# Patient Record
Sex: Female | Born: 1950
Health system: Southern US, Community
[De-identification: ages and names within clinical notes are randomized; demographics above are authoritative.]

## PROBLEM LIST (undated history)

## (undated) DIAGNOSIS — Z9889 Other specified postprocedural states: Secondary | ICD-10-CM

## (undated) DIAGNOSIS — IMO0002 Reserved for concepts with insufficient information to code with codable children: Secondary | ICD-10-CM

## (undated) DIAGNOSIS — K219 Gastro-esophageal reflux disease without esophagitis: Secondary | ICD-10-CM

## (undated) DIAGNOSIS — I10 Essential (primary) hypertension: Secondary | ICD-10-CM

## (undated) DIAGNOSIS — R112 Nausea with vomiting, unspecified: Secondary | ICD-10-CM

## (undated) DIAGNOSIS — D329 Benign neoplasm of meninges, unspecified: Secondary | ICD-10-CM

## (undated) DIAGNOSIS — E119 Type 2 diabetes mellitus without complications: Secondary | ICD-10-CM

## (undated) DIAGNOSIS — F32A Depression, unspecified: Secondary | ICD-10-CM

## (undated) DIAGNOSIS — M797 Fibromyalgia: Secondary | ICD-10-CM

## (undated) DIAGNOSIS — K529 Noninfective gastroenteritis and colitis, unspecified: Secondary | ICD-10-CM

## (undated) HISTORY — PX: APPENDECTOMY: SHX54

## (undated) HISTORY — DX: Gastro-esophageal reflux disease without esophagitis: K21.9

## (undated) HISTORY — PX: TONSILLECTOMY: SUR1361

## (undated) HISTORY — DX: Noninfective gastroenteritis and colitis, unspecified: K52.9

## (undated) HISTORY — PX: OTHER SURGICAL HISTORY: SHX169

## (undated) HISTORY — PX: CHOLECYSTECTOMY: SHX55

## (undated) HISTORY — DX: Reserved for concepts with insufficient information to code with codable children: IMO0002

## (undated) HISTORY — PX: BREAST SURGERY: SHX581

## (undated) HISTORY — PX: ABDOMINAL HYSTERECTOMY: SHX81

## (undated) HISTORY — PX: EYE SURGERY: SHX253

---

## 1997-04-07 HISTORY — PX: OTHER SURGICAL HISTORY: SHX169

## 2000-10-28 ENCOUNTER — Encounter: Payer: Self-pay | Admitting: Family Medicine

## 2000-10-28 ENCOUNTER — Ambulatory Visit (HOSPITAL_COMMUNITY): Admission: RE | Admit: 2000-10-28 | Discharge: 2000-10-28 | Payer: Self-pay | Admitting: Family Medicine

## 2000-12-08 ENCOUNTER — Ambulatory Visit (HOSPITAL_COMMUNITY): Admission: RE | Admit: 2000-12-08 | Discharge: 2000-12-08 | Payer: Self-pay | Admitting: Neurosurgery

## 2000-12-08 ENCOUNTER — Encounter: Payer: Self-pay | Admitting: Neurosurgery

## 2001-01-07 ENCOUNTER — Encounter: Payer: Self-pay | Admitting: Family Medicine

## 2001-01-07 ENCOUNTER — Ambulatory Visit (HOSPITAL_COMMUNITY): Admission: RE | Admit: 2001-01-07 | Discharge: 2001-01-07 | Payer: Self-pay | Admitting: Family Medicine

## 2001-02-01 ENCOUNTER — Encounter (HOSPITAL_COMMUNITY): Admission: RE | Admit: 2001-02-01 | Discharge: 2001-03-03 | Payer: Self-pay | Admitting: Orthopaedic Surgery

## 2001-11-04 ENCOUNTER — Ambulatory Visit (HOSPITAL_COMMUNITY): Admission: RE | Admit: 2001-11-04 | Discharge: 2001-11-04 | Payer: Self-pay | Admitting: Family Medicine

## 2001-11-04 ENCOUNTER — Encounter: Payer: Self-pay | Admitting: Family Medicine

## 2002-11-09 ENCOUNTER — Encounter: Payer: Self-pay | Admitting: General Surgery

## 2002-11-09 ENCOUNTER — Ambulatory Visit (HOSPITAL_COMMUNITY): Admission: RE | Admit: 2002-11-09 | Discharge: 2002-11-09 | Payer: Self-pay | Admitting: General Surgery

## 2002-11-14 ENCOUNTER — Ambulatory Visit (HOSPITAL_COMMUNITY): Admission: RE | Admit: 2002-11-14 | Discharge: 2002-11-14 | Payer: Self-pay | Admitting: Family Medicine

## 2002-11-14 ENCOUNTER — Encounter: Payer: Self-pay | Admitting: Family Medicine

## 2003-05-12 ENCOUNTER — Ambulatory Visit (HOSPITAL_COMMUNITY): Admission: RE | Admit: 2003-05-12 | Discharge: 2003-05-12 | Payer: Self-pay | Admitting: Family Medicine

## 2004-01-10 ENCOUNTER — Ambulatory Visit (HOSPITAL_COMMUNITY): Admission: RE | Admit: 2004-01-10 | Discharge: 2004-01-10 | Payer: Self-pay | Admitting: Family Medicine

## 2004-03-28 ENCOUNTER — Ambulatory Visit (HOSPITAL_COMMUNITY): Admission: RE | Admit: 2004-03-28 | Discharge: 2004-03-28 | Payer: Self-pay | Admitting: Otolaryngology

## 2004-04-07 HISTORY — PX: UPPER GASTROINTESTINAL ENDOSCOPY: SHX188

## 2004-08-27 ENCOUNTER — Ambulatory Visit: Payer: Self-pay | Admitting: Internal Medicine

## 2004-09-06 ENCOUNTER — Ambulatory Visit (HOSPITAL_COMMUNITY): Admission: RE | Admit: 2004-09-06 | Discharge: 2004-09-06 | Payer: Self-pay | Admitting: Internal Medicine

## 2004-09-06 ENCOUNTER — Ambulatory Visit: Payer: Self-pay | Admitting: Internal Medicine

## 2005-01-14 ENCOUNTER — Ambulatory Visit (HOSPITAL_COMMUNITY): Admission: RE | Admit: 2005-01-14 | Discharge: 2005-01-14 | Payer: Self-pay | Admitting: Family Medicine

## 2006-02-23 ENCOUNTER — Ambulatory Visit (HOSPITAL_COMMUNITY): Admission: RE | Admit: 2006-02-23 | Discharge: 2006-02-23 | Payer: Self-pay | Admitting: Family Medicine

## 2007-02-25 ENCOUNTER — Ambulatory Visit (HOSPITAL_COMMUNITY): Admission: RE | Admit: 2007-02-25 | Discharge: 2007-02-25 | Payer: Self-pay | Admitting: Family Medicine

## 2007-03-01 ENCOUNTER — Ambulatory Visit (HOSPITAL_COMMUNITY): Admission: RE | Admit: 2007-03-01 | Discharge: 2007-03-01 | Payer: Self-pay | Admitting: Family Medicine

## 2007-05-05 ENCOUNTER — Ambulatory Visit (HOSPITAL_COMMUNITY): Admission: RE | Admit: 2007-05-05 | Discharge: 2007-05-05 | Payer: Self-pay | Admitting: Family Medicine

## 2007-08-13 ENCOUNTER — Ambulatory Visit (HOSPITAL_COMMUNITY): Admission: RE | Admit: 2007-08-13 | Discharge: 2007-08-13 | Payer: Self-pay | Admitting: Family Medicine

## 2007-11-11 HISTORY — PX: COLONOSCOPY: SHX174

## 2008-03-22 ENCOUNTER — Ambulatory Visit (HOSPITAL_COMMUNITY): Admission: RE | Admit: 2008-03-22 | Discharge: 2008-03-22 | Payer: Self-pay | Admitting: Family Medicine

## 2008-05-24 ENCOUNTER — Encounter: Admission: RE | Admit: 2008-05-24 | Discharge: 2008-05-24 | Payer: Self-pay | Admitting: Neurosurgery

## 2009-05-03 ENCOUNTER — Ambulatory Visit (HOSPITAL_COMMUNITY): Admission: RE | Admit: 2009-05-03 | Discharge: 2009-05-03 | Payer: Self-pay | Admitting: Otolaryngology

## 2009-05-07 ENCOUNTER — Ambulatory Visit (HOSPITAL_COMMUNITY): Admission: RE | Admit: 2009-05-07 | Discharge: 2009-05-07 | Payer: Self-pay | Admitting: Family Medicine

## 2009-05-22 ENCOUNTER — Encounter: Admission: RE | Admit: 2009-05-22 | Discharge: 2009-05-22 | Payer: Self-pay | Admitting: Neurosurgery

## 2010-04-28 ENCOUNTER — Encounter: Payer: Self-pay | Admitting: Family Medicine

## 2010-05-06 ENCOUNTER — Other Ambulatory Visit: Payer: Self-pay | Admitting: Neurosurgery

## 2010-05-06 DIAGNOSIS — D329 Benign neoplasm of meninges, unspecified: Secondary | ICD-10-CM

## 2010-05-10 ENCOUNTER — Other Ambulatory Visit: Payer: Self-pay

## 2010-05-15 ENCOUNTER — Ambulatory Visit
Admission: RE | Admit: 2010-05-15 | Discharge: 2010-05-15 | Disposition: A | Discharge status: Home / Self Care | Payer: BC Managed Care – PPO | Source: Ambulatory Visit | Attending: Neurosurgery | Admitting: Neurosurgery

## 2010-05-15 ENCOUNTER — Other Ambulatory Visit: Payer: Self-pay | Admitting: Neurosurgery

## 2010-05-15 DIAGNOSIS — D329 Benign neoplasm of meninges, unspecified: Secondary | ICD-10-CM

## 2010-05-15 MED ORDER — GADOBENATE DIMEGLUMINE 529 MG/ML IV SOLN
17.0000 mL | Freq: Once | INTRAVENOUS | Status: AC | PRN
  Administered 2010-05-15: 17 mL via INTRAVENOUS

## 2010-05-30 ENCOUNTER — Other Ambulatory Visit (HOSPITAL_COMMUNITY): Payer: Self-pay | Admitting: Family Medicine

## 2010-05-30 DIAGNOSIS — Z139 Encounter for screening, unspecified: Secondary | ICD-10-CM

## 2010-06-03 ENCOUNTER — Ambulatory Visit (HOSPITAL_COMMUNITY): Payer: BC Managed Care – PPO

## 2010-06-17 ENCOUNTER — Other Ambulatory Visit (HOSPITAL_COMMUNITY): Payer: Self-pay | Admitting: Family Medicine

## 2010-06-17 ENCOUNTER — Ambulatory Visit (HOSPITAL_COMMUNITY)
Admission: RE | Admit: 2010-06-17 | Discharge: 2010-06-17 | Disposition: A | Discharge status: Home / Self Care | Payer: BC Managed Care – PPO | Source: Ambulatory Visit | Attending: Family Medicine | Admitting: Family Medicine

## 2010-06-17 ENCOUNTER — Encounter (HOSPITAL_COMMUNITY): Payer: Self-pay

## 2010-06-17 DIAGNOSIS — R0789 Other chest pain: Secondary | ICD-10-CM | POA: Insufficient documentation

## 2010-06-19 ENCOUNTER — Other Ambulatory Visit (HOSPITAL_COMMUNITY): Payer: Self-pay | Admitting: Family Medicine

## 2010-06-19 DIAGNOSIS — R110 Nausea: Secondary | ICD-10-CM

## 2010-06-19 DIAGNOSIS — R197 Diarrhea, unspecified: Secondary | ICD-10-CM

## 2010-06-20 ENCOUNTER — Other Ambulatory Visit (HOSPITAL_COMMUNITY): Payer: Self-pay | Admitting: Family Medicine

## 2010-06-20 DIAGNOSIS — Z139 Encounter for screening, unspecified: Secondary | ICD-10-CM

## 2010-06-20 LAB — CBC AND DIFFERENTIAL
HCT: 40 % (ref 36–46)
Hemoglobin: 13 g/dL (ref 12.0–16.0)
Platelets: 324 10*3/uL (ref 150–399)
WBC: 6 10^3/mL

## 2010-06-20 LAB — BASIC METABOLIC PANEL
Glucose: 116 mg/dL
Potassium: 4.6 mmol/L (ref 3.4–5.3)
Sodium: 139 mmol/L (ref 137–147)

## 2010-06-20 LAB — HEPATIC FUNCTION PANEL
ALT: 10 U/L (ref 7–35)
AST: 11 U/L — AB (ref 13–35)

## 2010-06-21 ENCOUNTER — Ambulatory Visit (HOSPITAL_COMMUNITY)
Admission: RE | Admit: 2010-06-21 | Discharge: 2010-06-21 | Disposition: A | Discharge status: Home / Self Care | Payer: BC Managed Care – PPO | Source: Ambulatory Visit | Attending: Family Medicine | Admitting: Family Medicine

## 2010-06-21 DIAGNOSIS — K5730 Diverticulosis of large intestine without perforation or abscess without bleeding: Secondary | ICD-10-CM | POA: Insufficient documentation

## 2010-06-21 DIAGNOSIS — R110 Nausea: Secondary | ICD-10-CM

## 2010-06-21 DIAGNOSIS — R109 Unspecified abdominal pain: Secondary | ICD-10-CM | POA: Insufficient documentation

## 2010-06-21 DIAGNOSIS — R197 Diarrhea, unspecified: Secondary | ICD-10-CM

## 2010-06-21 MED ORDER — IOHEXOL 300 MG/ML  SOLN
100.0000 mL | Freq: Once | INTRAMUSCULAR | Status: AC | PRN
  Administered 2010-06-21: 100 mL via INTRAVENOUS

## 2010-06-24 ENCOUNTER — Ambulatory Visit (HOSPITAL_COMMUNITY)
Admission: RE | Admit: 2010-06-24 | Discharge: 2010-06-24 | Disposition: A | Discharge status: Home / Self Care | Payer: BC Managed Care – PPO | Source: Ambulatory Visit | Attending: Family Medicine | Admitting: Family Medicine

## 2010-06-24 DIAGNOSIS — Z139 Encounter for screening, unspecified: Secondary | ICD-10-CM

## 2010-06-24 DIAGNOSIS — Z1231 Encounter for screening mammogram for malignant neoplasm of breast: Secondary | ICD-10-CM | POA: Insufficient documentation

## 2010-07-25 ENCOUNTER — Ambulatory Visit (INDEPENDENT_AMBULATORY_CARE_PROVIDER_SITE_OTHER): Payer: BC Managed Care – PPO | Admitting: Internal Medicine

## 2010-07-25 DIAGNOSIS — K219 Gastro-esophageal reflux disease without esophagitis: Secondary | ICD-10-CM

## 2010-08-23 NOTE — Op Note (Signed)
NAME:  Mindy Ellis, Mindy Ellis               ACCOUNT NO.:  [id number]   MEDICAL RECORD NO.:  [id number]          PATIENT TYPE:  AMB   LOCATION:  DAY                           FACILITY:  [facility]   PHYSICIAN:  Lionel December, M.D.    DATE OF BIRTH:  Jan 11, 1951   DATE OF PROCEDURE:  09/06/2004  DATE OF DISCHARGE:                                 OPERATIVE REPORT   PROCEDURE:  Esophagogastroduodenoscopy with esophageal dilation followed by  colonoscopy.   INDICATION:  Carlisia is a 60 year old Caucasian female with a history of GERD  who had laparoscopic Nissen in September 1997 who presents with recurrent  burning in her chest as well as dysphagia. She has had dysphagia off and on  for a year. She also complains of intermittent rectal bleeding. Her last  colonoscopy was in February 2002.   She is undergoing diagnostic evaluation. Procedure risks were reviewed with  the patient, and informed consent was obtained.   PREMEDICATION:  Cetacaine spray for pharyngeal topical anesthesia, Demerol  50 mg IV, Versed 10 mg IV.   FINDINGS:  Procedure performed in endoscopy suite. The patient's vital signs  and O2 saturation were monitored during the procedure and remained stable.   PROCEDURE:  #1.  Esophagogastroduodenoscopy. The patient was placed in left  lateral position and Olympus videoscope was passed via oropharynx without  any difficulty into esophagus.   Esophagus. Mucosa of the esophagus normal. No ring or stricture or erosions  were noted.   Stomach. It was empty and distended very well insufflated. Folds of proximal  stomach were normal. Examination of mucosa revealed multiple  prepyloric/antral erosions with erythematous mucosa surrounding these.  Pyloric channel was patent. Angularis, fundus and cardia examined by  retroflexing the scope and were normal. Fundal wrap was rather short.   Duodenum. Bulbar mucosa was normal. Scope was passed to the second part of  the duodenum where mucosa and  folds were normal. Endoscope was withdrawn.   Esophagus was dilated by passing 56-French Maloney dilator to full  insertion. As the dilator was withdrawn, endoscope was passed again, and  there was no mucosal injury to esophagus. Endoscope was withdrawn and the  patient prepared for procedure #2.   #2.  Colonoscopy. Rectal examination performed. No abnormality noted on  external or digital exam. Olympus videoscope was placed in rectum and  advanced under vision into sigmoid colon beyond. Preparation was excellent.  Scope was passed to the cecum which was identified by appendiceal orifice  and ileocecal valve. Pictures taken for the record. As the scope was  withdrawn, colonic mucosa was once again carefully examined and was normal  throughout. Rectal mucosa was similarly normal. Scope was retroflexed to  examine anorectal junction. Hemorrhoids were noted below the dentate line.  Pictures taken for the record, and endoscope was withdrawn. The patient  tolerated the procedure well.   FINAL DIAGNOSIS:  1.  No endoscopic evidence of reflux, esophagitis, ring, or stricture      formation. Esophagus dilated by passing 56-French Maloney dilator given      history of dysphagia.  2.  Short fundal  wrap.  3.  Erosive antral gastritis which was new since previous exam of August      1997.  4.  Normal colonoscopy except external hemorrhoids.   RECOMMENDATIONS:  1.  Antireflux measures.  2.  Trial with omeprazole 20 mg p.o. q.a.m.  3.  H pylori serology will be checked.       NR/MEDQ  D:  09/06/2004  T:  09/07/2004  Job:  [id number]   cc:   Kirk Ruths, M.D.  [address]  [place]  [place] [address]  Fax: [phone]

## 2010-08-23 NOTE — H&P (Signed)
NAME:  TWYLA, DAIS               ACCOUNT NO.:  [id number]   MEDICAL RECORD NO.:  [id number]          PATIENT TYPE:  AMB   LOCATION:  DAY                           FACILITY:  [facility]   PHYSICIAN:  Lionel December, M.D.    DATE OF BIRTH:  March 18, 1951   DATE OF ADMISSION:  DATE OF DISCHARGE:  LH                                HISTORY & PHYSICAL   PRIMARY CARE PHYSICIAN:  Dr. Regino Schultze.   REASON FOR CONSULTATION:  Rectal bleeding and anorexia, early satiety.   HISTORY OF PRESENT ILLNESS:  Mrs. Schmale is a 60 year old Caucasian female,  a patient of Dr. Edison Simon, who presents today with multiple GI concerns.  First, she describes intermittent rectal bleeding or what she believes to be  intermittent rectal bleeding.  She notes she saw red blood streaks in her  commode on two separate occasions.  She did not notice this after having a  bowel movement, but when she returned to her house later that day she saw  blood in the toilet.  She did not notice any blood on the toilet paper or in  her stools.  She describes what she saw as bright red.  She denies any low  abdominal pain.  She also has a history of chronic GERD.  She reportedly has  had several episodes of dysphagia and choking, especially on solid foods  including spicy foods and peanuts.  She denies any problems with liquids.  She has been having solid food dysphagia for about a year now.  She also  reports early satiety for the last 9 months.  She denies any constipation or  diarrhea.  She has had significant water brash, although she is status post  Nissen fundoplication.  She denies any odynophagia.  She denies any nausea  or vomiting.  She was noted to be anemic.  She does take an occasional Aleve  and has had to take Zantac on an as-needed basis.   PAST MEDICAL HISTORY:  Rosacea, chronic GERD, status post Nissen  fundoplication in September of 1997.  She had a hysterectomy at age 60  followed by a bilateral oophorectomy 7 years  later.  She had a laparoscopic  cholecystectomy in March of 1994.  She has right-sided hearing loss and  mitral valve prolapse.  Total colonoscopy by Dr. Karilyn Cota on May 11, 2000.  She has prominent anal papilla.  Otherwise normal exam.  Also she had an EGD  in August of 1997 which showed mild changes of reflux esophagitis without  evidence of gastritis or peptic ulcer disease.  There was no evidence of  Barrett's.   CURRENT MEDICATIONS:  1.  Ranitidine 75 mg p.r.n.  2.  Multivitamin daily.  3.  Vivelle patch.  4.  Amitriptyline 25 mg daily.  5.  Aleve p.r.n.   ALLERGIES:  SULFA, PENICILLIN, VIOXX, ERYTHROMYCIN, EPIDURAL, TORECAN.   FAMILY HISTORY:  No known family history of colorectal carcinoma, liver or  chronic GI problems.  Mother with a history of diabetes mellitus at age 30.  She has a sister who is relatively healthy.   SOCIAL HISTORY:  Mrs. Dorman has been married for 37 years.  She has two  healthy sons.  She is employed full time at [employer].  She denies any tobacco,  alcohol or drug use.   REVIEW OF SYSTEMS:  CONSTITUTIONAL:  She has changed to a more sedentary  job.  Therefore she reports about a 10-pound weight gain in the last 6  months, although she complains of anorexia.  She denies any fever or chills.  ENDOCRINE:  She notes hair loss and increasing abdominal girth.  She states  her extremities are always cold.  GYN:  She is status post hysterectomy.  PSYCHOSOCIAL:  She denies any problems with depression currently, although  she is somewhat stressed with her husband with PTSD.  She denies any  suicidal or homicidal ideation.  GI:  See HPI.   PHYSICAL EXAMINATION:  VITAL SIGNS:  Weight 118.5 pounds.  Height 64 inches.  Temperature 98.6, blood pressure 114/76, pulse 68.  GENERAL:  Mrs. Prim is a 60 year old Caucasian female who is alert,  oriented, pleasant and cooperative, in no acute distress.  HEENT:  Sclerae are clear and nonicteric.  Conjunctivae pink.   Oropharynx is  pink and moist without any lesions.  NECK:  Supple without any masses or thyromegaly.  CHEST:  Heart has regular rate and rhythm with normal S1, S2.  She did have  a 1 out of 6 murmur noted.  LUNGS:  Clear to auscultation bilaterally.  ABDOMEN:  Flat with positive bowel sounds times four.  No bruit auscultated.  Soft, nontender, nondistended without palpable masses or hepatosplenomegaly.  No rebound tenderness or guarding.  RECTAL:  She does have a friable anal canal with a few external hemorrhoids.  No active bleeding.  Internal exam reveals good sphincter tone.  No internal  mass is palpated.  A small amount of formed stool was obtained from the  vault which is light brown and hemoccult negative.  EXTREMITIES:  Without edema bilaterally.  SKIN:  Pink, warm and dry without any rash or jaundice.   LABORATORY STUDIES:  Vitamin B12 473, folate 11.6, iron 90, TIBC 463, TIBC  percent sat 19, ferritin 14, WBC 7.3, hemoglobin 12.2, hematocrit 41.4,  platelets 461.  Prior CBC from May 21, 2004 revealed a hemoglobin of  11.5 with an MCV of 76.5.  CMP was normal.   ASSESSMENT:  Mrs. Flaum is a 60 year old Caucasian female with multiple GI  complaints today.  1.  Noticing bright red streaks in the toilet which she felt were blood but      without seeing any outright hematochezia.  She does have external      hemorrhoids on exam and could be having hemorrhoidal bleeding.      Colonoscopy was reassuring from 2002.  We will treat her external      hemorrhoids.  However, if she continues to have bleeding, she may need      further evaluation.  2.  As far as her early satiety and intermittent solid food dysphagia is      concerned, she should have an evaluation of her upper GI tract with EGD.      She is status post Nissen fundoplication.  Therefore we should verify      this is intact, given her exacerbation of symptoms as well as to rule     out any complications including  stricture.  As far as her weight gain is      concerned, she would like [place] to check her  TSH today to be sure she does      not have hypothyroidism, and I feel that is appropriate.  3.  She was found to have anemia without evidence of iron deficiency.  This      has resolved as of last CBC from July 24, 2004.  Her hemoglobin was      12.2 with a hematocrit of 41.1 and MCV of 78.7.  She does, however, have      thrombocytosis, which will be rechecked by Dr. Edison Simon office within      the next week or so.   RECOMMENDATIONS:  1.  Prescription was given for Anusol HC cream. She is to apply it b.i.d. to      her rectum for 10 days with no refills.  2.  No further NSAIDs.  3.  TSH will be obtained.  4.  We will schedule an EGD with Dr. Karilyn Cota in the near future.  She will be      given SBE prophylaxis.  If she      continues to have rectal bleeding she is going to need further      evaluation as well.  EGD has been discussed with Mrs. Buster including      risks and benefits to include but not limited to bleeding, infection,      perforation, drug reaction.  She agrees with the plan.  Consent will be      obtained.      KC/MEDQ  D:  08/27/2004  T:  08/27/2004  Job:  [id number]   cc:   Kirk Ruths, M.D.  [address]  [place]  [place] [address]  Fax: [phone]

## 2010-12-18 ENCOUNTER — Encounter (INDEPENDENT_AMBULATORY_CARE_PROVIDER_SITE_OTHER): Payer: Self-pay

## 2011-05-19 ENCOUNTER — Other Ambulatory Visit: Payer: Self-pay | Admitting: Neurosurgery

## 2011-05-19 DIAGNOSIS — R51 Headache: Secondary | ICD-10-CM

## 2011-05-23 ENCOUNTER — Ambulatory Visit
Admission: RE | Admit: 2011-05-23 | Discharge: 2011-05-23 | Disposition: A | Discharge status: Home / Self Care | Payer: BC Managed Care – PPO | Source: Ambulatory Visit | Attending: Neurosurgery | Admitting: Neurosurgery

## 2011-05-23 DIAGNOSIS — R51 Headache: Secondary | ICD-10-CM

## 2011-05-23 MED ORDER — GADOBENATE DIMEGLUMINE 529 MG/ML IV SOLN
17.0000 mL | Freq: Once | INTRAVENOUS | Status: AC | PRN
  Administered 2011-05-23: 17 mL via INTRAVENOUS

## 2011-06-05 ENCOUNTER — Other Ambulatory Visit (HOSPITAL_COMMUNITY): Payer: Self-pay | Admitting: Family Medicine

## 2011-06-05 DIAGNOSIS — Z139 Encounter for screening, unspecified: Secondary | ICD-10-CM

## 2011-06-22 ENCOUNTER — Emergency Department (HOSPITAL_COMMUNITY)
Admission: EM | Admit: 2011-06-22 | Discharge: 2011-06-22 | Disposition: A | Discharge status: Home / Self Care | Payer: BC Managed Care – PPO | Attending: Emergency Medicine | Admitting: Emergency Medicine

## 2011-06-22 ENCOUNTER — Encounter (HOSPITAL_COMMUNITY): Payer: Self-pay | Admitting: *Deleted

## 2011-06-22 DIAGNOSIS — IMO0002 Reserved for concepts with insufficient information to code with codable children: Secondary | ICD-10-CM | POA: Insufficient documentation

## 2011-06-22 DIAGNOSIS — R197 Diarrhea, unspecified: Secondary | ICD-10-CM | POA: Insufficient documentation

## 2011-06-22 DIAGNOSIS — L299 Pruritus, unspecified: Secondary | ICD-10-CM | POA: Insufficient documentation

## 2011-06-22 DIAGNOSIS — Z79899 Other long term (current) drug therapy: Secondary | ICD-10-CM | POA: Insufficient documentation

## 2011-06-22 DIAGNOSIS — K219 Gastro-esophageal reflux disease without esophagitis: Secondary | ICD-10-CM | POA: Insufficient documentation

## 2011-06-22 DIAGNOSIS — L509 Urticaria, unspecified: Secondary | ICD-10-CM | POA: Insufficient documentation

## 2011-06-22 MED ORDER — EPINEPHRINE 0.3 MG/0.3ML IJ DEVI
0.3000 mg | Freq: Once | INTRAMUSCULAR | Status: AC
  Administered 2011-06-22: 0.3 mg via SUBCUTANEOUS
  Filled 2011-06-22: qty 0.3

## 2011-06-22 MED ORDER — PREDNISONE 10 MG PO TABS: 20.0000 mg | ORAL_TABLET | Freq: Two times a day (BID) | ORAL | Status: DC

## 2011-06-22 MED ORDER — HYDROXYZINE HCL 25 MG PO TABS
25.0000 mg | ORAL_TABLET | Freq: Once | ORAL | Status: AC
Start: 2011-06-22 — End: 2011-06-22
  Administered 2011-06-22: 25 mg via ORAL
  Filled 2011-06-22: qty 1

## 2011-06-22 MED ORDER — PREDNISONE 20 MG PO TABS
20.0000 mg | ORAL_TABLET | Freq: Once | ORAL | Status: AC
Start: 2011-06-22 — End: 2011-06-22
  Administered 2011-06-22: 20 mg via ORAL
  Filled 2011-06-22: qty 1

## 2011-06-22 MED ORDER — HYDROXYZINE HCL 25 MG PO TABS: 25.0000 mg | ORAL_TABLET | Freq: Four times a day (QID) | ORAL | Status: AC | PRN

## 2011-06-22 NOTE — ED Notes (Signed)
Reports having rash for several days, is all over body, red, mild itching and pin prick type pain with the rash. Has been to pcp for it on Friday, no relief with benadryl.

## 2011-06-22 NOTE — ED Provider Notes (Signed)
History     CSN: [id number]  Arrival date & time 06/22/11  1101   First MD Initiated Contact with Patient 06/22/11 1149      Chief Complaint  Patient presents with  . Rash    (Consider location/radiation/quality/duration/timing/severity/associated sxs/prior treatment) HPI Comments: Was started on macrobid recently, but this started before the antibiotic was prescribed.  Patient is a 61 y.o. female presenting with rash. The history is provided by the patient.  Rash  This is a new problem. Episode onset: two days ago. The problem has been gradually worsening. The problem is associated with nothing. There has been no fever. Affected Location: all over. The pain has been constant since onset. Associated symptoms include itching. Pertinent negatives include no blisters and no pain. She has tried antihistamines for the symptoms. The treatment provided no relief.    Past Medical History  Diagnosis Date  . GERD (gastroesophageal reflux disease)   . Acid reflux   . Dysphagia   . Chronic diarrhea   . Abdominal pain   . DDD (degenerative disc disease)   . Sleep apnea     Past Surgical History  Procedure Date  . Colonoscopy 11/11/07    NUR  . Upper gastrointestinal endoscopy 2006    History reviewed. No pertinent family history.  History  Substance Use Topics  . Smoking status: Never Smoker   . Smokeless tobacco: Not on file  . Alcohol Use: Yes     occ wine    OB History    Grav Para Term Preterm Abortions TAB SAB Ect Mult Living                  Review of Systems  Skin: Positive for itching and rash.  All other systems reviewed and are negative.    Allergies  Aleve; Ambien; Erythromycin; Mirtazapine; Penicillins; Sulfur; and Tetracyclines & related  Home Medications   Current Outpatient Rx  Name Route Sig Dispense Refill  . ALPRAZOLAM 0.25 MG PO TABS Oral Take 0.25 mg by mouth at bedtime as needed. For sleep    . OMEGA-3 FATTY ACIDS 1000 MG PO CAPS Oral Take 3  g by mouth daily.    Marland Kitchen LORATADINE 10 MG PO TABS Oral Take 10 mg by mouth daily.    . TRAZODONE HCL 100 MG PO TABS Oral Take 100 mg by mouth at bedtime.        BP 131/85  Pulse 95  Temp(Src) 98.9 F (37.2 C) (Oral)  Resp 20  SpO2 96%  Physical Exam  Nursing note and vitals reviewed. Constitutional: She is oriented to person, place, and time. She appears well-developed and well-nourished. No distress.  HENT:  Head: Normocephalic and atraumatic.  Neck: Normal range of motion. Neck supple.  Cardiovascular: Normal rate and regular rhythm.  Exam reveals no gallop and no friction rub.   No murmur heard. Pulmonary/Chest: Effort normal and breath sounds normal. No respiratory distress. She has no wheezes.  Abdominal: Soft. Bowel sounds are normal. She exhibits no distension. There is no tenderness.  Musculoskeletal: Normal range of motion.  Neurological: She is alert and oriented to person, place, and time.  Skin: Skin is warm and dry. She is not diaphoretic.       There is a generalized macular rash present.  It is blanching, raised, pruritic.    ED Course  Procedures (including critical care time)  Labs Reviewed - No data to display No results found.   No diagnosis found.  MDM  Some relief with meds here.  Will prescribe atarax, prednisone, give time.  To return prn.        Geoffery Lyons, MD 06/22/11 1309

## 2011-06-22 NOTE — Discharge Instructions (Signed)

## 2011-06-30 ENCOUNTER — Ambulatory Visit (HOSPITAL_COMMUNITY)
Admission: RE | Admit: 2011-06-30 | Discharge: 2011-06-30 | Disposition: A | Discharge status: Home / Self Care | Payer: BC Managed Care – PPO | Source: Ambulatory Visit | Attending: Family Medicine | Admitting: Family Medicine

## 2011-06-30 DIAGNOSIS — Z1231 Encounter for screening mammogram for malignant neoplasm of breast: Secondary | ICD-10-CM | POA: Insufficient documentation

## 2011-06-30 DIAGNOSIS — Z139 Encounter for screening, unspecified: Secondary | ICD-10-CM

## 2011-12-19 ENCOUNTER — Emergency Department (HOSPITAL_COMMUNITY)
Admission: EM | Admit: 2011-12-19 | Discharge: 2011-12-19 | Disposition: A | Discharge status: Home / Self Care | Payer: Worker's Compensation | Attending: Emergency Medicine | Admitting: Emergency Medicine

## 2011-12-19 ENCOUNTER — Encounter (HOSPITAL_COMMUNITY): Payer: Self-pay | Admitting: *Deleted

## 2011-12-19 ENCOUNTER — Emergency Department (HOSPITAL_COMMUNITY): Payer: Worker's Compensation

## 2011-12-19 DIAGNOSIS — S52121A Displaced fracture of head of right radius, initial encounter for closed fracture: Secondary | ICD-10-CM

## 2011-12-19 DIAGNOSIS — W010XXA Fall on same level from slipping, tripping and stumbling without subsequent striking against object, initial encounter: Secondary | ICD-10-CM | POA: Insufficient documentation

## 2011-12-19 DIAGNOSIS — S52123A Displaced fracture of head of unspecified radius, initial encounter for closed fracture: Secondary | ICD-10-CM | POA: Insufficient documentation

## 2011-12-19 DIAGNOSIS — IMO0002 Reserved for concepts with insufficient information to code with codable children: Secondary | ICD-10-CM | POA: Insufficient documentation

## 2011-12-19 DIAGNOSIS — S0083XA Contusion of other part of head, initial encounter: Secondary | ICD-10-CM

## 2011-12-19 DIAGNOSIS — Y998 Other external cause status: Secondary | ICD-10-CM | POA: Insufficient documentation

## 2011-12-19 DIAGNOSIS — G4730 Sleep apnea, unspecified: Secondary | ICD-10-CM | POA: Insufficient documentation

## 2011-12-19 DIAGNOSIS — S0003XA Contusion of scalp, initial encounter: Secondary | ICD-10-CM | POA: Insufficient documentation

## 2011-12-19 DIAGNOSIS — S0990XA Unspecified injury of head, initial encounter: Secondary | ICD-10-CM | POA: Insufficient documentation

## 2011-12-19 DIAGNOSIS — K219 Gastro-esophageal reflux disease without esophagitis: Secondary | ICD-10-CM | POA: Insufficient documentation

## 2011-12-19 DIAGNOSIS — Y9301 Activity, walking, marching and hiking: Secondary | ICD-10-CM | POA: Insufficient documentation

## 2011-12-19 MED ORDER — HYDROCODONE-ACETAMINOPHEN 5-325 MG PO TABS
1.0000 | ORAL_TABLET | Freq: Once | ORAL | Status: AC
  Administered 2011-12-19: 1 via ORAL

## 2011-12-19 MED ORDER — HYDROCODONE-ACETAMINOPHEN 5-325 MG PO TABS
ORAL_TABLET | ORAL | Status: AC
  Filled 2011-12-19: qty 1

## 2011-12-19 MED ORDER — HYDROCODONE-ACETAMINOPHEN 5-325 MG PO TABS: ORAL_TABLET | ORAL | Status: DC

## 2011-12-19 NOTE — ED Provider Notes (Signed)
History     CSN: [id number]  Arrival date & time 12/19/11  1430   First MD Initiated Contact with Patient 12/19/11 1609      Chief Complaint  Patient presents with  . Fall    (Consider location/radiation/quality/duration/timing/severity/associated sxs/prior treatment) HPI Comments: Slipped on wet pine needles and fell injuring R elbow and striking head on asphalt.  "don't know if i was knocked out.  i don't think so"  Fall unwitnessed.  No neck pain.  No anticoagulant use.  No other injuries or complaints.  Patient is a 61 y.o. female presenting with fall. The history is provided by the patient. No language interpreter was used.  Fall The accident occurred 1 to 2 hours ago. The fall occurred while walking. She landed on concrete. The pain is present in the head and right elbow. The pain is at a severity of 7/10. She was ambulatory at the scene. There was no entrapment after the fall. There was no drug use involved in the accident. There was no alcohol use involved in the accident. Pertinent negatives include no nausea, no vomiting and no headaches. She has tried nothing for the symptoms.    Past Medical History  Diagnosis Date  . GERD (gastroesophageal reflux disease)   . Acid reflux   . Dysphagia   . Chronic diarrhea   . Abdominal pain   . DDD (degenerative disc disease)   . Sleep apnea     Past Surgical History  Procedure Date  . Colonoscopy 11/11/07    NUR  . Upper gastrointestinal endoscopy 2006  . Abdominal hysterectomy   . Appendectomy   . Breast surgery   . Surgery  for acid reflux   . Eye surgery   . Tonsillectomy   . Cholecystectomy     History reviewed. No pertinent family history.  History  Substance Use Topics  . Smoking status: Never Smoker   . Smokeless tobacco: Not on file  . Alcohol Use: Yes     occ wine    OB History    Grav Para Term Preterm Abortions TAB SAB Ect Mult Living                  Review of Systems  HENT: Negative for neck  pain, tinnitus and ear discharge.   Gastrointestinal: Negative for nausea and vomiting.  Musculoskeletal:       Elbow injury   Neurological: Negative for headaches.  Hematological: Does not bruise/bleed easily.  All other systems reviewed and are negative.    Allergies  Erythromycin; Mirtazapine; Naproxen sodium; Penicillins; Sulfur; Tetracyclines & related; and Zolpidem tartrate  Home Medications   Current Outpatient Rx  Name Route Sig Dispense Refill  . ALPRAZOLAM 0.25 MG PO TABS Oral Take 0.25 mg by mouth at bedtime as needed. For sleep    . OMEGA-3 FATTY ACIDS 1000 MG PO CAPS Oral Take 3 g by mouth daily.    Marland Kitchen HYDROCODONE-ACETAMINOPHEN 5-325 MG PO TABS  One tab po q 4-6 hrs prn pain 20 tablet 0  . LORATADINE 10 MG PO TABS Oral Take 10 mg by mouth daily.    Marland Kitchen PREDNISONE 10 MG PO TABS Oral Take 2 tablets (20 mg total) by mouth 2 (two) times daily. 12 tablet 0  . TRAZODONE HCL 100 MG PO TABS Oral Take 100 mg by mouth at bedtime.        BP 124/77  Pulse 92  Temp 98.4 F (36.9 C) (Oral)  Ht 5\' 4"  (1.626  m)  Wt 168 lb (76.204 kg)  BMI 28.84 kg/m2  SpO2 100%  Physical Exam  Nursing note and vitals reviewed. Constitutional: She is oriented to person, place, and time. Vital signs are normal. She appears well-developed and well-nourished. She is cooperative.  Non-toxic appearance. She does not have a sickly appearance. She does not appear ill. No distress.  HENT:  Head: Normocephalic and atraumatic.    Right Ear: Hearing, tympanic membrane, external ear and ear canal normal. No hemotympanum.  Left Ear: Hearing, tympanic membrane, external ear and ear canal normal. No hemotympanum.  Eyes: Conjunctivae normal, EOM and lids are normal. Pupils are equal, round, and reactive to light. Right eye exhibits normal extraocular motion and no nystagmus. Left eye exhibits normal extraocular motion and no nystagmus.  Neck: Normal range of motion. No spinous process tenderness and no muscular  tenderness present. Normal range of motion present.  Cardiovascular: Normal rate, regular rhythm and normal heart sounds.   Pulmonary/Chest: Effort normal and breath sounds normal.  Abdominal: Soft. She exhibits no distension. There is no tenderness.  Musculoskeletal:       Right elbow: She exhibits decreased range of motion. She exhibits no swelling, no effusion and no deformity. tenderness found. Radial head tenderness noted.  Neurological: She is alert and oriented to person, place, and time. No cranial nerve deficit. Coordination normal.  Skin: Skin is warm and dry.  Psychiatric: She has a normal mood and affect. Judgment normal.    ED Course  Procedures (including critical care time)  Labs Reviewed - No data to display Dg Elbow Complete Right  12/19/2011  *RADIOLOGY REPORT*  Clinical Data: Fall, pain  RIGHT ELBOW - COMPLETE 3+ VIEW  Comparison: None.  Findings: Nondisplaced radial head fracture is present. A joint effusion is noted.  Distal humerus, proximal ulna are intact.  IMPRESSION: Nondisplaced radial head fracture.   Original Report Authenticated By: Elsie Stain, M.D.    Dg Wrist Complete Right  12/19/2011  *RADIOLOGY REPORT*  Clinical Data: Larey Seat, pain  RIGHT WRIST - COMPLETE 3+ VIEW  Comparison: None.  Findings: There is no evidence of fracture or dislocation.  There is no evidence of arthropathy or other focal bony abnormality. Soft tissues are unremarkable.  IMPRESSION: No acute findings.   Original Report Authenticated By: Elsie Stain, M.D.    Ct Head Wo Contrast  12/19/2011  *RADIOLOGY REPORT*  Clinical Data: Fall and hit forehead.  CT HEAD WITHOUT CONTRAST  Technique:  Contiguous axial images were obtained from the base of the skull through the vertex without contrast.  Comparison: Brain MR 05/23/2011  Findings: There is a partially calcified dural based lesion in the left frontal parasagittal region.  Based on the previous MRI, this is consistent with a meningioma.   Again seen are small areas of fat along the falx.  There is no evidence for acute hemorrhage, midline shift, hydrocephalus or large infarct.  The paranasal sinuses and visualized mastoid air cells are clear.  No evidence for acute fracture.  IMPRESSION: No acute intracranial abnormality.  Left parasagittal meningioma.   Original Report Authenticated By: Richarda Overlie, M.D.      1. Forehead contusion   2. Head trauma   3. Right radial head fracture       MDM  Sugar tong splint Ice, sling rx-hydrocodone, 20 F/u with dr. Stana Bunting, PA 12/19/11 1715

## 2011-12-19 NOTE — ED Notes (Signed)
Pt stated she slipped and  Larey Seat today while walking at work. Has abrasions to bil arms, bil knees and forehead.   Has limited movement to right elbow, swelling noted. Unsure of loc, stated that "its all a blurr", denies any neck, or back pain. Alert and oriented, able to answer all ?'s.

## 2011-12-19 NOTE — ED Notes (Signed)
Slipped on sidewalk, No LOC, contusion to forehead, abrasions to both arms and rt knee.  Pain rt forearm.No neck pain.  Good rt radial pulse.

## 2011-12-19 NOTE — ED Notes (Signed)
Pt given graham crackers and diet coke, also paper scrub top to wear home. Awaiting ride to home.

## 2011-12-20 NOTE — ED Provider Notes (Signed)
Medical screening examination/treatment/procedure(s) were performed by non-physician practitioner and as supervising physician I was immediately available for consultation/collaboration.  Tobin Chad, MD 12/20/11 [phone]

## 2012-02-25 ENCOUNTER — Ambulatory Visit (HOSPITAL_COMMUNITY): Payer: Self-pay | Admitting: Specialist

## 2012-03-03 ENCOUNTER — Other Ambulatory Visit (HOSPITAL_COMMUNITY): Payer: Self-pay | Admitting: Family Medicine

## 2012-03-03 ENCOUNTER — Ambulatory Visit (HOSPITAL_COMMUNITY)
Admission: RE | Admit: 2012-03-03 | Discharge: 2012-03-03 | Disposition: A | Discharge status: Home / Self Care | Payer: BC Managed Care – PPO | Source: Ambulatory Visit | Attending: Family Medicine | Admitting: Family Medicine

## 2012-03-03 DIAGNOSIS — X58XXXA Exposure to other specified factors, initial encounter: Secondary | ICD-10-CM | POA: Insufficient documentation

## 2012-03-03 DIAGNOSIS — S7000XA Contusion of unspecified hip, initial encounter: Secondary | ICD-10-CM

## 2012-04-20 ENCOUNTER — Other Ambulatory Visit: Payer: Self-pay | Admitting: Orthopedic Surgery

## 2012-04-20 DIAGNOSIS — M542 Cervicalgia: Secondary | ICD-10-CM

## 2012-04-26 ENCOUNTER — Other Ambulatory Visit: Payer: BC Managed Care – PPO

## 2012-07-08 ENCOUNTER — Other Ambulatory Visit: Payer: Self-pay | Admitting: Neurosurgery

## 2012-07-08 DIAGNOSIS — D329 Benign neoplasm of meninges, unspecified: Secondary | ICD-10-CM

## 2012-07-16 ENCOUNTER — Ambulatory Visit
Admission: RE | Admit: 2012-07-16 | Discharge: 2012-07-16 | Disposition: A | Discharge status: Home / Self Care | Payer: BC Managed Care – PPO | Source: Ambulatory Visit | Attending: Neurosurgery | Admitting: Neurosurgery

## 2012-07-16 DIAGNOSIS — D329 Benign neoplasm of meninges, unspecified: Secondary | ICD-10-CM

## 2012-07-16 MED ORDER — GADOBENATE DIMEGLUMINE 529 MG/ML IV SOLN
15.0000 mL | Freq: Once | INTRAVENOUS | Status: AC | PRN
  Administered 2012-07-16: 15 mL via INTRAVENOUS

## 2012-08-05 ENCOUNTER — Other Ambulatory Visit (HOSPITAL_COMMUNITY): Payer: Self-pay | Admitting: Family Medicine

## 2012-08-05 DIAGNOSIS — Z139 Encounter for screening, unspecified: Secondary | ICD-10-CM

## 2012-08-16 ENCOUNTER — Ambulatory Visit (HOSPITAL_COMMUNITY)
Admission: RE | Admit: 2012-08-16 | Discharge: 2012-08-16 | Disposition: A | Discharge status: Home / Self Care | Payer: BC Managed Care – PPO | Source: Ambulatory Visit | Attending: Family Medicine | Admitting: Family Medicine

## 2012-08-16 DIAGNOSIS — Z139 Encounter for screening, unspecified: Secondary | ICD-10-CM

## 2012-08-16 DIAGNOSIS — Z1231 Encounter for screening mammogram for malignant neoplasm of breast: Secondary | ICD-10-CM | POA: Insufficient documentation

## 2012-11-29 ENCOUNTER — Other Ambulatory Visit: Payer: Self-pay | Admitting: Urology

## 2012-12-31 ENCOUNTER — Encounter (HOSPITAL_COMMUNITY): Payer: Self-pay | Admitting: Pharmacy Technician

## 2013-01-10 ENCOUNTER — Encounter (HOSPITAL_COMMUNITY)
Admission: RE | Admit: 2013-01-10 | Discharge: 2013-01-10 | Disposition: A | Discharge status: Home / Self Care | Payer: BC Managed Care – PPO | Source: Ambulatory Visit | Attending: Urology | Admitting: Urology

## 2013-01-10 ENCOUNTER — Encounter (HOSPITAL_COMMUNITY): Payer: Self-pay

## 2013-01-10 DIAGNOSIS — Z01812 Encounter for preprocedural laboratory examination: Secondary | ICD-10-CM | POA: Insufficient documentation

## 2013-01-10 HISTORY — DX: Essential (primary) hypertension: I10

## 2013-01-10 HISTORY — DX: Type 2 diabetes mellitus without complications: E11.9

## 2013-01-10 HISTORY — DX: Nausea with vomiting, unspecified: R11.2

## 2013-01-10 HISTORY — DX: Fibromyalgia: M79.7

## 2013-01-10 HISTORY — DX: Benign neoplasm of meninges, unspecified: D32.9

## 2013-01-10 HISTORY — DX: Other specified postprocedural states: Z98.890

## 2013-01-10 LAB — PROTIME-INR
INR: 0.96 (ref 0.00–1.49)
Prothrombin Time: 12.6 seconds (ref 11.6–15.2)

## 2013-01-10 LAB — CBC
HCT: 41.2 % (ref 36.0–46.0)
Hemoglobin: 13.5 g/dL (ref 12.0–15.0)
MCH: 28.3 pg (ref 26.0–34.0)
MCHC: 32.8 g/dL (ref 30.0–36.0)
MCV: 86.4 fL (ref 78.0–100.0)
Platelets: 375 10*3/uL (ref 150–400)
RBC: 4.77 MIL/uL (ref 3.87–5.11)
RDW: 13.1 % (ref 11.5–15.5)
WBC: 6.7 10*3/uL (ref 4.0–10.5)

## 2013-01-10 LAB — BASIC METABOLIC PANEL
BUN: 17 mg/dL (ref 6–23)
CO2: 31 mEq/L (ref 19–32)
Calcium: 9.8 mg/dL (ref 8.4–10.5)
Chloride: 98 mEq/L (ref 96–112)
Creatinine, Ser: 0.91 mg/dL (ref 0.50–1.10)
GFR calc Af Amer: 76 mL/min — ABNORMAL LOW (ref >90)
GFR calc non Af Amer: 65 mL/min — ABNORMAL LOW (ref >90)
Glucose, Bld: 127 mg/dL — ABNORMAL HIGH (ref 70–99)
Potassium: 3.5 mEq/L (ref 3.5–5.1)
Sodium: 136 mEq/L (ref 135–145)

## 2013-01-10 LAB — TYPE AND SCREEN
ABO/RH(D): A POS
Antibody Screen: NEGATIVE

## 2013-01-10 LAB — ABO/RH: ABO/RH(D): A POS

## 2013-01-10 LAB — APTT: aPTT: 25 seconds (ref 24–37)

## 2013-01-10 NOTE — Patient Instructions (Addendum)
20 Mindy Ellis  01/10/2013   Your procedure is scheduled on: 01-18-2013  Report to [facility] at  515 AM.  Call this number if you have problems the morning of surgery [phone]   Remember:fleets enema night before surgery.   Do not eat food or drink liquids :After Midnight.     Take these medicines the morning of surgery with A SIP OF WATER: cymbalta, claritin                                [facility] PREPARING FOR SURGERY SHEET             You may not have any metal on your body including hair pins and piercings  Do not wear jewelry, make-up.  Do not wear lotions, powders, or perfumes. You may wear deodorant.   Men may shave face and neck.  Do not bring valuables to the hospital. [facility] IS NOT RESPONSIBLE FOR VALUEABLES.  Contacts, dentures or bridgework may not be worn into surgery.  Leave suitcase in the car. After surgery it may be brought to your room.  For patients admitted to the hospital, checkout time is 11:00 AM the day of discharge.   Patients discharged the day of surgery will not be allowed to drive home.  Name and phone number of your driver:  Special Instructions: N/A   Please read over the following fact sheets that you were given: blood fact  sheet  Call [name] RN pre op nurse if needed [phone]    FAILURE TO FOLLOW THESE INSTRUCTIONS MAY RESULT IN THE CANCELLATION OF YOUR SURGERY.  PATIENT SIGNATURE___________________________________________  NURSE SIGNATURE_____________________________________________

## 2013-01-10 NOTE — Progress Notes (Addendum)
LOV NOTE DR Venetia Maxon 08-20-12 ON CHART SPOKE WITH [facility] NO EKG INTERPRETATION OR CHEST XRAY, WILL DO EKG DAY OF SURGERY AND CHEST XRAY

## 2013-01-10 NOTE — Progress Notes (Signed)
Brain mri 07-17-12 epic

## 2013-01-17 MED ORDER — GENTAMICIN SULFATE 40 MG/ML IJ SOLN
320.0000 mg | INTRAMUSCULAR | Status: AC
  Administered 2013-01-18: 320 mg via INTRAVENOUS
  Filled 2013-01-17 (×2): qty 8

## 2013-01-17 NOTE — H&P (Signed)
History of Present Illness   Mindy Ellis has mild stress incontinence with exercise and rarely leaks with coughing. She has foot-on-the-floor syndrome in the morning but not otherwise during the day. She has mild frequency. She has pelvic organ prolapse. On pelvic examination she had grade 2 cystocele with central defect and her vaginal cuff descended 8 to 5 or 6 cm. She had a grade 2 rectocele. I was not convinced she had an enterocele. She had a little bit of atrophy. She had a negative cough test. She has been cleared with cystoscopy and upper tract x-ray for his chronic cystitis by Dr. Margarita Grizzle. She has recurrent urinary tract infections with 5 or 6 per year. I thought if she ever had surgery, she likely benefit from a transvaginal vault suspension, cystocele repair and graft, and probable rectocele repair. I thought she had a little bit of vaginal shortening now and I want to recommend pretreatment Estrace. She did have some deficiency of her posterior fourchette.   On urodynamics, she did not void and was catheterized for 75 mL. Her bladder capacity was 800 mL. Bladder was unstable reaching a pressure of 5 cmH2O but she did not leak. She did not leak with a Valsalva pressure of 173 cmH2O. During voluntary voiding, she voided 800 mL with a maximum flow of 23 mL/sec. Maximum voiding pressure is 22 cmH2O. She emptied efficiently. EMG activity was normal. Bladder neck descended 2-3 cm with a modest cystocele fluoroscopically with mild trabeculation. Her bladder was hyposensitive. The details of the urodynamics are signed and dictated on the urodynamic sheet.   Review of Systems: No change in bowel or neurologic systems.    Past Medical History Problems  1. History of  Depression 311 2. History of  Diabetes Mellitus 250.00 3. History of  Esophageal Reflux 530.81 4. History of  Hypercholesterolemia 272.0 5. History of  Hypertension 401.9 6. History of  Murmurs 785.2  Surgical History Problems   1. History of  Appendectomy 2. History of  Breast Surgery Lumpectomy 3. History of  Corneal LASIK Bilateral Bilateral 4. History of  Esophagogastric Fundoplasty Nissen Fundoplication 5. History of  Gallbladder Surgery 6. History of  Hysterectomy V45.77 7. History of  Oophorectomy - Bilateral (Removal Of Both Ovaries) 8. History of  Tonsillectomy  Current Meds 1. Align CAPS; Therapy: (Recorded:27May2014) to 2. ALPRAZolam 0.5 MG Oral Tablet; Therapy: 27Mar2014 to 3. Azithromycin 250 MG Oral Tablet; Therapy: 30Jun2014 to 4. Cymbalta 60 MG Oral Capsule Delayed Release Particles; Therapy: 29May2013 to 5. Fluticasone Propionate 50 MCG/ACT Nasal Suspension; Therapy: 01Jul2014 to 6. Hydrochlorothiazide 25 MG Oral Tablet; Therapy: (Recorded:27May2014) to 7. TraZODone HCl 100 MG Oral Tablet; Therapy: 20Mar2014 to 8. Vitamin D TABS; Therapy: (Recorded:19Dec2008) to  Allergies Medication  1. Cipro TABS 2. Erythromycin Base POWD 3. Penicillins 4. Sulfa Drugs  Family History Problems  1. Maternal history of  Diabetes Mellitus V18.0 2. Family history of  Family Health Status Number Of Children 2 sons  Social History Problems  1. Alcohol Use occ glass of wine 2. Caffeine Use 2 a day 3. Marital History - Currently Married 4. 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 Tobacco Use  Results/Data     Assessment Assessed  1. Chronic Cystitis 595.2 2. Urge And Stress Incontinence 788.33  Plan   Discussion/Summary   I drew Ms. Markson a picture. I went over watchful waiting versus prolapse surgery versus a pessary.   I drew her a picture and we talked about prolapse surgery in detail. Pros, cons,  general surgical and anesthetic risks, and other options including behavioral therapy, pessaries, and watchful waiting were discussed. She understands that prolapse repairs are successful in 80-85% of cases for prolapse symptoms and can recur anteriorly, posteriorly, and/or apically. She  understands that in most cases I use a graft and general risks were discussed. Surgical risks were described but not limited to the discussion of injury to neighboring structures including the bowel (with possible life-threatening sepsis and colostomy), bladder, urethra, vagina (all resulting in further surgery), and ureter (resulting in re-implantation). We talked about injury to nerves/soft tissue leading to debilitating and intractable pelvic, abdominal, and lower extremity pain syndromes and neuropathies. The risks of buttock pain, intractable dyspareunia, and vaginal narrowing and shortening with sequelae were discussed. Bleeding risks, transfusion rates, and infection were discussed. The risk of persistent, de novo, or worsening bladder and/or bowel incontinence/dysfunction was discussed. The need for CIC was described as well the usual post-operative course. The patient understands that she might not reach her treatment goal and that she might be worse following surgery.  Mesh issues were discussed.  Foot-on-the-floor syndrome and mild stress incontinence were discussed. I did not recommend a sling. She understands the risk of worsening incontinence is 5% or greater.   She does have vaginal dryness. The pathophysiology of chronic cystitis discussed. She gets vaginal atrophy and we talked about healthy tissues preoperatively.   We are going to do surgery in approximately 2 months. She will have Estrace cream with my usual protocol and it can be stopped postoperatively. I would like her to stay on Macrodantin for at least a year. She has allergies. We will proceed accordingly.   To clarify, she is going to get a vault prolapse repair and cystocele repair and graft, and rectocele repair.  After a thorough review of the management options for the patient's condition the patient  elected to proceed with surgical therapy as noted above. We have discussed the potential benefits and risks of the  procedure, side effects of the proposed treatment, the likelihood of the patient achieving the goals of the procedure, and any potential problems that might occur during the procedure or recuperation. Informed consent has been obtained.

## 2013-01-18 ENCOUNTER — Encounter (HOSPITAL_COMMUNITY)
Admission: RE | Disposition: A | Discharge status: Home / Self Care | Payer: Self-pay | Source: Ambulatory Visit | Attending: Urology

## 2013-01-18 ENCOUNTER — Encounter (HOSPITAL_COMMUNITY): Payer: Self-pay | Admitting: *Deleted

## 2013-01-18 ENCOUNTER — Encounter (HOSPITAL_COMMUNITY): Payer: BC Managed Care – PPO | Admitting: Anesthesiology

## 2013-01-18 ENCOUNTER — Ambulatory Visit (HOSPITAL_COMMUNITY): Payer: BC Managed Care – PPO

## 2013-01-18 ENCOUNTER — Observation Stay (HOSPITAL_COMMUNITY)
Admission: RE | Admit: 2013-01-18 | Discharge: 2013-01-19 | Disposition: A | Discharge status: Home / Self Care | Payer: BC Managed Care – PPO | Source: Ambulatory Visit | Attending: Urology | Admitting: Urology

## 2013-01-18 ENCOUNTER — Ambulatory Visit (HOSPITAL_COMMUNITY): Payer: BC Managed Care – PPO | Admitting: Anesthesiology

## 2013-01-18 DIAGNOSIS — E7800 Pure hypercholesterolemia, unspecified: Secondary | ICD-10-CM | POA: Insufficient documentation

## 2013-01-18 DIAGNOSIS — N816 Rectocele: Secondary | ICD-10-CM

## 2013-01-18 DIAGNOSIS — N993 Prolapse of vaginal vault after hysterectomy: Principal | ICD-10-CM | POA: Insufficient documentation

## 2013-01-18 DIAGNOSIS — K219 Gastro-esophageal reflux disease without esophagitis: Secondary | ICD-10-CM | POA: Insufficient documentation

## 2013-01-18 DIAGNOSIS — R159 Full incontinence of feces: Secondary | ICD-10-CM | POA: Insufficient documentation

## 2013-01-18 DIAGNOSIS — I10 Essential (primary) hypertension: Secondary | ICD-10-CM | POA: Insufficient documentation

## 2013-01-18 DIAGNOSIS — N3020 Other chronic cystitis without hematuria: Secondary | ICD-10-CM | POA: Insufficient documentation

## 2013-01-18 DIAGNOSIS — N3946 Mixed incontinence: Secondary | ICD-10-CM | POA: Insufficient documentation

## 2013-01-18 DIAGNOSIS — Z79899 Other long term (current) drug therapy: Secondary | ICD-10-CM | POA: Insufficient documentation

## 2013-01-18 DIAGNOSIS — N8111 Cystocele, midline: Secondary | ICD-10-CM

## 2013-01-18 DIAGNOSIS — E119 Type 2 diabetes mellitus without complications: Secondary | ICD-10-CM | POA: Insufficient documentation

## 2013-01-18 HISTORY — PX: ANTERIOR AND POSTERIOR REPAIR: SHX5121

## 2013-01-18 HISTORY — PX: PROCTOSCOPY: SHX2266

## 2013-01-18 HISTORY — PX: CYSTOSCOPY: SHX5120

## 2013-01-18 LAB — TYPE AND SCREEN
ABO/RH(D): A POS
Antibody Screen: NEGATIVE

## 2013-01-18 LAB — GLUCOSE, CAPILLARY
Glucose-Capillary: 127 mg/dL — ABNORMAL HIGH (ref 70–99)
Glucose-Capillary: 102 mg/dL — ABNORMAL HIGH (ref 70–99)

## 2013-01-18 LAB — HEMOGLOBIN AND HEMATOCRIT, BLOOD
HCT: 36.6 % (ref 36.0–46.0)
Hemoglobin: 12.1 g/dL (ref 12.0–15.0)

## 2013-01-18 SURGERY — CYSTOSCOPY
Anesthesia: General | Wound class: Clean Contaminated

## 2013-01-18 MED ORDER — CIPROFLOXACIN HCL 250 MG PO TABS: 250.0000 mg | ORAL_TABLET | Freq: Two times a day (BID) | ORAL | Status: DC

## 2013-01-18 MED ORDER — HYDROMORPHONE HCL PF 1 MG/ML IJ SOLN
0.2500 mg | INTRAMUSCULAR | Status: DC | PRN
  Administered 2013-01-18 (×2): 0.5 mg via INTRAVENOUS

## 2013-01-18 MED ORDER — LACTATED RINGERS IV SOLN
INTRAVENOUS | Status: DC | PRN
  Administered 2013-01-18 (×2): via INTRAVENOUS

## 2013-01-18 MED ORDER — MIDAZOLAM HCL 5 MG/5ML IJ SOLN
INTRAMUSCULAR | Status: DC | PRN
  Administered 2013-01-18: 2 mg via INTRAVENOUS

## 2013-01-18 MED ORDER — DEXAMETHASONE SODIUM PHOSPHATE 4 MG/ML IJ SOLN
INTRAMUSCULAR | Status: DC | PRN
  Administered 2013-01-18: 10 mg via INTRAVENOUS

## 2013-01-18 MED ORDER — METRONIDAZOLE 500 MG PO TABS: 500.0000 mg | ORAL_TABLET | Freq: Three times a day (TID) | ORAL | Status: DC

## 2013-01-18 MED ORDER — HYDROCODONE-ACETAMINOPHEN 5-325 MG PO TABS: 1.0000 | ORAL_TABLET | Freq: Four times a day (QID) | ORAL | Status: DC | PRN

## 2013-01-18 MED ORDER — FENTANYL CITRATE 0.05 MG/ML IJ SOLN
INTRAMUSCULAR | Status: DC | PRN
  Administered 2013-01-18 (×3): 50 ug via INTRAVENOUS

## 2013-01-18 MED ORDER — STERILE WATER FOR IRRIGATION IR SOLN
Status: DC | PRN
  Administered 2013-01-18: 3000 mL

## 2013-01-18 MED ORDER — LIDOCAINE HCL (CARDIAC) 20 MG/ML IV SOLN
INTRAVENOUS | Status: DC | PRN
  Administered 2013-01-18: 100 mg via INTRAVENOUS

## 2013-01-18 MED ORDER — DULOXETINE HCL 60 MG PO CPEP
60.0000 mg | ORAL_CAPSULE | Freq: Every morning | ORAL | Status: DC
  Administered 2013-01-19: 60 mg via ORAL
  Filled 2013-01-18: qty 1

## 2013-01-18 MED ORDER — ACETAMINOPHEN 325 MG PO TABS: 650.0000 mg | ORAL_TABLET | ORAL | Status: DC | PRN

## 2013-01-18 MED ORDER — LIDOCAINE-EPINEPHRINE (PF) 1 %-1:200000 IJ SOLN
INTRAMUSCULAR | Status: DC | PRN
  Administered 2013-01-18: 24 mL

## 2013-01-18 MED ORDER — METHYLENE BLUE 1 % INJ SOLN
INTRAMUSCULAR | Status: AC
  Filled 2013-01-18: qty 10

## 2013-01-18 MED ORDER — GLYCOPYRROLATE 0.2 MG/ML IJ SOLN
INTRAMUSCULAR | Status: DC | PRN
  Administered 2013-01-18: 0.4 mg via INTRAVENOUS

## 2013-01-18 MED ORDER — TRAZODONE HCL 100 MG PO TABS
100.0000 mg | ORAL_TABLET | Freq: Every day | ORAL | Status: DC
  Administered 2013-01-18: 100 mg via ORAL
  Filled 2013-01-18 (×2): qty 1

## 2013-01-18 MED ORDER — METRONIDAZOLE IN NACL 5-0.79 MG/ML-% IV SOLN: 500.0000 mg | Freq: Three times a day (TID) | INTRAVENOUS | Status: DC

## 2013-01-18 MED ORDER — FLEET ENEMA 7-19 GM/118ML RE ENEM: 1.0000 | ENEMA | Freq: Once | RECTAL | Status: DC

## 2013-01-18 MED ORDER — SODIUM CHLORIDE 0.9 % IR SOLN
Status: DC | PRN
  Administered 2013-01-18: 08:00:00

## 2013-01-18 MED ORDER — HYDROCODONE-ACETAMINOPHEN 5-325 MG PO TABS
1.0000 | ORAL_TABLET | ORAL | Status: DC | PRN
  Administered 2013-01-18 (×3): 1 via ORAL
  Administered 2013-01-19 (×2): 2 via ORAL
  Filled 2013-01-18: qty 1
  Filled 2013-01-18: qty 2
  Filled 2013-01-18 (×4): qty 1

## 2013-01-18 MED ORDER — MEPERIDINE HCL 50 MG/ML IJ SOLN: 6.2500 mg | INTRAMUSCULAR | Status: DC | PRN

## 2013-01-18 MED ORDER — SUCCINYLCHOLINE CHLORIDE 20 MG/ML IJ SOLN
INTRAMUSCULAR | Status: DC | PRN
  Administered 2013-01-18: 100 mg via INTRAVENOUS

## 2013-01-18 MED ORDER — NEOSTIGMINE METHYLSULFATE 1 MG/ML IJ SOLN
INTRAMUSCULAR | Status: DC | PRN
  Administered 2013-01-18: 4 mg via INTRAVENOUS

## 2013-01-18 MED ORDER — ONDANSETRON HCL 4 MG/2ML IJ SOLN
INTRAMUSCULAR | Status: DC | PRN
  Administered 2013-01-18: 4 mg via INTRAMUSCULAR

## 2013-01-18 MED ORDER — VITAMINS A & D EX OINT
TOPICAL_OINTMENT | CUTANEOUS | Status: AC
  Administered 2013-01-18: 12:00:00
  Filled 2013-01-18: qty 5

## 2013-01-18 MED ORDER — METRONIDAZOLE IN NACL 5-0.79 MG/ML-% IV SOLN
500.0000 mg | Freq: Once | INTRAVENOUS | Status: AC
  Administered 2013-01-18: 500 mg via INTRAVENOUS
  Filled 2013-01-18: qty 100

## 2013-01-18 MED ORDER — PROMETHAZINE HCL 25 MG/ML IJ SOLN: 6.2500 mg | INTRAMUSCULAR | Status: DC | PRN

## 2013-01-18 MED ORDER — METRONIDAZOLE IN NACL 5-0.79 MG/ML-% IV SOLN
INTRAVENOUS | Status: AC
  Filled 2013-01-18: qty 100

## 2013-01-18 MED ORDER — ESTRADIOL 0.1 MG/GM VA CREA
TOPICAL_CREAM | VAGINAL | Status: AC
  Filled 2013-01-18: qty 85

## 2013-01-18 MED ORDER — OXYCODONE HCL 5 MG PO TABS: 5.0000 mg | ORAL_TABLET | Freq: Once | ORAL | Status: DC | PRN

## 2013-01-18 MED ORDER — ALPRAZOLAM 0.25 MG PO TABS
0.2500 mg | ORAL_TABLET | Freq: Every evening | ORAL | Status: DC | PRN
  Administered 2013-01-18: 0.25 mg via ORAL
  Filled 2013-01-18: qty 1

## 2013-01-18 MED ORDER — HYDROCHLOROTHIAZIDE 25 MG PO TABS
25.0000 mg | ORAL_TABLET | Freq: Every morning | ORAL | Status: DC
  Administered 2013-01-18 – 2013-01-19 (×2): 25 mg via ORAL
  Filled 2013-01-18 (×2): qty 1

## 2013-01-18 MED ORDER — OXYCODONE HCL 5 MG/5ML PO SOLN
5.0000 mg | Freq: Once | ORAL | Status: DC | PRN
  Filled 2013-01-18: qty 5

## 2013-01-18 MED ORDER — PROPOFOL 10 MG/ML IV BOLUS
INTRAVENOUS | Status: DC | PRN
  Administered 2013-01-18: 120 mg via INTRAVENOUS

## 2013-01-18 MED ORDER — DEXTROSE-NACL 5-0.45 % IV SOLN
INTRAVENOUS | Status: DC
  Administered 2013-01-18 – 2013-01-19 (×3): via INTRAVENOUS

## 2013-01-18 MED ORDER — METRONIDAZOLE IN NACL 5-0.79 MG/ML-% IV SOLN
500.0000 mg | Freq: Three times a day (TID) | INTRAVENOUS | Status: DC
  Administered 2013-01-18 – 2013-01-19 (×4): 500 mg via INTRAVENOUS
  Filled 2013-01-18 (×5): qty 100

## 2013-01-18 MED ORDER — LIDOCAINE-EPINEPHRINE (PF) 1 %-1:200000 IJ SOLN
INTRAMUSCULAR | Status: AC
  Filled 2013-01-18: qty 20

## 2013-01-18 MED ORDER — ESTRADIOL 0.1 MG/GM VA CREA
TOPICAL_CREAM | VAGINAL | Status: DC | PRN
  Administered 2013-01-18: 1 via VAGINAL

## 2013-01-18 MED ORDER — INDIGOTINDISULFONATE SODIUM 8 MG/ML IJ SOLN
INTRAMUSCULAR | Status: DC | PRN
  Administered 2013-01-18: 5 mL via INTRAVENOUS

## 2013-01-18 MED ORDER — HYDROMORPHONE HCL PF 1 MG/ML IJ SOLN
INTRAMUSCULAR | Status: AC
  Filled 2013-01-18: qty 1

## 2013-01-18 MED ORDER — ROCURONIUM BROMIDE 100 MG/10ML IV SOLN
INTRAVENOUS | Status: DC | PRN
  Administered 2013-01-18: 10 mg via INTRAVENOUS
  Administered 2013-01-18: 40 mg via INTRAVENOUS

## 2013-01-18 MED ORDER — MORPHINE SULFATE 2 MG/ML IJ SOLN: 2.0000 mg | INTRAMUSCULAR | Status: DC | PRN

## 2013-01-18 SURGICAL SUPPLY — 66 items
ADH SKN CLS APL DERMABOND .7 (GAUZE/BANDAGES/DRESSINGS)
BAG URINE DRAINAGE (UROLOGICAL SUPPLIES) ×3 IMPLANT
BLADE HEX COATED 2.75 (ELECTRODE) ×3 IMPLANT
BLADE SURG 15 STRL LF DISP TIS (BLADE) ×4 IMPLANT
BLADE SURG 15 STRL SS (BLADE) ×6
CATH FOLEY 2WAY SLVR  5CC 14FR (CATHETERS) ×1
CATH FOLEY 2WAY SLVR  5CC 16FR (CATHETERS)
CATH FOLEY 2WAY SLVR 5CC 14FR (CATHETERS) ×2 IMPLANT
CATH FOLEY 2WAY SLVR 5CC 16FR (CATHETERS) ×2 IMPLANT
CLOTH BEACON ORANGE TIMEOUT ST (SAFETY) ×3 IMPLANT
COVER MAYO STAND STRL (DRAPES) IMPLANT
COVER SURGICAL LIGHT HANDLE (MISCELLANEOUS) ×3 IMPLANT
DECANTER SPIKE VIAL GLASS SM (MISCELLANEOUS) ×3 IMPLANT
DERMABOND ADVANCED (GAUZE/BANDAGES/DRESSINGS)
DERMABOND ADVANCED .7 DNX12 (GAUZE/BANDAGES/DRESSINGS) IMPLANT
DEVICE CAPIO SLIM SINGLE (INSTRUMENTS) IMPLANT
DEVICE CAPIO SUTURING (INSTRUMENTS)
DEVICE CAPIO SUTURING OPC (INSTRUMENTS) ×2 IMPLANT
DRAIN PENROSE 18X1/4 LTX STRL (WOUND CARE) ×3 IMPLANT
DRAPE LG THREE QUARTER DISP (DRAPES) ×3 IMPLANT
DRAPE UTILITY XL STRL (DRAPES) ×1 IMPLANT
ELECT REM PT RETURN 9FT ADLT (ELECTROSURGICAL) ×3
ELECTRODE REM PT RTRN 9FT ADLT (ELECTROSURGICAL) ×2 IMPLANT
GAUZE PACKING 2X5 YD STERILE (GAUZE/BANDAGES/DRESSINGS) ×2 IMPLANT
GAUZE SPONGE 4X4 16PLY XRAY LF (GAUZE/BANDAGES/DRESSINGS) ×7 IMPLANT
GLOVE BIO SURGEON STRL SZ 6.5 (GLOVE) ×3 IMPLANT
GLOVE BIO SURGEON STRL SZ7.5 (GLOVE) ×3 IMPLANT
GLOVE BIOGEL M STRL SZ7.5 (GLOVE) ×3 IMPLANT
GLOVE ECLIPSE 8.5 STRL (GLOVE) ×10 IMPLANT
GOWN STRL REIN XL XLG (GOWN DISPOSABLE) ×6 IMPLANT
HOLDER FOLEY CATH W/STRAP (MISCELLANEOUS) ×3 IMPLANT
IV NS 1000ML (IV SOLUTION)
IV NS 1000ML BAXH (IV SOLUTION) ×2 IMPLANT
KIT BASIN OR (CUSTOM PROCEDURE TRAY) ×3 IMPLANT
NDL MAYO 6 CRC TAPER PT (NEEDLE) ×2 IMPLANT
NEEDLE HYPO 22GX1.5 SAFETY (NEEDLE) ×1 IMPLANT
NEEDLE MAYO .5 CIRCLE (NEEDLE) IMPLANT
NEEDLE MAYO 6 CRC TAPER PT (NEEDLE) IMPLANT
NS IRRIG 1000ML POUR BTL (IV SOLUTION) ×2 IMPLANT
PACK CYSTO (CUSTOM PROCEDURE TRAY) ×3 IMPLANT
PENCIL BUTTON HOLSTER BLD 10FT (ELECTRODE) ×3 IMPLANT
PLUG CATH AND CAP STER (CATHETERS) ×3 IMPLANT
POSITIONER SURGICAL ARM (MISCELLANEOUS) ×3 IMPLANT
RETRACTOR STAY HOOK 5MM (MISCELLANEOUS) ×3 IMPLANT
SHEET LAVH (DRAPES) ×3 IMPLANT
SUT CAPIO ETHIBPND (SUTURE) ×4 IMPLANT
SUT SILK 2 0 SH (SUTURE) ×2 IMPLANT
SUT VIC AB 0 CT1 27 (SUTURE)
SUT VIC AB 0 CT1 27XBRD ANTBC (SUTURE) ×4 IMPLANT
SUT VIC AB 2-0 CT1 27 (SUTURE) ×3
SUT VIC AB 2-0 CT1 27XBRD (SUTURE) ×4 IMPLANT
SUT VIC AB 2-0 SH 27 (SUTURE) ×18
SUT VIC AB 2-0 SH 27X BRD (SUTURE) ×12 IMPLANT
SUT VIC AB 3-0 PS2 18 (SUTURE)
SUT VIC AB 3-0 PS2 18XBRD (SUTURE) IMPLANT
SUT VIC AB 3-0 SH 27 (SUTURE) ×15
SUT VIC AB 3-0 SH 27XBRD (SUTURE) ×12 IMPLANT
SUT VIC AB 4-0 PS2 27 (SUTURE) ×2 IMPLANT
SUT VICRYL 0 UR6 27IN ABS (SUTURE) ×16 IMPLANT
SYRINGE 10CC LL (SYRINGE) ×3 IMPLANT
TOWEL OR 17X26 10 PK STRL BLUE (TOWEL DISPOSABLE) ×3 IMPLANT
TOWEL OR NON WOVEN STRL DISP B (DISPOSABLE) ×3 IMPLANT
TUBING CONNECTING 10 (TUBING) ×6 IMPLANT
WATER STERILE IRR 1500ML POUR (IV SOLUTION) ×2 IMPLANT
WATER STERILE IRR 500ML POUR (IV SOLUTION) ×2 IMPLANT
YANKAUER SUCT BULB TIP 10FT TU (MISCELLANEOUS) ×3 IMPLANT

## 2013-01-18 NOTE — Anesthesia Postprocedure Evaluation (Signed)
Anesthesia Post Note  Patient: Mindy Ellis  Procedure(s) Performed: Procedure(s) (LRB): CYSTOSCOPY (N/A) ANTERIOR (CYSTOCELE)  (N/A) PROCTOSCOPY  Anesthesia type: General  Patient location: PACU  Post pain: Pain level controlled  Post assessment: Post-op Vital signs reviewed  Last Vitals: BP 112/57  Pulse 76  Temp(Src) 36.9 C (Oral)  Resp 14  Ht 5\' 4"  (1.626 m)  Wt 171 lb (77.565 kg)  BMI 29.34 kg/m2  SpO2 100%  Post vital signs: Reviewed  Level of consciousness: sedated  Complications: No apparent anesthesia complications

## 2013-01-18 NOTE — Anesthesia Preprocedure Evaluation (Addendum)
Anesthesia Evaluation  Patient identified by MRN, date of birth, ID band Patient awake    Reviewed: Allergy & Precautions, H&P , NPO status , Patient's Chart, lab work & pertinent test results  History of Anesthesia Complications (+) PONV and history of anesthetic complications  Airway       Dental  (+) Dental Advisory Given   Pulmonary neg pulmonary ROS,          Cardiovascular hypertension, Pt. on medications     Neuro/Psych  Neuromuscular disease negative psych ROS   GI/Hepatic Neg liver ROS, GERD-  Medicated,  Endo/Other  diabetes, Type 2  Renal/GU negative Renal ROS     Musculoskeletal  (+) Fibromyalgia -  Abdominal   Peds  Hematology negative hematology ROS (+)   Anesthesia Other Findings   Reproductive/Obstetrics                           Anesthesia Physical Anesthesia Plan  ASA: II  Anesthesia Plan: General   Post-op Pain Management:    Induction: Intravenous  Airway Management Planned: LMA  Additional Equipment:   Intra-op Plan:   Post-operative Plan: Extubation in OR  Informed Consent: I have reviewed the patients History and Physical, chart, labs and discussed the procedure including the risks, benefits and alternatives for the proposed anesthesia with the patient or authorized representative who has indicated his/her understanding and acceptance.   Dental advisory given  Plan Discussed with: CRNA  Anesthesia Plan Comments:         Anesthesia Quick Evaluation

## 2013-01-18 NOTE — Op Note (Signed)
Preoperative diagnosis: Cystocele and rectocele and mild vault prolapse Postoperative diagnosis: Cystocele and rectocele and mild vault prolapse Surgery: Cystocele repair and cystoscopy and examination under anesthesia by general surgery Surgeon: Dr. Lorin Picket Rhone Ozaki Assistant: Pecola Leisure  The patient has the above diagnoses and consented to the above procedure. Preoperative laboratory tests were normal. Preoperative antibiotics were given. Leg position was excellent to minimize the risk of compartment syndrome and neuropathy and deep vein thrombosis. Time out was performed  Under anesthesia her vaginal cuff dissended 3-4 cm and she had a grade 2 cystocele and high small grade 2 rectocele. She had shortening of the anterior vaginal wall and her bladder neck was very well supported almost hiding the cystocele near the urethra. I easily identified the vaginal cuff and dimples and placed a 3-0 Vicryl suture.  It was also noted that she had a narrow pubic arch and there was not a lot of distance between the midline of her cystocele and the lateral walls.  I instilled 25 cc of a lidocaine epinephrine mixture to develop my dissection plane  Between 3 Allis clamps I made a T-shaped anterior vaginal wall incision and carefully sharply dissected mobilizing the overlying vaginal wall mucosa from the underlying pubocervical fascia. The anterior vaginal wall was short and the cystocele was almost like a small size ball like a protruding thumb. I took a lot of time to sharply and bluntly dissect to the white line bilaterally. She still had a very short area to imbricate.  On further inspection she did have another vaginal wall dimple approximately 1.5 cm cephalad to my T-shaped incision.  Utilizing Allis clamps I sharply dissected the bladder from the apical anterior vaginal wall mucosa mobilizing well at the apex. This was done sharply and carefully and I was very pleased with the dissection. I mobilized  back to my second marking suture and did not see any enterocele or peritoneum or bowel.  I did an anterior repair of running 2-0 Vicryl not imbricating the bladder neck. Because of the nature of her anatomy it reduced the cystocele very well. I was careful to get as much anterior length as I could.  I cystoscoped the patient. Cystoscopically the anterior repair looked very good and cephalad enough. There was no distortion of the ureteral orifices. There was excellent blue jets bilaterally. There is no bladder injury.  Based on her anatomy and the findings I did not feel that I could safely do a sacrospinous vault suspension and her anterior vaginal cuff was actually well supported when I released the traction.  Earlier in the case she had fecal incontinence from a loose watery bowel movement. Extra care was taken to change equipment and to cover the area with a new drape. Extra care was taken with gloves and all instruments.  What I was almost finished and about to close the anterior vaginal wall she had fecal incontinence again coming around both sides of the retractor anteriorly and into the vagina.  The vagina was copiously irrigated with double antibiotic. I then used approximately 2-1/2 L of sterile water that was used for cystoscopy to irrigate the vagina. All equipment and drapes and gloves as above were once again changed.  The decision not to do a sacrospinous fixation was based upon the above findings but then supported by the fact I did not want to use a graft or open new dissection planes with the fecal incontinence.  Although the 3 of [place] in the room felt that the stool  came from below I wanted carefully to inspect the vaginal cuff to make certain that there was no rectal injury. This would be very odd based upon the dissection that I just performed staying very close  to the anterior vaginal wall and apical mucosa and sharply dissecting 1-2 mm at a time.  She did have a little bit of  bulging of tissue posterior apically that may represent mild thinning of the rectal wall. It also may have represented further cystocele.  It was not easy to get good exposure in spite of using multiple instruments and techniques to do a simultaneous rectal examination and expose the area in question but I was able to do so. I felt that there was no bowel injury.  To make certain I consulted general surgery and Dr. Darylene Price did an examination under anesthesia. He did not feel visually and palpably with a finger in the rectum that there was any bowel injury and we both felt there was plenty of tissue between his finger and the vagina.  He decided to perform a sigmoidoscopy while I was retracting in the vagina and once again I could palpate the sigmoidoscope and there was no obvious bowel injury. There was no rectal blood but the exam was somewhat limited by the amount of loose stool.  He insufflated air into the rectum and with water in the vagina there was no bubbles. There was never any stool noted at the apex. Multiple examinations of the area in question was over a time frame of at least 45 minutes. She continued to have rectal incontinence of loose stool but none at the apex in the vagina.  I was very comfortable that there was not a rectal injury. The area in question was oversewn with one 2-0 Vicryl suture not placing tissue on any tension.  More vaginal irrigation was performed. I trimmed a minimal amount of anterior vaginal wall mucosa and closed the anterior vaginal wall with running 2-0 Vicryl on a CT1 needle. Because of her anatomy there was actually excellent reduction of the cystocele with the anterior repair alone.   Posteriorly she had a high grade 2 and more so a mild posterior apical defect. Based on her anatomy I did not feel that she needed a posterior repair. The decision was also supported by the ongoing issues with fecal incontinence and we had to drape and change equipment 3 times. I  thought it was in the patient's best interest at this point not to continue and perform the rectocele repair.  She had very good vaginal length at the end of the case. There was very good support anteriorly and minimal defect posteriorly. One vaginal pack with Estrace cream was applied  Hopefully the surgery will meet the patient's treatment goals.   Flagyl was also given intraoperatively because of the fecal incontinence.

## 2013-01-18 NOTE — Interval H&P Note (Signed)
History and Physical Interval Note:  01/18/2013 7:06 AM  Mindy Ellis  has presented today for surgery, with the diagnosis of Cystocele,rectocele, vault prolapse  The various methods of treatment have been discussed with the patient and family. After consideration of risks, benefits and other options for treatment, the patient has consented to  Procedure(s): CYSTOSCOPY (N/A) ANTERIOR (CYSTOCELE) AND POSTERIOR REPAIR (RECTOCELE) (N/A) VAGINAL VAULT SUSPENSION WITH GRAFT (N/A) as a surgical intervention .  The patient's history has been reviewed, patient examined, no change in status, stable for surgery.  I have reviewed the patient's chart and labs.  Questions were answered to the patient's satisfaction.     Georgean Spainhower A

## 2013-01-18 NOTE — Transfer of Care (Signed)
Immediate Anesthesia Transfer of Care Note  Patient: Mindy Ellis  Procedure(s) Performed: Procedure(s): CYSTOSCOPY (N/A) ANTERIOR (CYSTOCELE)  (N/A) PROCTOSCOPY  Patient Location: PACU  Anesthesia Type:General  Level of Consciousness: awake, alert  and oriented  Airway & Oxygen Therapy: Patient Spontanous Breathing and Patient connected to face mask oxygen  Post-op Assessment: Report given to PACU RN and Post -op Vital signs reviewed and stable  Post vital signs: Reviewed and stable  Complications: No apparent anesthesia complications

## 2013-01-18 NOTE — Progress Notes (Signed)
Looks great; no pain; good output; vitals normal Described findings and OR Pt aware that I do no believe she has a rectal injury Pt aware that reasons why i did not do the rectocele repair: mild defect/fecal soilage could contaminate another wound; amount of soilage was continuous and her lower bowel was not empty of scope

## 2013-01-19 ENCOUNTER — Encounter (HOSPITAL_COMMUNITY): Payer: Self-pay | Admitting: Urology

## 2013-01-19 LAB — BASIC METABOLIC PANEL
BUN: 9 mg/dL (ref 6–23)
CO2: 29 mEq/L (ref 19–32)
Calcium: 8.8 mg/dL (ref 8.4–10.5)
Chloride: 99 mEq/L (ref 96–112)
Creatinine, Ser: 0.87 mg/dL (ref 0.50–1.10)
GFR calc Af Amer: 80 mL/min — ABNORMAL LOW (ref >90)
GFR calc non Af Amer: 69 mL/min — ABNORMAL LOW (ref >90)
Glucose, Bld: 155 mg/dL — ABNORMAL HIGH (ref 70–99)
Potassium: 3.1 mEq/L — ABNORMAL LOW (ref 3.5–5.1)
Sodium: 135 mEq/L (ref 135–145)

## 2013-01-19 LAB — HEMOGLOBIN AND HEMATOCRIT, BLOOD
HCT: 32.3 % — ABNORMAL LOW (ref 36.0–46.0)
Hemoglobin: 10.5 g/dL — ABNORMAL LOW (ref 12.0–15.0)

## 2013-01-19 MED ORDER — METRONIDAZOLE 500 MG PO TABS: 500.0000 mg | ORAL_TABLET | Freq: Three times a day (TID) | ORAL | Status: DC

## 2013-01-19 MED ORDER — HYDROCODONE-ACETAMINOPHEN 5-325 MG PO TABS: 1.0000 | ORAL_TABLET | Freq: Four times a day (QID) | ORAL | Status: DC | PRN

## 2013-01-19 MED ORDER — CIPROFLOXACIN HCL 250 MG PO TABS: 250.0000 mg | ORAL_TABLET | Freq: Two times a day (BID) | ORAL | Status: DC

## 2013-01-19 NOTE — Progress Notes (Signed)
Looks great Vitals normal Post op and surgery discussed No pain Ambulates well

## 2013-01-19 NOTE — Discharge Summary (Signed)
Date of admission: 01/18/2013  Date of discharge: 01/19/2013  Admission diagnosis: cystocele and rectocele  Discharge diagnosis: cystocele and rectocele  Secondary diagnoses: as above  History and Physical: For full details, please see admission history and physical. Briefly, Mindy Ellis is a 62 y.o. year old patient with with above diagnosis.   Hospital Course: Cystocele repair went well. Post op course uneventful.   Laboratory values:  Recent Labs  01/18/13 1550 01/19/13 0605  HGB 12.1 10.5*  HCT 36.6 32.3*    Recent Labs  01/19/13 1610  CREATININE 0.87    Disposition: Home  Discharge instruction: The patient was instructed to be ambulatory but told to refrain from heavy lifting, strenuous activity, or driving. Discussed in detail  Discharge medications:    Medication List    STOP taking these medications       estradiol 0.1 MG/GM vaginal cream  Commonly known as:  ESTRACE     nitrofurantoin (macrocrystal-monohydrate) 100 MG capsule  Commonly known as:  MACROBID      TAKE these medications       acetaminophen 500 MG tablet  Commonly known as:  TYLENOL  Take 500 mg by mouth every 6 (six) hours as needed for pain.     ALIGN 4 MG Caps  Take 4 mg by mouth daily.     ALPRAZolam 0.25 MG tablet  Commonly known as:  XANAX  Take 0.25 mg by mouth at bedtime as needed. For sleep     ciprofloxacin 250 MG tablet  Commonly known as:  CIPRO  Take 1 tablet (250 mg total) by mouth 2 (two) times daily.     DULoxetine 60 MG capsule  Commonly known as:  CYMBALTA  Take 60 mg by mouth every morning.     fish oil-omega-3 fatty acids 1000 MG capsule  Take 1 g by mouth daily.     hydrochlorothiazide 25 MG tablet  Commonly known as:  HYDRODIURIL  Take 25 mg by mouth every morning.     HYDROcodone-acetaminophen 5-325 MG per tablet  Commonly known as:  NORCO  Take 1-2 tablets by mouth every 6 (six) hours as needed for pain.     loratadine 10 MG tablet   Commonly known as:  CLARITIN  Take 10 mg by mouth every morning.     metroNIDAZOLE 500 MG tablet  Commonly known as:  FLAGYL  Take 1 tablet (500 mg total) by mouth 3 (three) times daily.     traZODone 100 MG tablet  Commonly known as:  DESYREL  Take 100 mg by mouth at bedtime.        Followup:      Follow-up Information   Follow up with Leonilda Cozby A, MD. (aas scheduled)    Specialty:  Urology   Contact information:   [address] [facility] [place] [address] [phone]

## 2013-01-19 NOTE — Op Note (Signed)
Mindy Ellis, Mindy Ellis               ACCOUNT NO.:  [id number]  MEDICAL RECORD NO.:  [id number]  LOCATION:  1417                         FACILITY:  [facility]  PHYSICIAN:  Lodema Pilot, MD       DATE OF BIRTH:  06-09-1950  DATE OF PROCEDURE:  01/18/2013 DATE OF DISCHARGE:                              OPERATIVE REPORT   PROCEDURE:  Redo proctoscopy.  PREOPERATIVE DIAGNOSIS:  Cystocele and rectocele.  POSTOPERATIVE DIAGNOSIS:  Cystocele and rectocele.  SURGEON:  Lodema Pilot, MD  ASSISTANT:  Martina Sinner, MD, with [facility].  FLUIDS:  Please see Anesthesia record.  ESTIMATED BLOOD LOSS:  None.  SPECIMENS:  None.  COMPLICATIONS:  None apparent.  FINDINGS:  Stool-filled rectal wall.  No evidence of full-thickness injury.  INDICATIONS FOR PROCEDURE:  I was called intraoperatively by Dr. Sherron Monday to evaluate for possible full-thickness rectal injury while performing cystocele and rectocele.  The patient was already asleep intraoperative and there was no way to obtain verbal consent from the patient.  OPERATIVE DETAILS:  The patient was already intraoperative with a cystocele and rectocele repair by Dr. Sherron Monday.  He was concerned that there was a possible rectal injury.  She had been leaking liquid stool from her rectum and from underneath the retractors.  They saw some stool is staining as well as some bulging of what looked like the peritoneum or the rectum, and he was concerned for possible rectal injury.  I scrubbed in to examine the area of concern and there was no obvious visible rectal injury.  There was an area of bulging tissue which was concerning for possible partial injury, and I performed digital exam through the rectum and also through the vagina with the retractors in place, which is difficult to do a bimanual examination of the area in concern and so, I performed a rigid proctoscopy and sigmoidoscopy.  I passed the rigid sigmoidoscope into the  rectum, passed the injury at about 13 cm and then digital examination through the vagina and this area appeared to have plenty of tissues between the scope and the area of concern.  I advanced the scope to the 15 cm from the anal verge, and this is way past the injured area of concern and then the scope was backed up, and I attempted to examine the mucosa internally, however, there was too much stool staining and liquid stool, which is thick enough that it was not able to be suctioned through the scope and the mucosa was unable to be completely visualized.  I backed it up then through digital guidance to the area of concern and examined internally and there was no evidence of mucosal injury.  So at this time, I insufflated the rectum and filled it with air while there was water in the vaginal vault, looking for any air leakage and none was identified. It was pumped up enough to the air began leaking from the anus around the scope.  The scope was then removed and the mucosal examination was suboptimal due to the large amount of stool in the vault.  Again, there appeared to be no rectal injury.  There was some excess tissue in the area  over what appeared to be the bulging tissue and Dr. Sherron Monday imbricated this excess tissue over the area of concern with 2-0 Vicryl sutures.  My scope was removed and the patient tolerated the procedure well without apparent complications.  Please refer to Dr. Mina Marble noter for the remainder of the operative note.          ______________________________ Lodema Pilot, MD     BL/MEDQ  D:  01/18/2013  T:  01/19/2013  Job:  [id number]

## 2013-01-19 NOTE — Progress Notes (Signed)
Pt voided 325 cc of clear yellow urine.  PVR checked via bladder scan and resulted in 15 cc. Mindy Ellis

## 2013-07-07 ENCOUNTER — Encounter: Payer: Self-pay | Admitting: Neurology

## 2013-07-07 ENCOUNTER — Ambulatory Visit (INDEPENDENT_AMBULATORY_CARE_PROVIDER_SITE_OTHER): Payer: BC Managed Care – PPO | Admitting: Neurology

## 2013-07-07 VITALS — BP 115/60 | HR 96 | Temp 97.7°F | Ht 64.0 in | Wt 177.6 lb

## 2013-07-07 DIAGNOSIS — G609 Hereditary and idiopathic neuropathy, unspecified: Secondary | ICD-10-CM | POA: Insufficient documentation

## 2013-07-07 DIAGNOSIS — I10 Essential (primary) hypertension: Secondary | ICD-10-CM | POA: Insufficient documentation

## 2013-07-07 DIAGNOSIS — E119 Type 2 diabetes mellitus without complications: Secondary | ICD-10-CM

## 2013-07-07 DIAGNOSIS — R413 Other amnesia: Secondary | ICD-10-CM | POA: Insufficient documentation

## 2013-07-07 MED ORDER — GABAPENTIN 300 MG PO CAPS: ORAL_CAPSULE | ORAL | Status: DC

## 2013-07-07 NOTE — Patient Instructions (Signed)
1. Start gabapentin 300mg : Take 1 capsule at bedtime for 1 week, then increase to 2 caps at bedtime for 1 week, then increase to 1 cap in AM, 2 caps in PM 2. Hold off on Trazodone while starting the gabapentin 3. We will obtain labwork done by Dr. Hilma Favors, and will call you if further labs are needed 4. Agree with psychiatry and potentially rheumatology consult 5. Follow-up in 3 months

## 2013-07-07 NOTE — Progress Notes (Signed)
NEUROLOGY CONSULTATION NOTE  Mindy Ellis MRN: [id number] DOB: 04-Dec-1950  Referring provider: Dr. Sharilyn Sites Primary care provider: Dr. Sharilyn Sites  Reason for consult:  Memory loss, myalgias  Dear Dr Hilma Favors:  Thank you for your kind referral of Mindy Ellis for consultation of the above symptoms. Although her history is well known to you, please allow me to reiterate it for the purpose of our medical record. Records and images were personally reviewed where available.  HISTORY OF PRESENT ILLNESS: This is a pleasant 63 year old right-handed woman with a history of hypertension, diabetes, fibromyalgia, presenting for evaluation of continued myalgias and memory loss.  She reports that she "feels my body all the time."  She has a dull achy pain in her shoulders, amrs, and legs, with occasional shooting pain.  She has burning pain in both feet.  Symptoms started around 4-5 years ago, but worsened over the past few months.  She has tried several medications, reporting that Cymbalta helped her best but threw her balance off.  She recalls trying Savella with no effect.  She is currently on Wellbutrin and is unsure if this is helping.  She reports that she knows she is depressed and has not contacted psychiatry yet.    She started becoming concerned about her memory recently, when she was at the hair salon and could not recall the name of her stylist.  Last night she could not recall what she wanted to order.  Over the past 6 months, she reports having a lot on her mind, taking care of her husband with PTSD, her sick mother, and fixing to retire, which is causing her anxiety.  She has been told by her children that she repeats things excessively.  She denies forgetting important details of conversations or events.  She denies misplacing things or getting turned around at home.  She forgot to pay a bill 1 month ago.  She takes her medications without prompting.  She has noticed some  word-finding difficulties.  She denies trouble following instructions. She continues to drive and does not get lost.  She performs ADLs independently.  She has a history of a left posterior frontal parasagittal meningioma.  This was diagnosed in 1999 and she continues to follow-up annually with neurosurgeon Dr. Vertell Limber.  I personally reviewed her last MRI done 07/2012 showing the left posterior frontal parasagittal meningioma based on the falx and lateral margin of the superior sagittal sinus measuring 17 x 17 x 26 mm. There is no significant change in size or shape compared with previous study. The mass has grown into the superior sagittal sinus (SSS), and the sinus is occluded at this level, as demonstrated previously. There is a small cortical vein which courses along the rightward aspect of the meningioma distal to which the SSS displays appropriate flow  related enhancement/patency. The small collateral vein was best demonstrated on prior MRV of 05/22/2009. The anterior 1/3 of the SSS also remains patent based on flow void and post contrast enhancement. No evidence for venous infarction. No transcalvarial extension. No adjacent vasogenic edema in the brain. Mild atrophy. No significant white matter disease.   She denies any headaches, dizziness, diplopia, dysarthria, dysphagia, falls, focal weakness.  She occasionally gets tingling in her fingers and legs.  Trazodone helps with her sleep. Her mother has a history of diabetes and memory problems.  Laboratory Data: Lab Results  Component Value Date   WBC 6.7 01/10/2013   HGB 10.5* 01/19/2013  HCT 32.3* 01/19/2013   MCV 86.4 01/10/2013   PLT 375 01/10/2013     Chemistry      Component Value Date/Time   NA 135 01/19/2013 0605   NA 139 06/20/2010   K 3.1* 01/19/2013 0605   CL 99 01/19/2013 0605   CO2 29 01/19/2013 0605   BUN 9 01/19/2013 0605   CREATININE 0.87 01/19/2013 0605   GLU 116 06/20/2010      Component Value Date/Time   CALCIUM 8.8  01/19/2013 0605   AST 11* 06/20/2010   ALT 10 06/20/2010      PAST MEDICAL HISTORY: Past Medical History  Diagnosis Date  . GERD (gastroesophageal reflux disease)   . Acid reflux   . Dysphagia   . Chronic diarrhea   . Abdominal pain   . DDD (degenerative disc disease)   . Hypertension   . Diabetes mellitus without complication     diet controlled  . Fibromyalgia   . PONV (postoperative nausea and vomiting)     nausea, pt must not vomit due to reflux surgery  . Meningioma     PAST SURGICAL HISTORY: Past Surgical History  Procedure Laterality Date  . Colonoscopy  11/11/07    NUR  . Upper gastrointestinal endoscopy  2006  . Surgery  for acid reflux  1999  . Eye surgery    . Tonsillectomy    . Breast surgery Left 21 yrs ago    benign lump removed  . Cholecystectomy    . Abdominal hysterectomy  age 3  . Appendectomy  age 49    both ovaries removed  . Lasix eye surgery Bilateral   . Cystoscopy N/A 01/18/2013    Procedure: CYSTOSCOPY;  Surgeon: Reece Packer, MD;  Location: [facility];  Service: Urology;  Laterality: N/A;  . Anterior and posterior repair N/A 01/18/2013    Procedure: ANTERIOR (CYSTOCELE) ;  Surgeon: Reece Packer, MD;  Location: [facility];  Service: Urology;  Laterality: N/A;  . Proctoscopy  01/18/2013    Procedure: PROCTOSCOPY;  Surgeon: Reece Packer, MD;  Location: [facility];  Service: Urology;;    MEDICATIONS: Current Outpatient Prescriptions on File Prior to Visit  Medication Sig Dispense Refill  . acetaminophen (TYLENOL) 500 MG tablet Take 500 mg by mouth every 6 (six) hours as needed for pain.      Marland Kitchen ALPRAZolam (XANAX) 0.25 MG tablet Take 0.25 mg by mouth at bedtime as needed. For sleep      . fish oil-omega-3 fatty acids 1000 MG capsule Take 1 g by mouth daily.       . hydrochlorothiazide (HYDRODIURIL) 25 MG tablet Take 25 mg by mouth every morning.      Marland Kitchen HYDROcodone-acetaminophen (NORCO) 5-325 MG per tablet Take 1-2 tablets by mouth every 6  (six) hours as needed for pain.  30 tablet  0  . loratadine (CLARITIN) 10 MG tablet Take 10 mg by mouth every morning.       . Probiotic Product (ALIGN) 4 MG CAPS Take 4 mg by mouth daily.      . traZODone (DESYREL) 100 MG tablet Take 100 mg by mouth at bedtime.        . [DISCONTINUED] fexofenadine (ALLEGRA) 180 MG tablet Take by mouth daily.        . [DISCONTINUED] mirtazapine (REMERON) 15 MG tablet Take 15 mg by mouth at bedtime.        . [DISCONTINUED] omeprazole (PRILOSEC) 20 MG capsule Take 20 mg by mouth daily.        . [  DISCONTINUED] promethazine (PHENERGAN) 25 MG tablet Take 25 mg by mouth every 6 (six) hours as needed.         No current facility-administered medications on file prior to visit.    ALLERGIES: Allergies  Allergen Reactions  . Mirtazapine Nausea Only  . Naproxen Sodium     Upsets stomach  . Other     Torcan=caused signs of stroke,  vioxx=headache  . Statins Other (See Comments)    Muscle aches  . Zolpidem Tartrate Other (See Comments)    Ambien= confusion  . Erythromycin Rash  . Penicillins Rash  . Sulfur Rash  . Tetracyclines & Related Rash    FAMILY HISTORY: History reviewed. No pertinent family history.  SOCIAL HISTORY: History   Social History  . Marital Status: Married    Spouse Name: N/A    Number of Children: N/A  . Years of Education: N/A   Occupational History  . Not on file.   Social History Main Topics  . Smoking status: Never Smoker   . Smokeless tobacco: Never Used  . Alcohol Use: Yes     Comment: occ wine  . Drug Use: No  . Sexual Activity: Yes    Birth Control/ Protection: Surgical   Other Topics Concern  . Not on file   Social History Narrative  . No narrative on file    REVIEW OF SYSTEMS: Constitutional: No fevers, chills, or sweats, no generalized fatigue, change in appetite Eyes: No visual changes, double vision, eye pain Ear, nose and throat: No hearing loss, ear pain, nasal congestion, sore  throat Cardiovascular: No chest pain, palpitations Respiratory:  No shortness of breath at rest or with exertion, wheezes GastrointestinaI: No nausea, vomiting, diarrhea, abdominal pain, fecal incontinence Genitourinary:  No dysuria, urinary retention or frequency Musculoskeletal:  No neck pain, back pain Integumentary: No rash, pruritus, skin lesions Neurological: as above Psychiatric: + depression, insomnia, anxiety Endocrine: No palpitations, fatigue, diaphoresis, mood swings, change in appetite, change in weight, increased thirst Hematologic/Lymphatic:  No anemia, purpura, petechiae. Allergic/Immunologic: no itchy/runny eyes, nasal congestion, recent allergic reactions, rashes  PHYSICAL EXAM: Filed Vitals:   07/07/13 0833  BP: 115/60  Pulse: 96  Temp: 97.7 F (36.5 C)   General: No acute distress Head:  Normocephalic/atraumatic Neck: supple, no paraspinal tenderness, full range of motion Back: No paraspinal tenderness Heart: regular rate and rhythm Lungs: Clear to auscultation bilaterally. Vascular: No carotid bruits. Skin/Extremities: No rash, no edema Neurological Exam: Mental status: alert and oriented to person, place, and time, no dysarthria or aphasia, Fund of knowledge is appropriate.  Recent and remote memory are intact.  Attention and concentration are normal.    Able to name objects and repeat phrases. MOCA 29/30 (missed point for hands of the clock) Cranial nerves: CN I: not tested CN II: pupils equal, round and reactive to light, visual fields intact, fundi unremarkable. CN III, IV, VI:  full range of motion, no nystagmus, no ptosis CN V: decreased pin on left V1, split midline with tuning fork, reporting it more on right side CN VII: upper and lower face symmetric CN VIII: hearing intact CN IX, X: gag intact, uvula midline CN XI: sternocleidomastoid and trapezius muscles intact CN XII: tongue midline Bulk & Tone: normal, no fasciculations. Motor: 5/5  throughout with no pronator drift. Sensation: decreased pin and cold on left UE and LE.  Decreased in a stocking distribution to pin on the right.  Decreased vibration to ankle bilaterally.  Romberg test slight sway Deep  Tendon Reflexes: +2 on both UE, bilateral patella. +2 on right ankle, absent left ankle jerk, no ankle clonus Plantar responses: downgoing bilaterally Cerebellar: no incoordination on finger to nose Gait: narrow-based and steady, slight difficulty with tandem walk but able Tremor: none  IMPRESSION: This is a 63 year old right-handed woman with a history of hypertension, diabetes, fibromyalgia, presenting with memory loss and myalgias.  No evidence of weakness on exam to suggest myopathy.  Symptoms are suggestive of a chronic pain syndrome such as fibromyalgia.  Exam shows evidence of peripheral neuropathy, labwork done with Dr. Hilma Favors will be requested for review.  We discussed memory issues, her MOCA score today is normal at 29/30.  No indication for starting cholinesterase inhibitor at this time. We discussed contribution of depression to memory changes, and she will be looking into seeing the psychiatrist.  She is scheduled to follow-up with Dr. Vertell Limber soon for interval MRI follow-up of the meningioma.  We discussed starting gabapentin for neuropathic pain, this may help with the fibromyalgia as well.  She will start at low dose and uptitrate as tolerated, side effects were discussed.  She will follow-up in 3 months.  Thank you for allowing me to participate in the care of this patient. Please do not hesitate to call for any questions or concerns.   Ellouise Newer, M.D.

## 2013-07-19 ENCOUNTER — Ambulatory Visit (INDEPENDENT_AMBULATORY_CARE_PROVIDER_SITE_OTHER): Payer: BC Managed Care – PPO | Admitting: Psychiatry

## 2013-07-19 ENCOUNTER — Encounter (HOSPITAL_COMMUNITY): Payer: Self-pay | Admitting: Psychiatry

## 2013-07-19 VITALS — BP 130/80 | Ht 64.0 in | Wt 177.0 lb

## 2013-07-19 DIAGNOSIS — F329 Major depressive disorder, single episode, unspecified: Secondary | ICD-10-CM

## 2013-07-19 DIAGNOSIS — F411 Generalized anxiety disorder: Secondary | ICD-10-CM

## 2013-07-19 DIAGNOSIS — F3289 Other specified depressive episodes: Secondary | ICD-10-CM

## 2013-07-19 MED ORDER — BUPROPION HCL ER (XL) 300 MG PO TB24: 300.0000 mg | ORAL_TABLET | ORAL | Status: DC

## 2013-07-19 NOTE — Progress Notes (Signed)
Psychiatric Assessment Adult  Patient Identification:  Mindy Ellis Date of Evaluation:  07/19/2013 Chief Complaint: "I worry about everyone in my family." History of Chief Complaint:   Chief Complaint  Patient presents with  . Anxiety  . Depression  . Establish Care    Anxiety Symptoms include nervous/anxious behavior.     this patient is a 63 year old married white female lives with her husband in [place]. 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  The patient was referred by Dr. Hilma Favors for further evaluation of anxiety and depression.  The patient states that she's is sort of person who always worries about everyone else. She had a difficult childhood. She never knew her biological father and her mother remarried when she was very young. Her stepfather was often out drinking or cheating on her mother and her mother worked a lot. The patient was responsible for a younger sister who is 5 years younger. She's not been the victim of abuse but did feel like she was neglected to her about her childhood.  The patient's husband has PTSD from the [place] War. At times he gets angry and agitated. She's taking care of an 50 year old mother who has some episodes of confusion and still lives by herself. She also worries about both of her grown sons have had financial issues. The patient herself will be retiring in June and she wonders how she is going to be spending her time. She was recently diagnosed with neuropathy and was placed on Neurontin which is helped quite a bit with the leg pain. For a while she stopped exercising and now feels like she's ready to go back to it.  She's not significantly depressed but more anxious and worried. She also has fibromyalgia with chronic body aches and low energy. She feels like the Wellbutrin she is on has helped to some degree but she still lacks get up and go. She's never been  suicidal and doesn't have any past psychiatric history. She does not use drugs or alcohol. Review of Systems  Constitutional: Positive for activity change.  HENT: Negative.   Eyes: Negative.   Respiratory: Negative.   Cardiovascular: Negative.   Gastrointestinal: Negative.   Endocrine: Negative.   Genitourinary: Negative.   Musculoskeletal: Positive for arthralgias and myalgias.  Skin: Negative.   Allergic/Immunologic: Negative.   Neurological: Negative.   Psychiatric/Behavioral: Positive for dysphoric mood. The patient is nervous/anxious.    Physical Exam not done  Depressive Symptoms: depressed mood, anhedonia, psychomotor retardation, fatigue, anxiety,  (Hypo) Manic Symptoms:   Elevated Mood:  No Irritable Mood:  No Grandiosity:  No Distractibility:  Yes Labiality of Mood:  No Delusions:  No Hallucinations:  No Impulsivity:  No Sexually Inappropriate Behavior:  No Financial Extravagance:  No Flight of Ideas:  No  Anxiety Symptoms: Excessive Worry:  Yes Panic Symptoms:  No Agoraphobia:  No Obsessive Compulsive: No  Symptoms: None, Specific Phobias:  No Social Anxiety:  No  Psychotic Symptoms:  Hallucinations: No None Delusions:  No Paranoia:  No   Ideas of Reference:  No  PTSD Symptoms: Ever had a traumatic exposure:  No Had a traumatic exposure in the last month:  No Re-experiencing: No None Hypervigilance:  No Hyperarousal: No None Avoidance: No None  Traumatic Brain Injury: No   Past Psychiatric History: Diagnosis: Depression, anxiety   Hospitalizations: none  Outpatient Care: Only through primary care   Substance Abuse Care: none  Self-Mutilation: no  Suicidal Attempts: none  Violent Behaviors: none   Past Medical History:   Past Medical History  Diagnosis Date  . GERD (gastroesophageal reflux disease)   . Acid reflux   . Dysphagia   . Chronic diarrhea   . Abdominal pain   . DDD (degenerative disc disease)   . Hypertension   .  Diabetes mellitus without complication     diet controlled  . Fibromyalgia   . PONV (postoperative nausea and vomiting)     nausea, pt must not vomit due to reflux surgery  . Meningioma    History of Loss of Consciousness:  No Seizure History:  No Cardiac History:  No Allergies:   Allergies  Allergen Reactions  . Mirtazapine Nausea Only  . Naproxen Sodium     Upsets stomach  . Other     Torcan=caused signs of stroke,  vioxx=headache  . Statins Other (See Comments)    Muscle aches  . Zolpidem Tartrate Other (See Comments)    Ambien= confusion  . Erythromycin Rash  . Penicillins Rash  . Sulfur Rash  . Tetracyclines & Related Rash   Current Medications:  Current Outpatient Prescriptions  Medication Sig Dispense Refill  . acetaminophen (TYLENOL) 500 MG tablet Take 500 mg by mouth every 6 (six) hours as needed for pain.      Marland Kitchen ALPRAZolam (XANAX) 0.25 MG tablet Take 0.25 mg by mouth at bedtime as needed. For sleep      . buPROPion (WELLBUTRIN XL) 300 MG 24 hr tablet Take 1 tablet (300 mg total) by mouth every morning.  30 tablet  2  . fish oil-omega-3 fatty acids 1000 MG capsule Take 1 g by mouth daily.       Marland Kitchen gabapentin (NEURONTIN) 300 MG capsule 1 cap at bedtime for 1 week, then increase to 2 caps at bedtime for 1 week, then increase to 1 cap in AM, 2 caps in PM  90 capsule  11  . hydrochlorothiazide (HYDRODIURIL) 25 MG tablet Take 25 mg by mouth every morning.      Marland Kitchen HYDROcodone-acetaminophen (NORCO) 5-325 MG per tablet Take 1-2 tablets by mouth every 6 (six) hours as needed for pain.  30 tablet  0  . loratadine (CLARITIN) 10 MG tablet Take 10 mg by mouth every morning.       . metFORMIN (GLUCOPHAGE) 500 MG tablet Take by mouth 2 (two) times daily with a meal.      . Probiotic Product (ALIGN) 4 MG CAPS Take 4 mg by mouth daily.      . traZODone (DESYREL) 100 MG tablet Take 100 mg by mouth at bedtime.        . [DISCONTINUED] fexofenadine (ALLEGRA) 180 MG tablet Take by mouth  daily.        . [DISCONTINUED] mirtazapine (REMERON) 15 MG tablet Take 15 mg by mouth at bedtime.        . [DISCONTINUED] omeprazole (PRILOSEC) 20 MG capsule Take 20 mg by mouth daily.        . [DISCONTINUED] promethazine (PHENERGAN) 25 MG tablet Take 25 mg by mouth every 6 (six) hours as needed.         No current facility-administered medications for this visit.    Previous Psychotropic Medications:  Medication Dose                          Substance Abuse History in the last 12 months: Substance Age of 1st Use Last Use  Amount Specific Type  Nicotine      Alcohol      Cannabis      Opiates      Cocaine      Methamphetamines      LSD      Ecstasy      Benzodiazepines      Caffeine      Inhalants      Others:                          Medical Consequences of Substance Abuse: n/a  Legal Consequences of Substance Abuse: n/a  Family Consequences of Substance Abuse: n/a  Blackouts:  No DT's:  No Withdrawal Symptoms:  No None  Social History: Current Place of Residence: [place] of Birth: [place] Family Members: Husband, 2 children, 3 grandchildren, mother Marital Status:  Married Children:   Sons: 2  Daughters:  Relationships: Education:  Dentist Problems/Performance: Religious Beliefs/Practices: Christian History of Abuse: none Ship broker History:  None. Legal History: none Hobbies/Interests: Dancing, traveling  Family History:   Family History  Problem Relation Age of Onset  . Depression Mother   . Anxiety disorder Maternal Grandmother   . Depression Maternal Grandmother   . Drug abuse Other     Mental Status Examination/Evaluation: Objective:  Appearance: Neat and Well Groomed  Eye Contact::  Good  Speech:  Clear and Coherent  Volume:  Normal  Mood:  Anxious mildly dysphoric   Affect:  Depressed  Thought Process:  Circumstantial  Orientation:  Full (Time,  Place, and Person)  Thought Content:  Rumination  Suicidal Thoughts:  No  Homicidal Thoughts:  No  Judgement:  Good  Insight:  Good  Psychomotor Activity:  Normal  Akathisia:  No  Handed:  Right  AIMS (if indicated):    Assets:  Communication Skills Desire for Improvement Resilience    Laboratory/X-Ray Psychological Evaluation(s)   Chart reviewed      Assessment:  Axis I: Depressive Disorder NOS and Generalized Anxiety Disorder  AXIS I Depressive Disorder NOS and Generalized Anxiety Disorder  AXIS II Deferred  AXIS III Past Medical History  Diagnosis Date  . GERD (gastroesophageal reflux disease)   . Acid reflux   . Dysphagia   . Chronic diarrhea   . Abdominal pain   . DDD (degenerative disc disease)   . Hypertension   . Diabetes mellitus without complication     diet controlled  . Fibromyalgia   . PONV (postoperative nausea and vomiting)     nausea, pt must not vomit due to reflux surgery  . Meningioma      AXIS IV other psychosocial or environmental problems and problems with primary support group  AXIS V 61-70 mild symptoms   Treatment Plan/Recommendations:  Plan of Care: Medication management   Laboratory  Psychotherapy: She is going to start seeing Maurice Small here   Medications: Given her poor energy and lack of motivation she will increase Wellbutrin XL from 150 mg to 300 mg every morning. She'll continue her other medications   Routine PRN Medications:  No  Consultations:   Safety Concerns:    Other:  She'll return in [place], [place], MD 4/14/201510:49 AM

## 2013-07-26 ENCOUNTER — Other Ambulatory Visit (HOSPITAL_COMMUNITY): Payer: Self-pay | Admitting: Family Medicine

## 2013-07-26 DIAGNOSIS — Z1231 Encounter for screening mammogram for malignant neoplasm of breast: Secondary | ICD-10-CM

## 2013-08-02 ENCOUNTER — Ambulatory Visit (HOSPITAL_COMMUNITY): Payer: Self-pay | Admitting: Psychiatry

## 2013-08-03 ENCOUNTER — Ambulatory Visit (INDEPENDENT_AMBULATORY_CARE_PROVIDER_SITE_OTHER): Payer: BC Managed Care – PPO | Admitting: Psychiatry

## 2013-08-03 DIAGNOSIS — F411 Generalized anxiety disorder: Secondary | ICD-10-CM
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
## 2013-08-03 NOTE — Patient Instructions (Signed)
Discussed orally 

## 2013-08-10 ENCOUNTER — Telehealth: Payer: Self-pay | Admitting: Neurology

## 2013-08-10 DIAGNOSIS — D320 Benign neoplasm of cerebral meninges: Secondary | ICD-10-CM | POA: Insufficient documentation

## 2013-08-10 NOTE — Telephone Encounter (Signed)
Patient given instructions per Dr. Delice Lesch.

## 2013-08-10 NOTE — Telephone Encounter (Signed)
Please advise 

## 2013-08-10 NOTE — Telephone Encounter (Signed)
Noted, she should wean this down, start by reducing by 1 capsule every week. thanks

## 2013-08-10 NOTE — Telephone Encounter (Signed)
Pt wants to consider coming off Gabapentin. Says she believes it has caused water retention/swelling. Does she need to taper or do anything special. Please call - [phone] / Gayleen Orem.

## 2013-08-16 ENCOUNTER — Ambulatory Visit (INDEPENDENT_AMBULATORY_CARE_PROVIDER_SITE_OTHER): Payer: BC Managed Care – PPO | Admitting: Psychiatry

## 2013-08-16 ENCOUNTER — Encounter (HOSPITAL_COMMUNITY): Payer: Self-pay | Admitting: Psychiatry

## 2013-08-16 VITALS — BP 100/60 | Ht 64.0 in | Wt 180.0 lb

## 2013-08-16 DIAGNOSIS — F411 Generalized anxiety disorder: Secondary | ICD-10-CM

## 2013-08-16 DIAGNOSIS — F329 Major depressive disorder, single episode, unspecified: Secondary | ICD-10-CM

## 2013-08-16 DIAGNOSIS — F3289 Other specified depressive episodes: Secondary | ICD-10-CM

## 2013-08-16 NOTE — Progress Notes (Signed)
Patient ID: Mindy Ellis, female   DOB: 1951/01/12, 63 y.o.   MRN: [id number]  Psychiatric Assessment Adult  Patient Identification:  BREONIA MADRIAGA Date of Evaluation:  08/16/2013 Chief Complaint: "I worry about everyone in my family." History of Chief Complaint:   Chief Complaint  Patient presents with  . Anxiety  . Depression  . Follow-up    Anxiety Symptoms include nervous/anxious behavior.     this patient is a 63 year old married white female lives with her husband in [place]. 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  The patient was referred by Dr. Hilma Favors for further evaluation of anxiety and depression.  The patient states that she's is sort of person who always worries about everyone else. She had a difficult childhood. She never knew her biological father and her mother remarried when she was very young. Her stepfather was often out drinking or cheating on her mother and her mother worked a lot. The patient was responsible for a younger sister who is 5 years younger. She's not been the victim of abuse but did feel like she was neglected to her about her childhood.  The patient's husband has PTSD from the [place] War. At times he gets angry and agitated. She's taking care of an 42 year old mother who has some episodes of confusion and still lives by herself. She also worries about both of her grown sons have had financial issues. The patient herself will be retiring in June and she wonders how she is going to be spending her time. She was recently diagnosed with neuropathy and was placed on Neurontin which is helped quite a bit with the leg pain. For a while she stopped exercising and now feels like she's ready to go back to it.  She's not significantly depressed but more anxious and worried. She also has fibromyalgia with chronic body aches and low energy. She feels like the Wellbutrin she is on has  helped to some degree but she still lacks get up and go. She's never been suicidal and doesn't have any past psychiatric history. She does not use drugs or alcohol.  The patient returns after four-week's. Her primary Dr. put her on Neurontin and is causing fluid retention and urinary retention and drowsiness so she is gradually getting off it. Because of the side effects of Neurontin and it's hard for her to tell if the increased Wellbutrin is helping her energy. She would like more time on it to see. She is still working and will be retiring next month and is looking forward to vacations with her family. Her mood is been good and she hasn't been significantly anxious. She denies suicidal ideation Review of Systems  Constitutional: Positive for activity change.  HENT: Negative.   Eyes: Negative.   Respiratory: Negative.   Cardiovascular: Negative.   Gastrointestinal: Negative.   Endocrine: Negative.   Genitourinary: Negative.   Musculoskeletal: Positive for arthralgias and myalgias.  Skin: Negative.   Allergic/Immunologic: Negative.   Neurological: Negative.   Psychiatric/Behavioral: Positive for dysphoric mood. The patient is nervous/anxious.    Physical Exam not done  Depressive Symptoms: depressed mood, anhedonia, psychomotor retardation, fatigue, anxiety,  (Hypo) Manic Symptoms:   Elevated Mood:  No Irritable Mood:  No Grandiosity:  No Distractibility:  Yes Labiality of Mood:  No Delusions:  No Hallucinations:  No Impulsivity:  No Sexually Inappropriate Behavior:  No Financial Extravagance:  No Flight of Ideas:  No  Anxiety  Symptoms: Excessive Worry:  Yes Panic Symptoms:  No Agoraphobia:  No Obsessive Compulsive: No  Symptoms: None, Specific Phobias:  No Social Anxiety:  No  Psychotic Symptoms:  Hallucinations: No None Delusions:  No Paranoia:  No   Ideas of Reference:  No  PTSD Symptoms: Ever had a traumatic exposure:  No Had a traumatic exposure in the  last month:  No Re-experiencing: No None Hypervigilance:  No Hyperarousal: No None Avoidance: No None  Traumatic Brain Injury: No   Past Psychiatric History: Diagnosis: Depression, anxiety   Hospitalizations: none  Outpatient Care: Only through primary care   Substance Abuse Care: none  Self-Mutilation: no  Suicidal Attempts: none  Violent Behaviors: none   Past Medical History:   Past Medical History  Diagnosis Date  . GERD (gastroesophageal reflux disease)   . Acid reflux   . Dysphagia   . Chronic diarrhea   . Abdominal pain   . DDD (degenerative disc disease)   . Hypertension   . Diabetes mellitus without complication     diet controlled  . Fibromyalgia   . PONV (postoperative nausea and vomiting)     nausea, pt must not vomit due to reflux surgery  . Meningioma    History of Loss of Consciousness:  No Seizure History:  No Cardiac History:  No Allergies:   Allergies  Allergen Reactions  . Mirtazapine Nausea Only  . Naproxen Sodium     Upsets stomach  . Other     Torcan=caused signs of stroke,  vioxx=headache  . Statins Other (See Comments)    Muscle aches  . Zolpidem Tartrate Other (See Comments)    Ambien= confusion  . Erythromycin Rash  . Penicillins Rash  . Sulfur Rash  . Tetracyclines & Related Rash   Current Medications:  Current Outpatient Prescriptions  Medication Sig Dispense Refill  . acetaminophen (TYLENOL) 500 MG tablet Take 500 mg by mouth every 6 (six) hours as needed for pain.      Marland Kitchen ALPRAZolam (XANAX) 0.25 MG tablet Take 0.25 mg by mouth at bedtime as needed. For sleep      . buPROPion (WELLBUTRIN XL) 300 MG 24 hr tablet Take 1 tablet (300 mg total) by mouth every morning.  30 tablet  2  . fish oil-omega-3 fatty acids 1000 MG capsule Take 1 g by mouth daily.       Marland Kitchen gabapentin (NEURONTIN) 300 MG capsule 1 cap at bedtime for 1 week, then increase to 2 caps at bedtime for 1 week, then increase to 1 cap in AM, 2 caps in PM  90 capsule  11   . hydrochlorothiazide (HYDRODIURIL) 25 MG tablet Take 25 mg by mouth every morning.      Marland Kitchen HYDROcodone-acetaminophen (NORCO) 5-325 MG per tablet Take 1-2 tablets by mouth every 6 (six) hours as needed for pain.  30 tablet  0  . loratadine (CLARITIN) 10 MG tablet Take 10 mg by mouth every morning.       . metFORMIN (GLUCOPHAGE) 500 MG tablet Take by mouth 2 (two) times daily with a meal.      . Probiotic Product (ALIGN) 4 MG CAPS Take 4 mg by mouth daily.      . traZODone (DESYREL) 100 MG tablet Take 100 mg by mouth at bedtime.        . [DISCONTINUED] fexofenadine (ALLEGRA) 180 MG tablet Take by mouth daily.        . [DISCONTINUED] mirtazapine (REMERON) 15 MG tablet Take 15 mg by  mouth at bedtime.        . [DISCONTINUED] omeprazole (PRILOSEC) 20 MG capsule Take 20 mg by mouth daily.        . [DISCONTINUED] promethazine (PHENERGAN) 25 MG tablet Take 25 mg by mouth every 6 (six) hours as needed.         No current facility-administered medications for this visit.    Previous Psychotropic Medications:  Medication Dose                          Substance Abuse History in the last 12 months: Substance Age of 1st Use Last Use Amount Specific Type  Nicotine      Alcohol      Cannabis      Opiates      Cocaine      Methamphetamines      LSD      Ecstasy      Benzodiazepines      Caffeine      Inhalants      Others:                          Medical Consequences of Substance Abuse: n/a  Legal Consequences of Substance Abuse: n/a  Family Consequences of Substance Abuse: n/a  Blackouts:  No DT's:  No Withdrawal Symptoms:  No None  Social History: Current Place of Residence: [place] of Birth: [place] Family Members: Husband, 2 children, 3 grandchildren, mother Marital Status:  Married Children:   Sons: 2  Daughters:  Relationships: Education:  Dentist Problems/Performance: Religious Beliefs/Practices:  Christian History of Abuse: none Ship broker History:  None. Legal History: none Hobbies/Interests: Dancing, traveling  Family History:   Family History  Problem Relation Age of Onset  . Depression Mother   . Anxiety disorder Maternal Grandmother   . Depression Maternal Grandmother   . Drug abuse Other     Mental Status Examination/Evaluation: Objective:  Appearance: Neat and Well Groomed  Eye Contact::  Good  Speech:  Clear and Coherent  Volume:  Normal  Mood:  Good today   Affect:  Bright   Thought Process:  Circumstantial  Orientation:  Full (Time, Place, and Person)  Thought Content:  Rumination  Suicidal Thoughts:  No  Homicidal Thoughts:  No  Judgement:  Good  Insight:  Good  Psychomotor Activity:  Normal  Akathisia:  No  Handed:  Right  AIMS (if indicated):    Assets:  Communication Skills Desire for Improvement Resilience    Laboratory/X-Ray Psychological Evaluation(s)   Chart reviewed      Assessment:  Axis I: Depressive Disorder NOS and Generalized Anxiety Disorder  AXIS I Depressive Disorder NOS and Generalized Anxiety Disorder  AXIS II Deferred  AXIS III Past Medical History  Diagnosis Date  . GERD (gastroesophageal reflux disease)   . Acid reflux   . Dysphagia   . Chronic diarrhea   . Abdominal pain   . DDD (degenerative disc disease)   . Hypertension   . Diabetes mellitus without complication     diet controlled  . Fibromyalgia   . PONV (postoperative nausea and vomiting)     nausea, pt must not vomit due to reflux surgery  . Meningioma      AXIS IV other psychosocial or environmental problems and problems with primary support group  AXIS V 61-70 mild symptoms   Treatment Plan/Recommendations:  Plan of Care: Medication  management   Laboratory  Psychotherapy: She is going to start seeing Maurice Small here   Medications: She'll continue Wellbutrin XL 300 mg every morning and continue her other medications other  than Neurontin to   Routine PRN Medications:  No  Consultations:   Safety Concerns:    Other:  She'll return in 2 months     Levonne Spiller, MD 5/12/20158:54 AM

## 2013-08-22 ENCOUNTER — Ambulatory Visit (HOSPITAL_COMMUNITY)
Admission: RE | Admit: 2013-08-22 | Discharge: 2013-08-22 | Disposition: A | Discharge status: Home / Self Care | Payer: BC Managed Care – PPO | Source: Ambulatory Visit | Attending: Family Medicine | Admitting: Family Medicine

## 2013-08-22 DIAGNOSIS — Z1231 Encounter for screening mammogram for malignant neoplasm of breast: Secondary | ICD-10-CM | POA: Insufficient documentation

## 2013-09-01 ENCOUNTER — Ambulatory Visit (INDEPENDENT_AMBULATORY_CARE_PROVIDER_SITE_OTHER): Payer: BC Managed Care – PPO | Admitting: Psychiatry

## 2013-09-01 DIAGNOSIS — F411 Generalized anxiety disorder: Secondary | ICD-10-CM

## 2013-09-05 NOTE — Patient Instructions (Signed)
Discussed orally 

## 2013-09-05 NOTE — Progress Notes (Signed)
   THERAPIST PROGRESS NOTE  Session Time: Thursday 09/01/2013 4:05 PM - 4:55 PM  Participation Level: Active  Behavioral Response: Well GroomedAlertAnxious  Type of Therapy: Individual Therapy  Treatment Goals addressed: Establishing therapeutic alliance, improve ability to manage stress and anxiety  Interventions: Supportive  Summary: Mindy Ellis is a 63 y.o. female who is referred for services by psychiatrist Dr. Harrington Challenger due to patient experiencing symptoms of anxiety and depression. Per patient's report symptoms have been present for several years. She experiences low energy, fatigue, and lack of interest in activities. She reports several stressors including her relationship with her husband who is a [place] veteran and suffers from PTSD. She also worries about her 82 year old mother who resides alone and does not always wear her emergency medical alert device. She has fallen in the past and patient fears something may happen to mother. Patient worries about her two adult sons and blames herself for sons not being as self-sufficient as she expects. She reports a pattern of feeling like everything is her fault Patient ruminates over the past regarding sister and niece using her mother's retirement account. Patient states becoming anxious and overwhelmed even over small tasks such as laundry. She has frequent crying spells but tends to internalize her feelings.   Patient reports feeling better since last session although she continues to have days when she just wants to cry She reports decreased anxiety since taking medication as prescribed by psychiatrist Dr. Harrington Challenger. She continues to experience sleep difficulty due to to pain related to fibromyalgia and neuropathy. She expresses less worry about her mother as mother has been wearing her medical alert device. Patient also is becoming excited as she will be retiring next month. She has begun to make plans and is looking forward to going on vacation  in July. Patient shares more information today regarding her marriage and her pattern of worry. She continues to express frustration regarding her husband who suffers from PTSD. Per patient's report, he can be very insensitive to her feelings. She also continues to worry about both of her adult children and blames self for the children not being as independent and self-sufficient as she expects.   Suicidal/Homicidal: No  Therapist Response: Therapist works with patient to identify and verbalize feelings, to explore her patterns in relationships and discuss boundary issues, to explore coping and relaxation techniques  Plan: Return again in 2 weeks.  Diagnosis: Axis I: Generalized anxiety disorder, depressive disorder    Axis II: Deferred    Dandrea Medders, LCSW 09/05/2013

## 2013-09-07 ENCOUNTER — Telehealth (HOSPITAL_COMMUNITY): Payer: Self-pay | Admitting: *Deleted

## 2013-09-21 ENCOUNTER — Telehealth (HOSPITAL_COMMUNITY): Payer: Self-pay | Admitting: *Deleted

## 2013-09-23 ENCOUNTER — Ambulatory Visit (HOSPITAL_COMMUNITY): Payer: Self-pay | Admitting: Psychiatry

## 2013-09-23 ENCOUNTER — Other Ambulatory Visit (HOSPITAL_COMMUNITY): Payer: Self-pay | Admitting: Psychiatry

## 2013-09-23 MED ORDER — BUPROPION HCL ER (XL) 150 MG PO TB24: 150.0000 mg | ORAL_TABLET | ORAL | Status: DC

## 2013-09-23 NOTE — Telephone Encounter (Signed)
Changed to wellbutrin xl 150 mg qam

## 2013-09-27 ENCOUNTER — Ambulatory Visit (INDEPENDENT_AMBULATORY_CARE_PROVIDER_SITE_OTHER): Payer: BC Managed Care – PPO | Admitting: Psychiatry

## 2013-09-27 DIAGNOSIS — F411 Generalized anxiety disorder: Secondary | ICD-10-CM

## 2013-09-27 NOTE — Progress Notes (Addendum)
   THERAPIST PROGRESS NOTE  Session Time:  Tuesday 09/27/2013 9:10 AM - 9:55 AM  Participation Level: Active  Behavioral Response: Well GroomedAlertEuthymic  Type of Therapy: Individual Therapy  Treatment Goals addressed:  Improve assertiveness skills and the ability to set and maintain boundaries      Increase self-acceptance      Improve ability to manage stress and anxiety      Increase involvement in healthy reciprocal relationships  Interventions: CBT and Supportive  Summary: Mindy Ellis is a 63 y.o. female who is referred for services by psychiatrist Dr. Harrington Challenger due to patient experiencing symptoms of anxiety and depression. Per patient's report symptoms have been present for several years. She experiences low energy, fatigue, and lack of interest in activities. She reports several stressors including her relationship with her husband who is a [place] veteran and suffers from PTSD. She also worries about her 49 year old mother who resides alone and does not always wear her emergency medical alert device. She has fallen in the past and patient fears something may happen to mother. Patient worries about her two adult sons and blames herself for sons not being as self-sufficient as she expects. She reports a pattern of feeling like everything is her fault Patient ruminates over the past regarding sister and niece using her mother's retirement account. Patient states becoming anxious and overwhelmed even over small tasks such as laundry. She has frequent crying spells but tends to internalize her feelings.   Since last session, patient reports improved mood and decreased stress. She is excited about retiring this week. She has improved self-care and has begun to set boundaries. She reports difficulty saying no but increasing her efforts to set boundaries with family as members are already beginning to have expectations from her upon her retirement. Patient shares more information regarding her  childhood and the relationship with her mother. She expresses concern as well as frustration about mother.    Suicidal/Homicidal: No  Therapist Response: Therapist works with patient to develop treatment plan, process feelings, identify ways to improve self-care and maintain consistency and balance.  Plan: Return again in 3 weeks.  Diagnosis: Axis I: Generalized Anxiety Disorder    Axis II: No diagnosis    BYNUM,PEGGY, LCSW 09/27/2013

## 2013-09-27 NOTE — Patient Instructions (Signed)
Discussed orally 

## 2013-10-18 ENCOUNTER — Ambulatory Visit (INDEPENDENT_AMBULATORY_CARE_PROVIDER_SITE_OTHER): Payer: BC Managed Care – PPO | Admitting: Psychiatry

## 2013-10-18 DIAGNOSIS — F411 Generalized anxiety disorder: Secondary | ICD-10-CM

## 2013-10-18 NOTE — Patient Instructions (Signed)
Discussed orally 

## 2013-10-18 NOTE — Progress Notes (Signed)
   THERAPIST PROGRESS NOTE  Session Time: Tuesday 10/18/2013 2:00 PM - 2:55 PM  Participation Level: Active  Behavioral Response: Well GroomedAlertAnxious and Depressed/tearful  Type of Therapy: Individual Therapy  Treatment Goals addressed: mprove assertiveness skills and the ability to set and maintain boundaries  Increase self-acceptance  Improve ability to manage stress and anxiety  Increase involvement in healthy reciprocal relationships   Interventions: Supportive  Summary: Mindy Ellis is a 63 y.o. female who is referred for services by psychiatrist Dr. Harrington Challenger due to patient experiencing symptoms of anxiety and depression. Per patient's report symptoms have been present for several years. She experiences low energy, fatigue, and lack of interest in activities. She reports several stressors including her relationship with her husband who is a [place] veteran and suffers from PTSD. She also worries about her 64 year old mother who resides alone and does not always wear her emergency medical alert device. She has fallen in the past and patient fears something may happen to mother. Patient worries about her two adult sons and blames herself for sons not being as self-sufficient as she expects. She reports a pattern of feeling like everything is her fault Patient ruminates over the past regarding sister and niece using her mother's retirement account. Patient states becoming anxious and overwhelmed even over small tasks such as laundry. She has frequent crying spells but tends to internalize her feelings.  Patient reports increased stress in the past 2 weeks as mother became sick and was hospitalized for three days. This occurred just after patient retired and just before patient was planning to go on vacation with her family. Patient expresses concern about her mother as well as disappointment regarding effects on plans. She continues to worry about mother but has tried to set boundaries with  mother. She also is trying to set boundaries with her children. She feels overwhelmed and has neglected self-care efforts as well as time for self. However, she is trying to increase efforts and is planning to go out with a friend today.    Suicidal/Homicidal: No  Therapist Response: Therapist works with patient to process feelings, identify ways to resume self-care efforts, review relaxation techniques, identify ways to set/maintain boundaries, began to explore thought patterns and effects on mood/behavior.  Plan: Return again in 3 weeks.  Diagnosis: Axis I: GAD, Depressive Disorder    Axis II: No diagnosis    Niccolo Burggraf, LCSW 10/18/2013

## 2013-10-21 ENCOUNTER — Ambulatory Visit (INDEPENDENT_AMBULATORY_CARE_PROVIDER_SITE_OTHER): Payer: BC Managed Care – PPO | Admitting: Psychiatry

## 2013-10-21 ENCOUNTER — Encounter (HOSPITAL_COMMUNITY): Payer: Self-pay | Admitting: Psychiatry

## 2013-10-21 VITALS — BP 130/70 | Ht 64.0 in | Wt 173.0 lb

## 2013-10-21 DIAGNOSIS — F411 Generalized anxiety disorder: Secondary | ICD-10-CM

## 2013-10-21 MED ORDER — ALPRAZOLAM 0.25 MG PO TABS: 0.2500 mg | ORAL_TABLET | Freq: Two times a day (BID) | ORAL | Status: DC

## 2013-10-21 MED ORDER — ESCITALOPRAM OXALATE 10 MG PO TABS: 10.0000 mg | ORAL_TABLET | Freq: Every day | ORAL | Status: DC

## 2013-10-21 NOTE — Progress Notes (Signed)
Patient ID: Mindy Ellis, female   DOB: [birth year], 63 y.o.   MRN: [id number] Patient ID: Mindy Ellis, female   DOB: [birth year], 63 y.o.   MRN: [id number]  Psychiatric Assessment Adult  Patient Identification:  Mindy Ellis Date of Evaluation:  10/21/2013 Chief Complaint: "I worry about everyone in my family." History of Chief Complaint:   Chief Complaint  Patient presents with  . Anxiety  . Depression  . Follow-up    Anxiety Symptoms include nervous/anxious behavior.     this patient is a 63 year old married white female lives with her husband in [place]. 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  The patient was referred by Dr. Hilma Favors for further evaluation of anxiety and depression.  The patient states that she's is sort of person who always worries about everyone else. She had a difficult childhood. She never knew her biological father and her mother remarried when she was very young. Her stepfather was often out drinking or cheating on her mother and her mother worked a lot. The patient was responsible for a younger sister who is 5 years younger. She's not been the victim of abuse but did feel like she was neglected to her about her childhood.  The patient's husband has PTSD from the [place] War. At times he gets angry and agitated. She's taking care of an 69 year old mother who has some episodes of confusion and still lives by herself. She also worries about both of her grown sons have had financial issues. The patient herself will be retiring in June and she wonders how she is going to be spending her time. She was recently diagnosed with neuropathy and was placed on Neurontin which is helped quite a bit with the leg pain. For a while she stopped exercising and now feels like she's ready to go back to it.  She's not significantly depressed but more anxious and worried. She also has fibromyalgia with  chronic body aches and low energy. She feels like the Wellbutrin she is on has helped to some degree but she still lacks get up and go. She's never been suicidal and doesn't have any past psychiatric history. She does not use drugs or alcohol.  The patient returns after four-week's. She is now off Neurontin and is no longer retaining fluid. However she is more depressed in the Wellbutrin is not helping. She is retired from her job but still is very stressed. The second day of retirement her mother got to the hospital because of a low oxygen level. She is the only one really taking care of her mother. She states that Wellbutrin is not agreeing with her causing hair loss irritability crying spells and abdominal pain. I told her we would go ahead and stop it and try Lexapro. She also may need a slightly higher dose of Xanax to help her anxiety. She denies suicidal ideation Review of Systems  Constitutional: Positive for activity change.  HENT: Negative.   Eyes: Negative.   Respiratory: Negative.   Cardiovascular: Negative.   Gastrointestinal: Negative.   Endocrine: Negative.   Genitourinary: Negative.   Musculoskeletal: Positive for arthralgias and myalgias.  Skin: Negative.   Allergic/Immunologic: Negative.   Neurological: Negative.   Psychiatric/Behavioral: Positive for dysphoric mood. The patient is nervous/anxious.    Physical Exam not done  Depressive Symptoms: depressed mood, anhedonia, psychomotor retardation, fatigue, anxiety,  (Hypo) Manic Symptoms:   Elevated Mood:  No Irritable Mood:  No Grandiosity:  No Distractibility:  Yes Labiality of Mood:  No Delusions:  No Hallucinations:  No Impulsivity:  No Sexually Inappropriate Behavior:  No Financial Extravagance:  No Flight of Ideas:  No  Anxiety Symptoms: Excessive Worry:  Yes Panic Symptoms:  No Agoraphobia:  No Obsessive Compulsive: No  Symptoms: None, Specific Phobias:  No Social Anxiety:  No  Psychotic  Symptoms:  Hallucinations: No None Delusions:  No Paranoia:  No   Ideas of Reference:  No  PTSD Symptoms: Ever had a traumatic exposure:  No Had a traumatic exposure in the last month:  No Re-experiencing: No None Hypervigilance:  No Hyperarousal: No None Avoidance: No None  Traumatic Brain Injury: No   Past Psychiatric History: Diagnosis: Depression, anxiety   Hospitalizations: none  Outpatient Care: Only through primary care   Substance Abuse Care: none  Self-Mutilation: no  Suicidal Attempts: none  Violent Behaviors: none   Past Medical History:   Past Medical History  Diagnosis Date  . GERD (gastroesophageal reflux disease)   . Acid reflux   . Dysphagia   . Chronic diarrhea   . Abdominal pain   . DDD (degenerative disc disease)   . Hypertension   . Diabetes mellitus without complication     diet controlled  . Fibromyalgia   . PONV (postoperative nausea and vomiting)     nausea, pt must not vomit due to reflux surgery  . Meningioma    History of Loss of Consciousness:  No Seizure History:  No Cardiac History:  No Allergies:   Allergies  Allergen Reactions  . Mirtazapine Nausea Only  . Naproxen Sodium     Upsets stomach  . Other     Torcan=caused signs of stroke,  vioxx=headache  . Statins Other (See Comments)    Muscle aches  . Zolpidem Tartrate Other (See Comments)    Ambien= confusion  . Erythromycin Rash  . Penicillins Rash  . Sulfur Rash  . Tetracyclines & Related Rash   Current Medications:  Current Outpatient Prescriptions  Medication Sig Dispense Refill  . acetaminophen (TYLENOL) 500 MG tablet Take 500 mg by mouth every 6 (six) hours as needed for pain.      Marland Kitchen ALPRAZolam (XANAX) 0.25 MG tablet Take 1 tablet (0.25 mg total) by mouth 2 (two) times daily. For sleep  60 tablet  2  . escitalopram (LEXAPRO) 10 MG tablet Take 1 tablet (10 mg total) by mouth daily.  30 tablet  2  . fish oil-omega-3 fatty acids 1000 MG capsule Take 1 g by mouth  daily.       . hydrochlorothiazide (HYDRODIURIL) 25 MG tablet Take 25 mg by mouth every morning.      Marland Kitchen HYDROcodone-acetaminophen (NORCO) 5-325 MG per tablet Take 1-2 tablets by mouth every 6 (six) hours as needed for pain.  30 tablet  0  . loratadine (CLARITIN) 10 MG tablet Take 10 mg by mouth every morning.       . metFORMIN (GLUCOPHAGE) 500 MG tablet Take by mouth 2 (two) times daily with a meal.      . Probiotic Product (ALIGN) 4 MG CAPS Take 4 mg by mouth daily.      . traZODone (DESYREL) 100 MG tablet Take 100 mg by mouth at bedtime.        . [DISCONTINUED] fexofenadine (ALLEGRA) 180 MG tablet Take by mouth daily.        . [DISCONTINUED] mirtazapine (REMERON) 15 MG tablet Take 15 mg by mouth at bedtime.        . [  DISCONTINUED] omeprazole (PRILOSEC) 20 MG capsule Take 20 mg by mouth daily.        . [DISCONTINUED] promethazine (PHENERGAN) 25 MG tablet Take 25 mg by mouth every 6 (six) hours as needed.         No current facility-administered medications for this visit.    Previous Psychotropic Medications:  Medication Dose                          Substance Abuse History in the last 12 months: Substance Age of 1st Use Last Use Amount Specific Type  Nicotine      Alcohol      Cannabis      Opiates      Cocaine      Methamphetamines      LSD      Ecstasy      Benzodiazepines      Caffeine      Inhalants      Others:                          Medical Consequences of Substance Abuse: n/a  Legal Consequences of Substance Abuse: n/a  Family Consequences of Substance Abuse: n/a  Blackouts:  No DT's:  No Withdrawal Symptoms:  No None  Social History: Current Place of Residence: [place] of Birth: [place] Family Members: Husband, 2 children, 3 grandchildren, mother Marital Status:  Married Children:   Sons: 2  Daughters:  Relationships: Education:  Dentist Problems/Performance: Religious  Beliefs/Practices: Christian History of Abuse: none Ship broker History:  None. Legal History: none Hobbies/Interests: Dancing, traveling  Family History:   Family History  Problem Relation Age of Onset  . Depression Mother   . Anxiety disorder Maternal Grandmother   . Depression Maternal Grandmother   . Drug abuse Other     Mental Status Examination/Evaluation: Objective:  Appearance: Neat and Well Groomed  Eye Contact::  Good  Speech:  Clear and Coherent  Volume:  Normal  Mood:  Anxious somewhat depressed   Affect: Worried, dysphoric   Thought Process:  Circumstantial  Orientation:  Full (Time, Place, and Person)  Thought Content:  Rumination  Suicidal Thoughts:  No  Homicidal Thoughts:  No  Judgement:  Good  Insight:  Good  Psychomotor Activity:  Normal  Akathisia:  No  Handed:  Right  AIMS (if indicated):    Assets:  Communication Skills Desire for Improvement Resilience    Laboratory/X-Ray Psychological Evaluation(s)   Chart reviewed      Assessment:  Axis I: Depressive Disorder NOS and Generalized Anxiety Disorder  AXIS I Depressive Disorder NOS and Generalized Anxiety Disorder  AXIS II Deferred  AXIS III Past Medical History  Diagnosis Date  . GERD (gastroesophageal reflux disease)   . Acid reflux   . Dysphagia   . Chronic diarrhea   . Abdominal pain   . DDD (degenerative disc disease)   . Hypertension   . Diabetes mellitus without complication     diet controlled  . Fibromyalgia   . PONV (postoperative nausea and vomiting)     nausea, pt must not vomit due to reflux surgery  . Meningioma      AXIS IV other psychosocial or environmental problems and problems with primary support group  AXIS V 61-70 mild symptoms   Treatment Plan/Recommendations:  Plan of Care: Medication management   Laboratory  Psychotherapy: She is going to  start seeing Maurice Small here   Medications: She'll discontinue Wellbutrin and start Lexapro  10 mg every morning and continue trazodone 50 mg each bedtime. She'll increase Xanax to 0.25 mg twice a day   Routine PRN Medications:  No  Consultations:   Safety Concerns:    Other:  She'll return in [place], [place], MD 7/17/201510:42 AM

## 2013-10-24 ENCOUNTER — Ambulatory Visit (HOSPITAL_COMMUNITY): Payer: Self-pay | Admitting: Psychiatry

## 2013-11-08 ENCOUNTER — Ambulatory Visit (HOSPITAL_COMMUNITY): Payer: Self-pay | Admitting: Psychiatry

## 2013-11-21 ENCOUNTER — Encounter (HOSPITAL_COMMUNITY): Payer: Self-pay | Admitting: Psychiatry

## 2013-11-21 ENCOUNTER — Ambulatory Visit (INDEPENDENT_AMBULATORY_CARE_PROVIDER_SITE_OTHER): Payer: Medicare Other | Admitting: Psychiatry

## 2013-11-21 VITALS — BP 104/69 | HR 92 | Ht 64.0 in | Wt 173.4 lb

## 2013-11-21 DIAGNOSIS — F329 Major depressive disorder, single episode, unspecified: Secondary | ICD-10-CM

## 2013-11-21 DIAGNOSIS — F3289 Other specified depressive episodes: Secondary | ICD-10-CM

## 2013-11-21 DIAGNOSIS — F411 Generalized anxiety disorder: Secondary | ICD-10-CM | POA: Diagnosis not present

## 2013-11-21 MED ORDER — TRAZODONE HCL 100 MG PO TABS: 50.0000 mg | ORAL_TABLET | Freq: Every day | ORAL | Status: DC

## 2013-11-21 MED ORDER — ALPRAZOLAM 0.25 MG PO TABS: ORAL_TABLET | ORAL | Status: DC

## 2013-11-21 NOTE — Progress Notes (Signed)
Patient ID: Mindy Ellis, female   DOB: 10-24-1950, 63 y.o.   MRN: [id number] Patient ID: Mindy Ellis, female   DOB: [birth year], 63 y.o.   MRN: [id number] Patient ID: Mindy Ellis, female   DOB: 20-Aug-1950, 64 y.o.   MRN: [id number]  Psychiatric Assessment Adult  Patient Identification:  DELICIA Ellis Date of Evaluation:  11/21/2013 Chief Complaint: "I worry about everyone in my family." History of Chief Complaint:   Chief Complaint  Patient presents with  . Anxiety  . Depression  . Follow-up    Anxiety Symptoms include nervous/anxious behavior.     this patient is a 63 year old married white female lives with her husband in [place]. 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  The patient was referred by Dr. Hilma Favors for further evaluation of anxiety and depression.  The patient states that she's is sort of person who always worries about everyone else. She had a difficult childhood. She never knew her biological father and her mother remarried when she was very young. Her stepfather was often out drinking or cheating on her mother and her mother worked a lot. The patient was responsible for a younger sister who is 5 years younger. She's not been the victim of abuse but did feel like she was neglected to her about her childhood.  The patient's husband has PTSD from the [place] War. At times he gets angry and agitated. She's taking care of an 54 year old mother who has some episodes of confusion and still lives by herself. She also worries about both of her grown sons have had financial issues. The patient herself will be retiring in June and she wonders how she is going to be spending her time. She was recently diagnosed with neuropathy and was placed on Neurontin which is helped quite a bit with the leg pain. For a while she stopped exercising and now feels like she's ready to go back to it.  She's not  significantly depressed but more anxious and worried. She also has fibromyalgia with chronic body aches and low energy. She feels like the Wellbutrin she is on has helped to some degree but she still lacks get up and go. She's never been suicidal and doesn't have any past psychiatric history. She does not use drugs or alcohol.  The patient returns after four-week's. She is more stressed because her mother has been in and out of the hospital again with dehydration. She's not really depressed but is anxious. She stopped the Wellbutrin because of making her angry and mean. I told her perhaps we could just go up a little bit on the Xanax to 3 a day and she is in agreement. She also needs to take time for herself Review of Systems  Constitutional: Positive for activity change.  HENT: Negative.   Eyes: Negative.   Respiratory: Negative.   Cardiovascular: Negative.   Gastrointestinal: Negative.   Endocrine: Negative.   Genitourinary: Negative.   Musculoskeletal: Positive for arthralgias and myalgias.  Skin: Negative.   Allergic/Immunologic: Negative.   Neurological: Negative.   Psychiatric/Behavioral: Positive for dysphoric mood. The patient is nervous/anxious.    Physical Exam not done  Depressive Symptoms: depressed mood, anhedonia, psychomotor retardation, fatigue, anxiety,  (Hypo) Manic Symptoms:   Elevated Mood:  No Irritable Mood:  No Grandiosity:  No Distractibility:  Yes Labiality of Mood:  No Delusions:  No Hallucinations:  No Impulsivity:  No Sexually Inappropriate Behavior:  No  Financial Extravagance:  No Flight of Ideas:  No  Anxiety Symptoms: Excessive Worry:  Yes Panic Symptoms:  No Agoraphobia:  No Obsessive Compulsive: No  Symptoms: None, Specific Phobias:  No Social Anxiety:  No  Psychotic Symptoms:  Hallucinations: No None Delusions:  No Paranoia:  No   Ideas of Reference:  No  PTSD Symptoms: Ever had a traumatic exposure:  No Had a traumatic  exposure in the last month:  No Re-experiencing: No None Hypervigilance:  No Hyperarousal: No None Avoidance: No None  Traumatic Brain Injury: No   Past Psychiatric History: Diagnosis: Depression, anxiety   Hospitalizations: none  Outpatient Care: Only through primary care   Substance Abuse Care: none  Self-Mutilation: no  Suicidal Attempts: none  Violent Behaviors: none   Past Medical History:   Past Medical History  Diagnosis Date  . GERD (gastroesophageal reflux disease)   . Acid reflux   . Dysphagia   . Chronic diarrhea   . Abdominal pain   . DDD (degenerative disc disease)   . Hypertension   . Diabetes mellitus without complication     diet controlled  . Fibromyalgia   . PONV (postoperative nausea and vomiting)     nausea, pt must not vomit due to reflux surgery  . Meningioma    History of Loss of Consciousness:  No Seizure History:  No Cardiac History:  No Allergies:   Allergies  Allergen Reactions  . Mirtazapine Nausea Only  . Naproxen Sodium     Upsets stomach  . Other     Torcan=caused signs of stroke,  vioxx=headache  . Statins Other (See Comments)    Muscle aches  . Zolpidem Tartrate Other (See Comments)    Ambien= confusion  . Erythromycin Rash  . Penicillins Rash  . Sulfur Rash  . Tetracyclines & Related Rash   Current Medications:  Current Outpatient Prescriptions  Medication Sig Dispense Refill  . acetaminophen (TYLENOL) 500 MG tablet Take 500 mg by mouth every 6 (six) hours as needed for pain.      Marland Kitchen ALPRAZolam (XANAX) 0.25 MG tablet Take one 3 times a day  90 tablet  2  . fish oil-omega-3 fatty acids 1000 MG capsule Take 1 g by mouth daily.       . hydrochlorothiazide (HYDRODIURIL) 25 MG tablet Take 25 mg by mouth every morning.      . loratadine (CLARITIN) 10 MG tablet Take 10 mg by mouth every morning.       . metFORMIN (GLUCOPHAGE) 500 MG tablet Take 250 mg by mouth 2 (two) times daily with a meal.       . Probiotic Product (ALIGN) 4  MG CAPS Take 4 mg by mouth daily.      . traZODone (DESYREL) 100 MG tablet Take 0.5 tablets (50 mg total) by mouth at bedtime.  30 tablet  2  . [DISCONTINUED] fexofenadine (ALLEGRA) 180 MG tablet Take by mouth daily.        . [DISCONTINUED] mirtazapine (REMERON) 15 MG tablet Take 15 mg by mouth at bedtime.        . [DISCONTINUED] omeprazole (PRILOSEC) 20 MG capsule Take 20 mg by mouth daily.        . [DISCONTINUED] promethazine (PHENERGAN) 25 MG tablet Take 25 mg by mouth every 6 (six) hours as needed.         No current facility-administered medications for this visit.    Previous Psychotropic Medications:  Medication Dose  Substance Abuse History in the last 12 months: Substance Age of 1st Use Last Use Amount Specific Type  Nicotine      Alcohol      Cannabis      Opiates      Cocaine      Methamphetamines      LSD      Ecstasy      Benzodiazepines      Caffeine      Inhalants      Others:                          Medical Consequences of Substance Abuse: n/a  Legal Consequences of Substance Abuse: n/a  Family Consequences of Substance Abuse: n/a  Blackouts:  No DT's:  No Withdrawal Symptoms:  No None  Social History: Current Place of Residence: [place] of Birth: [place] Family Members: Husband, 2 children, 3 grandchildren, mother Marital Status:  Married Children:   Sons: 2  Daughters:  Relationships: Education:  Dentist Problems/Performance: Religious Beliefs/Practices: Christian History of Abuse: none Ship broker History:  None. Legal History: none Hobbies/Interests: Dancing, traveling  Family History:   Family History  Problem Relation Age of Onset  . Depression Mother   . Anxiety disorder Maternal Grandmother   . Depression Maternal Grandmother   . Drug abuse Other     Mental Status Examination/Evaluation: Objective:  Appearance: Neat  and Well Groomed  Eye Contact::  Good  Speech:  Clear and Coherent  Volume:  Normal  Mood:  Anxious    Affect: Worried   Thought Process:  Circumstantial  Orientation:  Full (Time, Place, and Person)  Thought Content:  Rumination  Suicidal Thoughts:  No  Homicidal Thoughts:  No  Judgement:  Good  Insight:  Good  Psychomotor Activity:  Normal  Akathisia:  No  Handed:  Right  AIMS (if indicated):    Assets:  Communication Skills Desire for Improvement Resilience    Laboratory/X-Ray Psychological Evaluation(s)   Chart reviewed      Assessment:  Axis I: Depressive Disorder NOS and Generalized Anxiety Disorder  AXIS I Depressive Disorder NOS and Generalized Anxiety Disorder  AXIS II Deferred  AXIS III Past Medical History  Diagnosis Date  . GERD (gastroesophageal reflux disease)   . Acid reflux   . Dysphagia   . Chronic diarrhea   . Abdominal pain   . DDD (degenerative disc disease)   . Hypertension   . Diabetes mellitus without complication     diet controlled  . Fibromyalgia   . PONV (postoperative nausea and vomiting)     nausea, pt must not vomit due to reflux surgery  . Meningioma      AXIS IV other psychosocial or environmental problems and problems with primary support group  AXIS V 61-70 mild symptoms   Treatment Plan/Recommendations:  Plan of Care: Medication management   Laboratory  Psychotherapy: She is going to start seeing Maurice Small here   Medications:  continue trazodone 50 mg each bedtime. She'll increase Xanax to 0.25 mg three times a day   Routine PRN Medications:  No  Consultations:   Safety Concerns:    Other:  She'll return in 6 -weeks     Levonne Spiller, MD 8/17/20159:50 AM

## 2013-11-28 ENCOUNTER — Ambulatory Visit (INDEPENDENT_AMBULATORY_CARE_PROVIDER_SITE_OTHER): Payer: Medicare Other | Admitting: Psychiatry

## 2013-11-28 ENCOUNTER — Telehealth (HOSPITAL_COMMUNITY): Payer: Self-pay | Admitting: *Deleted

## 2013-11-28 DIAGNOSIS — F411 Generalized anxiety disorder: Secondary | ICD-10-CM | POA: Diagnosis not present

## 2013-11-28 NOTE — Telephone Encounter (Signed)
pt states she is taking Xanax different from what was written for her. Pt states she have not filled it. Pt states she is taking 0.25 mg in the morning and 0.50 mg at night. Pt would like to know if this way is ok and if she could get a new script stating this new SIG or if she should just refill the script she have right now. Script pt have right now is Take 3 tablet Daily. Pt cell is [phone] and home number is [phone].

## 2013-11-29 NOTE — Telephone Encounter (Signed)
Pt is aware of Dr. Harrington Challenger advise and shows understanding and agrees

## 2013-11-29 NOTE — Telephone Encounter (Signed)
Tell her she can refill script since the quantity is unchanged

## 2013-11-29 NOTE — Patient Instructions (Signed)
Discussed orally 

## 2013-11-29 NOTE — Progress Notes (Signed)
   THERAPIST PROGRESS NOTE  Session Time:  Tuesday 11/28/2013 4:10 PM - 4:45 PM  Participation Level: Active  Behavioral Response: CasualAlertAnxious  Type of Therapy: Individual Therapy  Treatment Goals addressed:    Improve assertiveness skills and the ability to set and maintain boundaries        Increase self-acceptance        Improve ability to manage stress and anxiety        Increase involvement in healthy reciprocal relationships     Interventions: CBT and Supportive  Summary: Mindy Ellis is a 63 y.o. female who is referred for services by psychiatrist Dr. Harrington Challenger due to patient experiencing symptoms of anxiety and depression. Per patient's report symptoms have been present for several years. She experiences low energy, fatigue, and lack of interest in activities. She reports several stressors including her relationship with her husband who is a [place] veteran and suffers from PTSD. She also worries about her 71 year old mother who resides alone and does not always wear her emergency medical alert device. She has fallen in the past and patient fears something may happen to mother. Patient worries about her two adult sons and blames herself for sons not being as self-sufficient as she expects. She reports a pattern of feeling like everything is her fault Patient ruminates over the past regarding sister and niece using her mother's retirement account. Patient states becoming anxious and overwhelmed even over small tasks such as laundry. She has frequent crying spells but tends to internalize her feelings.  Patient reports feeling better since last session. She experienced increased stress and anxiety a few weeks ago when her mother was hospitalized for 8 days. She is relieved mother now is home. Patient reports increased acceptance of mother's choices and has improved ability to distinguish her responsibilities versus others' responsibilities. Patient reports improved use of assertiveness  skills as well as setting and maintaining boundaries. She cites several examples. She is no longer taking anti- depressant and denies any depression but reports continued anxiety although the intensity and frequency of this has decreased. She states feeling better about self and is pleased with her progress Patient has improved self-care efforts, maintained involvement in activity, and increased social involvement. She also reports decreased marital stress as she and husband have improved communication.   Suicidal/Homicidal: No  Therapist Response: Therapist works with patient to process feelings, review progress on goals, discuss benefits of being assertive, begin to identify triggers of anxiety, identify 3 channels of emotions and ways to intervene,   Plan: Return again in 3 weeks. Patient agrees to complete anxiety logs and bring to next session.  Diagnosis: Axis I: Generalized Anxiety Disorder    Axis II: no diagnosis    Caytlin Better, LCSW 11/29/2013

## 2013-12-13 ENCOUNTER — Encounter: Payer: Self-pay | Admitting: Orthopedic Surgery

## 2013-12-13 ENCOUNTER — Ambulatory Visit (INDEPENDENT_AMBULATORY_CARE_PROVIDER_SITE_OTHER): Payer: Medicare Other | Admitting: Orthopedic Surgery

## 2013-12-13 ENCOUNTER — Ambulatory Visit (INDEPENDENT_AMBULATORY_CARE_PROVIDER_SITE_OTHER): Payer: Medicare Other

## 2013-12-13 VITALS — BP 134/95 | Ht 65.0 in | Wt 174.5 lb

## 2013-12-13 DIAGNOSIS — M25519 Pain in unspecified shoulder: Secondary | ICD-10-CM

## 2013-12-13 DIAGNOSIS — M25511 Pain in right shoulder: Secondary | ICD-10-CM

## 2013-12-13 DIAGNOSIS — M67919 Unspecified disorder of synovium and tendon, unspecified shoulder: Secondary | ICD-10-CM

## 2013-12-13 DIAGNOSIS — M719 Bursopathy, unspecified: Secondary | ICD-10-CM

## 2013-12-13 DIAGNOSIS — M75101 Unspecified rotator cuff tear or rupture of right shoulder, not specified as traumatic: Secondary | ICD-10-CM

## 2013-12-13 NOTE — Progress Notes (Signed)
Patient ID: Mindy Ellis, female   DOB: May 12, 1950, 63 y.o.   MRN: [id number] Chief Complaint  Patient presents with  . Shoulder Pain    right shoulder pain radiates down arm    63 year-old female presents for evaluation initial evaluation of ongoing right shoulder pain associated with no trauma. Symptoms have been present for several months associated with pain over the right deltoid and also in the medial aspect of the scapula. The pain is constant it is worse with various activities and she does have night symptoms.  Review of systems please see recorded and scan documents. Review of systems reviewed.  Past Medical History  Diagnosis Date  . GERD (gastroesophageal reflux disease)   . Acid reflux   . Dysphagia   . Chronic diarrhea   . Abdominal pain   . DDD (degenerative disc disease)   . Hypertension   . Diabetes mellitus without complication     diet controlled  . Fibromyalgia   . PONV (postoperative nausea and vomiting)     nausea, pt must not vomit due to reflux surgery  . Meningioma    Past Surgical History  Procedure Laterality Date  . Colonoscopy  11/11/07    NUR  . Upper gastrointestinal endoscopy  2006  . Surgery  for acid reflux  1999  . Eye surgery    . Tonsillectomy    . Breast surgery Left 21 yrs ago    benign lump removed  . Cholecystectomy    . Abdominal hysterectomy  age 80  . Appendectomy  age 27    both ovaries removed  . Lasix eye surgery Bilateral   . Cystoscopy N/A 01/18/2013    Procedure: CYSTOSCOPY;  Surgeon: Reece Packer, MD;  Location: [facility];  Service: Urology;  Laterality: N/A;  . Anterior and posterior repair N/A 01/18/2013    Procedure: ANTERIOR (CYSTOCELE) ;  Surgeon: Reece Packer, MD;  Location: [facility];  Service: Urology;  Laterality: N/A;  . Proctoscopy  01/18/2013    Procedure: PROCTOSCOPY;  Surgeon: Reece Packer, MD;  Location: [facility];  Service: Urology;;   The past, family history and social history have been  reviewed and are recorded in the corresponding sections of epic   BP 134/95  Ht 5\' 5"  (1.651 m)  Wt 174 lb 8 oz (79.153 kg)  BMI 29.04 kg/m2 General appearance is normal, the patient is alert and oriented x3 with normal mood and affect. Ambulatory status remains normal  The patient's cervical spine shows no deformity and no tenderness. She has normal range of motion normal muscle tone normal skin overlying the thoracic and cervical spine  Radial and ulnar pulses are normal bilaterally she has no lymphadenopathy in the upper extremity or axilla. Supraclavicular region normal as well. Sensation normal in both upper extremities reflexes 2+ without pathologic findings and coordination and balance are normal  Lower extremity exam The right and left lower extremity:  Inspection and palpation revealed no tenderness or abnormality in alignment in the lower extremities. Range of motion is full.  Strength is grade 5. All joints are stable.  Upper extremity exam  left upper extremity:  Inspection and palpation revealed no abnormalities in the left upper extremities shoulder Range of motion is full without contracture. Motor exam is normal with grade 5 strength. The joints are fully reduced without subluxation. There is no atrophy or tremor and muscle tone is normal.  All joints are stable.     Right shoulder is tender  over the posterior deltoid anterolateral deltoid and medial scapular border. We also find that she has a positive impingement sign and positive Hawkins maneuver but intact rotator cuff in all planes with no active range of motion loss and no instability.  X-rays look good. No fracture or arthritis  Encounter Diagnoses  Name Primary?  . Right shoulder pain   . Rotator cuff syndrome of right shoulder Yes   Shoulder Injection Procedure Note   Pre-operative Diagnosis: right  RC Syndrome  Post-operative Diagnosis: same  Indications: pain   Anesthesia: ethyl chloride    Procedure Details   Verbal consent was obtained for the procedure. The shoulder was prepped withalcohol and the skin was anesthetized. A 20 gauge needle was advanced into the subacromial space through posterior approach without difficulty  The space was then injected with 3 ml 1% lidocaine and 1 ml of depomedrol. The injection site was cleansed with isopropyl alcohol and a dressing was applied.  Complications:  None; patient tolerated the procedure well.  I also recommend Codman exercises.

## 2013-12-13 NOTE — Patient Instructions (Signed)
You have received a steroid shot. 15% of patients experience increased pain at the injection site with in the next 24 hours. This is best treated with ice and tylenol extra strength 2 tabs every 8 hours. If you are still having pain please call the office.   Exercises x 6 weeks

## 2013-12-20 ENCOUNTER — Ambulatory Visit (INDEPENDENT_AMBULATORY_CARE_PROVIDER_SITE_OTHER): Payer: Medicare Other | Admitting: Psychiatry

## 2013-12-20 DIAGNOSIS — F411 Generalized anxiety disorder: Secondary | ICD-10-CM | POA: Diagnosis not present

## 2013-12-20 NOTE — Patient Instructions (Signed)
Discussed orally 

## 2013-12-20 NOTE — Progress Notes (Addendum)
THERAPIST PROGRESS NOTE  Session Time:  Tuesday 12/20/2013 1:05 PM - 1:55 PM  Participation Level: Active  Behavioral Response: CasualAlertEuthymic  Type of Therapy: Individual Therapy  Treatment Goals addressed:  Improve assertiveness skills and the ability to set and maintain boundaries  Increase self-acceptance  Improve ability to manage stress and anxiety  Increase involvement in healthy reciprocal relationships      Interventions: CBT and Supportive  Summary: Mindy Ellis is a 63 y.o. female who presents with is referred for services by psychiatrist Dr. Harrington Challenger due to patient experiencing symptoms of anxiety and depression. Per patient's report symptoms have been present for several years. She experiences low energy, fatigue, and lack of interest in activities. She reports several stressors including her relationship with her husband who is a [place] veteran and suffers from PTSD. She also worries about her 43 year old mother who resides alone and does not always wear her emergency medical alert device. She has fallen in the past and patient fears something may happen to mother. Patient worries about her two adult sons and blames herself for sons not being as self-sufficient as she expects. She reports a pattern of feeling like everything is her fault Patient ruminates over the past regarding sister and niece using her mother's retirement account. Patient states becoming anxious and overwhelmed even over small tasks such as laundry. She has frequent crying spells but tends to internalize her feelings.   Patient reports improved mood and decreased anxiety since last session. She has continued to experience stressful situations but has been using healthy coping skills. She has been mindful of thought patterns and has improved ability to identify and challenge thinking errors resulting in decreased perfectionistic tendencies and increased realistic expectations of self. She also cites  several examples of successful use of assertiveness skills along with setting, maintaining, and respecting boundaries. Patient reports decreased worry about mother and other family members. Patient has improved self-care and self-nurture. She has increased social involvement with family and friends. Patient is positive about self and progress.      Suicidal/Homicidal: No  Therapist Response: Therapist works with patient to process feelings, discussing patient's progress, and identify ways to maintain consistency in self-care, self nurture, and use of healthy coping skills,   Plan: Patient and therapist agreed patient has accomplished her goals. Therefore, therapy services will be discontinued. Patient is encouraged to contact this practice should she need therapy services in the future. Patient will continue to see psychiatrist Dr. Harrington Challenger for medication management.  Diagnosis: Axis I: Generalized Anxiety Disorder    Axis II: No diagnosis    BYNUM,PEGGY, LCSW 12/20/2013              Outpatient Therapist Discharge Summary  Mindy Ellis    11/06/1950   Admission Date: 08/03/2013  Discharge Date: 12/20/2013  Reason for Discharge: Treatment completed  Diagnosis:  Axis I:  Generalized anxiety disorder   Axis II:  None       Axis III: Past Medical History    GERD (gastroesophageal reflux disease)   Acid reflux    Dysphagia    Chronic diarrhea    Abdominal pain    DDD (degenerative disc disease)    Hypertension    Diabetes mellitus without complication diet controlled   Fibromyalgia    PONV (postoperative nausea and vomiting) nausea, pt must not vomit due to reflux surgery    Meningioma     Axis IV:  Problems with primary support group   Axis V:  81-90  Comments: Patient will continue to see psychiatrist Dr. Harrington Challenger for medication management. Patient is encouraged to call this practice should she need therapy services in the future.  Peggy Bynum LCSW

## 2014-01-02 ENCOUNTER — Encounter (HOSPITAL_COMMUNITY): Payer: Self-pay | Admitting: Psychiatry

## 2014-01-02 ENCOUNTER — Ambulatory Visit (INDEPENDENT_AMBULATORY_CARE_PROVIDER_SITE_OTHER): Payer: Medicare Other | Admitting: Psychiatry

## 2014-01-02 VITALS — BP 110/65 | HR 95 | Ht 65.0 in | Wt 176.0 lb

## 2014-01-02 DIAGNOSIS — F329 Major depressive disorder, single episode, unspecified: Secondary | ICD-10-CM

## 2014-01-02 DIAGNOSIS — F3289 Other specified depressive episodes: Secondary | ICD-10-CM | POA: Diagnosis not present

## 2014-01-02 DIAGNOSIS — F411 Generalized anxiety disorder: Secondary | ICD-10-CM | POA: Diagnosis not present

## 2014-01-02 MED ORDER — TRAZODONE HCL 100 MG PO TABS: 100.0000 mg | ORAL_TABLET | Freq: Every day | ORAL | Status: DC

## 2014-01-02 NOTE — Progress Notes (Signed)
Patient ID: Mindy Ellis, female   DOB: [birth year], 63 y.o.   MRN: [id number] Patient ID: Mindy Ellis, female   DOB: Jun 15, 1950, 63 y.o.   MRN: [id number] Patient ID: Mindy Ellis, female   DOB: [birth year], 63 y.o.   MRN: [id number] Patient ID: Mindy Ellis, female   DOB: February 09, 1951, 63 y.o.   MRN: [id number]  Psychiatric Assessment Adult  Patient Identification:  Mindy Ellis Date of Evaluation:  01/02/2014 Chief Complaint: "I worry about everyone in my family." History of Chief Complaint:   Chief Complaint  Patient presents with  . Anxiety  . Depression  . Follow-up    Anxiety Symptoms include nervous/anxious behavior.     this patient is a 63 year old married white female lives with her husband in [place]. 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  The patient was referred by Dr. Hilma Favors for further evaluation of anxiety and depression.  The patient states that she's is sort of person who always worries about everyone else. She had a difficult childhood. She never knew her biological father and her mother remarried when she was very young. Her stepfather was often out drinking or cheating on her mother and her mother worked a lot. The patient was responsible for a younger sister who is 5 years younger. She's not been the victim of abuse but did feel like she was neglected to her about her childhood.  The patient's husband has PTSD from the [place] War. At times he gets angry and agitated. She's taking care of an 28 year old mother who has some episodes of confusion and still lives by herself. She also worries about both of her grown sons have had financial issues. The patient herself will be retiring in June and she wonders how she is going to be spending her time. She was recently diagnosed with neuropathy and was placed on Neurontin which is helped quite a bit with the leg pain. For a while she stopped  exercising and now feels like she's ready to go back to it.  She's not significantly depressed but more anxious and worried. She also has fibromyalgia with chronic body aches and low energy. She feels like the Wellbutrin she is on has helped to some degree but she still lacks get up and go. She's never been suicidal and doesn't have any past psychiatric history. She does not use drugs or alcohol.  The patient returns after four-week's. She's doing much better. She's only using the Xanax at bedtime. She is sleeping well with the trazodone. She still has body aches that may be consistent with fibromyalgia but her mood is improved. She's trying to pace herself more and not worry about everyone else so much Review of Systems  Constitutional: Positive for activity change.  HENT: Negative.   Eyes: Negative.   Respiratory: Negative.   Cardiovascular: Negative.   Gastrointestinal: Negative.   Endocrine: Negative.   Genitourinary: Negative.   Musculoskeletal: Positive for arthralgias and myalgias.  Skin: Negative.   Allergic/Immunologic: Negative.   Neurological: Negative.   Psychiatric/Behavioral: Positive for dysphoric mood. The patient is nervous/anxious.    Physical Exam not done  Depressive Symptoms: depressed mood, anhedonia, psychomotor retardation, fatigue, anxiety,  (Hypo) Manic Symptoms:   Elevated Mood:  No Irritable Mood:  No Grandiosity:  No Distractibility:  Yes Labiality of Mood:  No Delusions:  No Hallucinations:  No Impulsivity:  No Sexually Inappropriate Behavior:  No Financial Extravagance:  No  Flight of Ideas:  No  Anxiety Symptoms: Excessive Worry:  Yes Panic Symptoms:  No Agoraphobia:  No Obsessive Compulsive: No  Symptoms: None, Specific Phobias:  No Social Anxiety:  No  Psychotic Symptoms:  Hallucinations: No None Delusions:  No Paranoia:  No   Ideas of Reference:  No  PTSD Symptoms: Ever had a traumatic exposure:  No Had a traumatic exposure  in the last month:  No Re-experiencing: No None Hypervigilance:  No Hyperarousal: No None Avoidance: No None  Traumatic Brain Injury: No   Past Psychiatric History: Diagnosis: Depression, anxiety   Hospitalizations: none  Outpatient Care: Only through primary care   Substance Abuse Care: none  Self-Mutilation: no  Suicidal Attempts: none  Violent Behaviors: none   Past Medical History:   Past Medical History  Diagnosis Date  . GERD (gastroesophageal reflux disease)   . Acid reflux   . Dysphagia   . Chronic diarrhea   . Abdominal pain   . DDD (degenerative disc disease)   . Hypertension   . Diabetes mellitus without complication     diet controlled  . Fibromyalgia   . PONV (postoperative nausea and vomiting)     nausea, pt must not vomit due to reflux surgery  . Meningioma    History of Loss of Consciousness:  No Seizure History:  No Cardiac History:  No Allergies:   Allergies  Allergen Reactions  . Mirtazapine Nausea Only  . Naproxen Sodium     Upsets stomach  . Other     Torcan=caused signs of stroke,  vioxx=headache  . Statins Other (See Comments)    Muscle aches  . Zolpidem Tartrate Other (See Comments)    Ambien= confusion  . Erythromycin Rash  . Penicillins Rash  . Sulfur Rash  . Tetracyclines & Related Rash   Current Medications:  Current Outpatient Prescriptions  Medication Sig Dispense Refill  . acetaminophen (TYLENOL) 500 MG tablet Take 500 mg by mouth every 6 (six) hours as needed for pain.      . Calcium Carbonate-Vitamin D (CALCIUM + D PO) Take 600 mg by mouth daily.      . hydrochlorothiazide (HYDRODIURIL) 25 MG tablet Take 25 mg by mouth every morning.      . loratadine (CLARITIN) 10 MG tablet Take 10 mg by mouth every morning.       Marland Kitchen MAGNESIUM PO Take by mouth.      . metFORMIN (GLUCOPHAGE) 500 MG tablet Take 250 mg by mouth 2 (two) times daily with a meal.       . Probiotic Product (ALIGN) 4 MG CAPS Take 4 mg by mouth daily.      .  traZODone (DESYREL) 100 MG tablet Take 1 tablet (100 mg total) by mouth at bedtime.  30 tablet  2  . vitamin B-12 (CYANOCOBALAMIN) 100 MCG tablet Take 100 mcg by mouth daily.      . [DISCONTINUED] fexofenadine (ALLEGRA) 180 MG tablet Take by mouth daily.        . [DISCONTINUED] mirtazapine (REMERON) 15 MG tablet Take 15 mg by mouth at bedtime.        . [DISCONTINUED] omeprazole (PRILOSEC) 20 MG capsule Take 20 mg by mouth daily.        . [DISCONTINUED] promethazine (PHENERGAN) 25 MG tablet Take 25 mg by mouth every 6 (six) hours as needed.         No current facility-administered medications for this visit.    Previous Psychotropic Medications:  Medication Dose  Substance Abuse History in the last 12 months: Substance Age of 1st Use Last Use Amount Specific Type  Nicotine      Alcohol      Cannabis      Opiates      Cocaine      Methamphetamines      LSD      Ecstasy      Benzodiazepines      Caffeine      Inhalants      Others:                          Medical Consequences of Substance Abuse: n/a  Legal Consequences of Substance Abuse: n/a  Family Consequences of Substance Abuse: n/a  Blackouts:  No DT's:  No Withdrawal Symptoms:  No None  Social History: Current Place of Residence: [place] of Birth: [place] Family Members: Husband, 2 children, 3 grandchildren, mother Marital Status:  Married Children:   Sons: 2  Daughters:  Relationships: Education:  Dentist Problems/Performance: Religious Beliefs/Practices: Christian History of Abuse: none Ship broker History:  None. Legal History: none Hobbies/Interests: Dancing, traveling  Family History:   Family History  Problem Relation Age of Onset  . Depression Mother   . Anxiety disorder Maternal Grandmother   . Depression Maternal Grandmother   . Drug abuse Other     Mental Status  Examination/Evaluation: Objective:  Appearance: Neat and Well Groomed  Eye Contact::  Good  Speech:  Clear and Coherent  Volume:  Normal  Mood:  good  Affect: Bright   Thought Process:  Circumstantial  Orientation:  Full (Time, Place, and Person)  Thought Content:  Rumination  Suicidal Thoughts:  No  Homicidal Thoughts:  No  Judgement:  Good  Insight:  Good  Psychomotor Activity:  Normal  Akathisia:  No  Handed:  Right  AIMS (if indicated):    Assets:  Communication Skills Desire for Improvement Resilience    Laboratory/X-Ray Psychological Evaluation(s)   Chart reviewed      Assessment:  Axis I: Depressive Disorder NOS and Generalized Anxiety Disorder  AXIS I Depressive Disorder NOS and Generalized Anxiety Disorder  AXIS II Deferred  AXIS III Past Medical History  Diagnosis Date  . GERD (gastroesophageal reflux disease)   . Acid reflux   . Dysphagia   . Chronic diarrhea   . Abdominal pain   . DDD (degenerative disc disease)   . Hypertension   . Diabetes mellitus without complication     diet controlled  . Fibromyalgia   . PONV (postoperative nausea and vomiting)     nausea, pt must not vomit due to reflux surgery  . Meningioma      AXIS IV other psychosocial or environmental problems and problems with primary support group  AXIS V 61-70 mild symptoms   Treatment Plan/Recommendations:  Plan of Care: Medication management   Laboratory  Psychotherapy: She is going to start seeing Maurice Small here   Medications:  continue trazodone 50 mg each bedtime and Xanax to 0.5mg  each bedtime   Routine PRN Medications:  No  Consultations:   Safety Concerns:    Other:  She'll return in 3 months     Levonne Spiller, MD 9/28/201511:50 AM

## 2014-01-04 DIAGNOSIS — M25579 Pain in unspecified ankle and joints of unspecified foot: Secondary | ICD-10-CM | POA: Diagnosis not present

## 2014-01-04 DIAGNOSIS — E1149 Type 2 diabetes mellitus with other diabetic neurological complication: Secondary | ICD-10-CM | POA: Diagnosis not present

## 2014-01-04 DIAGNOSIS — M79609 Pain in unspecified limb: Secondary | ICD-10-CM | POA: Diagnosis not present

## 2014-01-09 DIAGNOSIS — Z6830 Body mass index (BMI) 30.0-30.9, adult: Secondary | ICD-10-CM | POA: Diagnosis not present

## 2014-01-09 DIAGNOSIS — E782 Mixed hyperlipidemia: Secondary | ICD-10-CM | POA: Diagnosis not present

## 2014-01-09 DIAGNOSIS — R7301 Impaired fasting glucose: Secondary | ICD-10-CM | POA: Diagnosis not present

## 2014-01-09 DIAGNOSIS — E6609 Other obesity due to excess calories: Secondary | ICD-10-CM | POA: Diagnosis not present

## 2014-01-09 DIAGNOSIS — Z23 Encounter for immunization: Secondary | ICD-10-CM | POA: Diagnosis not present

## 2014-01-09 DIAGNOSIS — I10 Essential (primary) hypertension: Secondary | ICD-10-CM | POA: Diagnosis not present

## 2014-01-20 ENCOUNTER — Other Ambulatory Visit: Payer: Self-pay

## 2014-02-03 ENCOUNTER — Telehealth (HOSPITAL_COMMUNITY): Payer: Self-pay | Admitting: *Deleted

## 2014-02-09 DIAGNOSIS — M791 Myalgia: Secondary | ICD-10-CM | POA: Diagnosis not present

## 2014-02-09 DIAGNOSIS — R5383 Other fatigue: Secondary | ICD-10-CM | POA: Diagnosis not present

## 2014-02-09 DIAGNOSIS — M2550 Pain in unspecified joint: Secondary | ICD-10-CM | POA: Diagnosis not present

## 2014-02-10 ENCOUNTER — Telehealth (HOSPITAL_COMMUNITY): Payer: Self-pay | Admitting: *Deleted

## 2014-02-16 NOTE — Telephone Encounter (Signed)
Called pt and lmtcb to get a little more explanation on why she no longer want to resch appt with office and to find out who will be taking over her medications. Number provided.

## 2014-03-27 DIAGNOSIS — E785 Hyperlipidemia, unspecified: Secondary | ICD-10-CM | POA: Diagnosis not present

## 2014-03-27 DIAGNOSIS — E782 Mixed hyperlipidemia: Secondary | ICD-10-CM | POA: Diagnosis not present

## 2014-03-27 DIAGNOSIS — Z0000 Encounter for general adult medical examination without abnormal findings: Secondary | ICD-10-CM | POA: Diagnosis not present

## 2014-03-27 DIAGNOSIS — E663 Overweight: Secondary | ICD-10-CM | POA: Diagnosis not present

## 2014-03-27 DIAGNOSIS — Z6829 Body mass index (BMI) 29.0-29.9, adult: Secondary | ICD-10-CM | POA: Diagnosis not present

## 2014-04-04 ENCOUNTER — Ambulatory Visit (HOSPITAL_COMMUNITY): Payer: Self-pay | Admitting: Psychiatry

## 2014-04-10 DIAGNOSIS — N3020 Other chronic cystitis without hematuria: Secondary | ICD-10-CM | POA: Diagnosis not present

## 2014-04-10 DIAGNOSIS — N281 Cyst of kidney, acquired: Secondary | ICD-10-CM | POA: Diagnosis not present

## 2014-04-10 DIAGNOSIS — N8111 Cystocele, midline: Secondary | ICD-10-CM | POA: Diagnosis not present

## 2014-05-10 DIAGNOSIS — E663 Overweight: Secondary | ICD-10-CM | POA: Diagnosis not present

## 2014-05-10 DIAGNOSIS — Z6829 Body mass index (BMI) 29.0-29.9, adult: Secondary | ICD-10-CM | POA: Diagnosis not present

## 2014-05-10 DIAGNOSIS — F329 Major depressive disorder, single episode, unspecified: Secondary | ICD-10-CM | POA: Diagnosis not present

## 2014-05-29 DIAGNOSIS — N941 Dyspareunia: Secondary | ICD-10-CM | POA: Diagnosis not present

## 2014-06-13 DIAGNOSIS — H5213 Myopia, bilateral: Secondary | ICD-10-CM | POA: Diagnosis not present

## 2014-06-13 DIAGNOSIS — E119 Type 2 diabetes mellitus without complications: Secondary | ICD-10-CM | POA: Diagnosis not present

## 2014-06-16 ENCOUNTER — Other Ambulatory Visit (HOSPITAL_COMMUNITY): Payer: Self-pay | Admitting: Family Medicine

## 2014-06-16 DIAGNOSIS — Z6831 Body mass index (BMI) 31.0-31.9, adult: Secondary | ICD-10-CM | POA: Diagnosis not present

## 2014-06-16 DIAGNOSIS — Z780 Asymptomatic menopausal state: Secondary | ICD-10-CM

## 2014-06-16 DIAGNOSIS — M353 Polymyalgia rheumatica: Secondary | ICD-10-CM | POA: Diagnosis not present

## 2014-06-16 DIAGNOSIS — M8580 Other specified disorders of bone density and structure, unspecified site: Secondary | ICD-10-CM

## 2014-06-16 DIAGNOSIS — E6609 Other obesity due to excess calories: Secondary | ICD-10-CM | POA: Diagnosis not present

## 2014-06-20 ENCOUNTER — Other Ambulatory Visit (HOSPITAL_COMMUNITY): Payer: Self-pay

## 2014-06-23 ENCOUNTER — Other Ambulatory Visit (HOSPITAL_COMMUNITY): Payer: Self-pay

## 2014-06-27 ENCOUNTER — Ambulatory Visit (HOSPITAL_COMMUNITY)
Admission: RE | Admit: 2014-06-27 | Discharge: 2014-06-27 | Disposition: A | Discharge status: Home / Self Care | Payer: Medicare Other | Source: Ambulatory Visit | Attending: Family Medicine | Admitting: Family Medicine

## 2014-06-27 DIAGNOSIS — M8580 Other specified disorders of bone density and structure, unspecified site: Secondary | ICD-10-CM | POA: Diagnosis not present

## 2014-06-27 DIAGNOSIS — Z780 Asymptomatic menopausal state: Secondary | ICD-10-CM | POA: Diagnosis not present

## 2014-06-27 DIAGNOSIS — M859 Disorder of bone density and structure, unspecified: Secondary | ICD-10-CM | POA: Diagnosis not present

## 2014-08-16 DIAGNOSIS — L821 Other seborrheic keratosis: Secondary | ICD-10-CM | POA: Diagnosis not present

## 2014-08-16 DIAGNOSIS — D225 Melanocytic nevi of trunk: Secondary | ICD-10-CM | POA: Diagnosis not present

## 2014-08-16 DIAGNOSIS — L570 Actinic keratosis: Secondary | ICD-10-CM | POA: Diagnosis not present

## 2014-08-16 DIAGNOSIS — D485 Neoplasm of uncertain behavior of skin: Secondary | ICD-10-CM | POA: Diagnosis not present

## 2014-09-19 DIAGNOSIS — D320 Benign neoplasm of cerebral meninges: Secondary | ICD-10-CM | POA: Diagnosis not present

## 2014-09-22 DIAGNOSIS — D320 Benign neoplasm of cerebral meninges: Secondary | ICD-10-CM | POA: Diagnosis not present

## 2014-09-22 DIAGNOSIS — D329 Benign neoplasm of meninges, unspecified: Secondary | ICD-10-CM | POA: Diagnosis not present

## 2014-09-26 ENCOUNTER — Other Ambulatory Visit (HOSPITAL_COMMUNITY): Payer: Self-pay | Admitting: Family Medicine

## 2014-09-26 DIAGNOSIS — Z1231 Encounter for screening mammogram for malignant neoplasm of breast: Secondary | ICD-10-CM

## 2014-10-02 ENCOUNTER — Other Ambulatory Visit: Payer: Self-pay

## 2014-10-02 DIAGNOSIS — E119 Type 2 diabetes mellitus without complications: Secondary | ICD-10-CM | POA: Diagnosis not present

## 2014-10-02 DIAGNOSIS — Z1389 Encounter for screening for other disorder: Secondary | ICD-10-CM | POA: Diagnosis not present

## 2014-10-02 DIAGNOSIS — E559 Vitamin D deficiency, unspecified: Secondary | ICD-10-CM | POA: Diagnosis not present

## 2014-10-02 DIAGNOSIS — I10 Essential (primary) hypertension: Secondary | ICD-10-CM | POA: Diagnosis not present

## 2014-10-02 DIAGNOSIS — E782 Mixed hyperlipidemia: Secondary | ICD-10-CM | POA: Diagnosis not present

## 2014-10-02 DIAGNOSIS — Z6829 Body mass index (BMI) 29.0-29.9, adult: Secondary | ICD-10-CM | POA: Diagnosis not present

## 2014-10-02 DIAGNOSIS — E663 Overweight: Secondary | ICD-10-CM | POA: Diagnosis not present

## 2014-10-16 ENCOUNTER — Ambulatory Visit (HOSPITAL_COMMUNITY)
Admission: RE | Admit: 2014-10-16 | Discharge: 2014-10-16 | Disposition: A | Discharge status: Home / Self Care | Payer: Medicare Other | Source: Ambulatory Visit | Attending: Family Medicine | Admitting: Family Medicine

## 2014-10-16 DIAGNOSIS — Z1231 Encounter for screening mammogram for malignant neoplasm of breast: Secondary | ICD-10-CM | POA: Diagnosis not present

## 2014-10-26 DIAGNOSIS — E663 Overweight: Secondary | ICD-10-CM | POA: Diagnosis not present

## 2014-10-26 DIAGNOSIS — M797 Fibromyalgia: Secondary | ICD-10-CM | POA: Diagnosis not present

## 2014-10-26 DIAGNOSIS — Z6829 Body mass index (BMI) 29.0-29.9, adult: Secondary | ICD-10-CM | POA: Diagnosis not present

## 2014-10-26 DIAGNOSIS — Z1389 Encounter for screening for other disorder: Secondary | ICD-10-CM | POA: Diagnosis not present

## 2014-11-29 DIAGNOSIS — E1142 Type 2 diabetes mellitus with diabetic polyneuropathy: Secondary | ICD-10-CM | POA: Diagnosis not present

## 2014-11-29 DIAGNOSIS — M6700 Short Achilles tendon (acquired), unspecified ankle: Secondary | ICD-10-CM | POA: Diagnosis not present

## 2014-11-29 DIAGNOSIS — M79673 Pain in unspecified foot: Secondary | ICD-10-CM | POA: Diagnosis not present

## 2015-01-04 DIAGNOSIS — E1140 Type 2 diabetes mellitus with diabetic neuropathy, unspecified: Secondary | ICD-10-CM | POA: Diagnosis not present

## 2015-01-04 DIAGNOSIS — G600 Hereditary motor and sensory neuropathy: Secondary | ICD-10-CM | POA: Diagnosis not present

## 2015-01-04 DIAGNOSIS — M79671 Pain in right foot: Secondary | ICD-10-CM | POA: Diagnosis not present

## 2015-01-04 DIAGNOSIS — M79672 Pain in left foot: Secondary | ICD-10-CM | POA: Diagnosis not present

## 2015-02-01 DIAGNOSIS — M79672 Pain in left foot: Secondary | ICD-10-CM | POA: Diagnosis not present

## 2015-02-01 DIAGNOSIS — M79671 Pain in right foot: Secondary | ICD-10-CM | POA: Diagnosis not present

## 2015-02-01 DIAGNOSIS — E1140 Type 2 diabetes mellitus with diabetic neuropathy, unspecified: Secondary | ICD-10-CM | POA: Diagnosis not present

## 2015-02-12 DIAGNOSIS — E1140 Type 2 diabetes mellitus with diabetic neuropathy, unspecified: Secondary | ICD-10-CM | POA: Diagnosis not present

## 2015-02-12 DIAGNOSIS — Z1389 Encounter for screening for other disorder: Secondary | ICD-10-CM | POA: Diagnosis not present

## 2015-02-12 DIAGNOSIS — E782 Mixed hyperlipidemia: Secondary | ICD-10-CM | POA: Diagnosis not present

## 2015-02-12 DIAGNOSIS — E119 Type 2 diabetes mellitus without complications: Secondary | ICD-10-CM | POA: Diagnosis not present

## 2015-02-12 DIAGNOSIS — E6609 Other obesity due to excess calories: Secondary | ICD-10-CM | POA: Diagnosis not present

## 2015-02-12 DIAGNOSIS — Z6830 Body mass index (BMI) 30.0-30.9, adult: Secondary | ICD-10-CM | POA: Diagnosis not present

## 2015-02-12 DIAGNOSIS — G64 Other disorders of peripheral nervous system: Secondary | ICD-10-CM | POA: Diagnosis not present

## 2015-02-12 DIAGNOSIS — I10 Essential (primary) hypertension: Secondary | ICD-10-CM | POA: Diagnosis not present

## 2015-04-26 DIAGNOSIS — M79672 Pain in left foot: Secondary | ICD-10-CM | POA: Diagnosis not present

## 2015-04-26 DIAGNOSIS — M79671 Pain in right foot: Secondary | ICD-10-CM | POA: Diagnosis not present

## 2015-04-26 DIAGNOSIS — B351 Tinea unguium: Secondary | ICD-10-CM | POA: Diagnosis not present

## 2015-04-26 DIAGNOSIS — G600 Hereditary motor and sensory neuropathy: Secondary | ICD-10-CM | POA: Diagnosis not present

## 2015-05-03 DIAGNOSIS — E119 Type 2 diabetes mellitus without complications: Secondary | ICD-10-CM | POA: Diagnosis not present

## 2015-05-03 DIAGNOSIS — I10 Essential (primary) hypertension: Secondary | ICD-10-CM | POA: Diagnosis not present

## 2015-05-03 DIAGNOSIS — Z23 Encounter for immunization: Secondary | ICD-10-CM | POA: Diagnosis not present

## 2015-05-03 DIAGNOSIS — E782 Mixed hyperlipidemia: Secondary | ICD-10-CM | POA: Diagnosis not present

## 2015-05-03 DIAGNOSIS — E6609 Other obesity due to excess calories: Secondary | ICD-10-CM | POA: Diagnosis not present

## 2015-05-03 DIAGNOSIS — E559 Vitamin D deficiency, unspecified: Secondary | ICD-10-CM | POA: Diagnosis not present

## 2015-05-03 DIAGNOSIS — R350 Frequency of micturition: Secondary | ICD-10-CM | POA: Diagnosis not present

## 2015-05-03 DIAGNOSIS — Z6832 Body mass index (BMI) 32.0-32.9, adult: Secondary | ICD-10-CM | POA: Diagnosis not present

## 2015-05-03 DIAGNOSIS — Z0000 Encounter for general adult medical examination without abnormal findings: Secondary | ICD-10-CM | POA: Diagnosis not present

## 2015-05-28 DIAGNOSIS — E119 Type 2 diabetes mellitus without complications: Secondary | ICD-10-CM | POA: Diagnosis not present

## 2015-05-28 DIAGNOSIS — Z79891 Long term (current) use of opiate analgesic: Secondary | ICD-10-CM | POA: Diagnosis not present

## 2015-05-28 DIAGNOSIS — M797 Fibromyalgia: Secondary | ICD-10-CM | POA: Diagnosis not present

## 2015-05-28 DIAGNOSIS — E6609 Other obesity due to excess calories: Secondary | ICD-10-CM | POA: Diagnosis not present

## 2015-05-28 DIAGNOSIS — E559 Vitamin D deficiency, unspecified: Secondary | ICD-10-CM | POA: Diagnosis not present

## 2015-05-28 DIAGNOSIS — Z1389 Encounter for screening for other disorder: Secondary | ICD-10-CM | POA: Diagnosis not present

## 2015-05-28 DIAGNOSIS — Z23 Encounter for immunization: Secondary | ICD-10-CM | POA: Diagnosis not present

## 2015-05-28 DIAGNOSIS — Z6832 Body mass index (BMI) 32.0-32.9, adult: Secondary | ICD-10-CM | POA: Diagnosis not present

## 2015-05-28 DIAGNOSIS — Z0000 Encounter for general adult medical examination without abnormal findings: Secondary | ICD-10-CM | POA: Diagnosis not present

## 2015-06-18 DIAGNOSIS — H5213 Myopia, bilateral: Secondary | ICD-10-CM | POA: Diagnosis not present

## 2015-06-18 DIAGNOSIS — H524 Presbyopia: Secondary | ICD-10-CM | POA: Diagnosis not present

## 2015-06-18 DIAGNOSIS — H52223 Regular astigmatism, bilateral: Secondary | ICD-10-CM | POA: Diagnosis not present

## 2015-06-18 DIAGNOSIS — E119 Type 2 diabetes mellitus without complications: Secondary | ICD-10-CM | POA: Diagnosis not present

## 2015-06-21 DIAGNOSIS — Z1389 Encounter for screening for other disorder: Secondary | ICD-10-CM | POA: Diagnosis not present

## 2015-06-21 DIAGNOSIS — E782 Mixed hyperlipidemia: Secondary | ICD-10-CM | POA: Diagnosis not present

## 2015-06-21 DIAGNOSIS — E6609 Other obesity due to excess calories: Secondary | ICD-10-CM | POA: Diagnosis not present

## 2015-06-21 DIAGNOSIS — M797 Fibromyalgia: Secondary | ICD-10-CM | POA: Diagnosis not present

## 2015-06-21 DIAGNOSIS — Z6832 Body mass index (BMI) 32.0-32.9, adult: Secondary | ICD-10-CM | POA: Diagnosis not present

## 2015-07-09 DIAGNOSIS — L929 Granulomatous disorder of the skin and subcutaneous tissue, unspecified: Secondary | ICD-10-CM | POA: Diagnosis not present

## 2015-07-09 DIAGNOSIS — D485 Neoplasm of uncertain behavior of skin: Secondary | ICD-10-CM | POA: Diagnosis not present

## 2015-07-24 DIAGNOSIS — Z4802 Encounter for removal of sutures: Secondary | ICD-10-CM | POA: Diagnosis not present

## 2015-08-15 DIAGNOSIS — L821 Other seborrheic keratosis: Secondary | ICD-10-CM | POA: Diagnosis not present

## 2015-08-15 DIAGNOSIS — L570 Actinic keratosis: Secondary | ICD-10-CM | POA: Diagnosis not present

## 2015-08-23 DIAGNOSIS — K589 Irritable bowel syndrome without diarrhea: Secondary | ICD-10-CM | POA: Diagnosis not present

## 2015-08-23 DIAGNOSIS — Z6831 Body mass index (BMI) 31.0-31.9, adult: Secondary | ICD-10-CM | POA: Diagnosis not present

## 2015-08-23 DIAGNOSIS — Z1389 Encounter for screening for other disorder: Secondary | ICD-10-CM | POA: Diagnosis not present

## 2015-10-11 DIAGNOSIS — E6609 Other obesity due to excess calories: Secondary | ICD-10-CM | POA: Diagnosis not present

## 2015-10-11 DIAGNOSIS — R58 Hemorrhage, not elsewhere classified: Secondary | ICD-10-CM | POA: Diagnosis not present

## 2015-10-11 DIAGNOSIS — Z6831 Body mass index (BMI) 31.0-31.9, adult: Secondary | ICD-10-CM | POA: Diagnosis not present

## 2015-11-16 DIAGNOSIS — Z1389 Encounter for screening for other disorder: Secondary | ICD-10-CM | POA: Diagnosis not present

## 2015-11-16 DIAGNOSIS — E6609 Other obesity due to excess calories: Secondary | ICD-10-CM | POA: Diagnosis not present

## 2015-11-16 DIAGNOSIS — Z6832 Body mass index (BMI) 32.0-32.9, adult: Secondary | ICD-10-CM | POA: Diagnosis not present

## 2015-11-16 DIAGNOSIS — I10 Essential (primary) hypertension: Secondary | ICD-10-CM | POA: Diagnosis not present

## 2015-11-16 DIAGNOSIS — E119 Type 2 diabetes mellitus without complications: Secondary | ICD-10-CM | POA: Diagnosis not present

## 2015-12-24 ENCOUNTER — Other Ambulatory Visit (HOSPITAL_COMMUNITY): Payer: Self-pay | Admitting: Family Medicine

## 2015-12-24 DIAGNOSIS — Z1231 Encounter for screening mammogram for malignant neoplasm of breast: Secondary | ICD-10-CM

## 2015-12-31 ENCOUNTER — Ambulatory Visit (HOSPITAL_COMMUNITY)
Admission: RE | Admit: 2015-12-31 | Discharge: 2015-12-31 | Disposition: A | Discharge status: Home / Self Care | Payer: Medicare Other | Source: Ambulatory Visit | Attending: Family Medicine | Admitting: Family Medicine

## 2015-12-31 DIAGNOSIS — R928 Other abnormal and inconclusive findings on diagnostic imaging of breast: Secondary | ICD-10-CM | POA: Insufficient documentation

## 2015-12-31 DIAGNOSIS — Z1231 Encounter for screening mammogram for malignant neoplasm of breast: Secondary | ICD-10-CM | POA: Diagnosis not present

## 2016-01-02 ENCOUNTER — Other Ambulatory Visit: Payer: Self-pay | Admitting: Family Medicine

## 2016-01-02 DIAGNOSIS — R928 Other abnormal and inconclusive findings on diagnostic imaging of breast: Secondary | ICD-10-CM

## 2016-01-15 ENCOUNTER — Ambulatory Visit (HOSPITAL_COMMUNITY)
Admission: RE | Admit: 2016-01-15 | Discharge: 2016-01-15 | Disposition: A | Discharge status: Home / Self Care | Payer: Medicare Other | Source: Ambulatory Visit | Attending: Family Medicine | Admitting: Family Medicine

## 2016-01-15 DIAGNOSIS — N6320 Unspecified lump in the left breast, unspecified quadrant: Secondary | ICD-10-CM | POA: Diagnosis not present

## 2016-01-15 DIAGNOSIS — R928 Other abnormal and inconclusive findings on diagnostic imaging of breast: Secondary | ICD-10-CM | POA: Diagnosis present

## 2016-03-14 DIAGNOSIS — E559 Vitamin D deficiency, unspecified: Secondary | ICD-10-CM | POA: Diagnosis not present

## 2016-03-14 DIAGNOSIS — E119 Type 2 diabetes mellitus without complications: Secondary | ICD-10-CM | POA: Diagnosis not present

## 2016-03-14 DIAGNOSIS — E6609 Other obesity due to excess calories: Secondary | ICD-10-CM | POA: Diagnosis not present

## 2016-03-14 DIAGNOSIS — F419 Anxiety disorder, unspecified: Secondary | ICD-10-CM | POA: Diagnosis not present

## 2016-03-14 DIAGNOSIS — K219 Gastro-esophageal reflux disease without esophagitis: Secondary | ICD-10-CM | POA: Diagnosis not present

## 2016-03-14 DIAGNOSIS — Z6831 Body mass index (BMI) 31.0-31.9, adult: Secondary | ICD-10-CM | POA: Diagnosis not present

## 2016-03-14 DIAGNOSIS — Z1389 Encounter for screening for other disorder: Secondary | ICD-10-CM | POA: Diagnosis not present

## 2016-05-12 DIAGNOSIS — Z6830 Body mass index (BMI) 30.0-30.9, adult: Secondary | ICD-10-CM | POA: Diagnosis not present

## 2016-05-12 DIAGNOSIS — M797 Fibromyalgia: Secondary | ICD-10-CM | POA: Diagnosis not present

## 2016-05-12 DIAGNOSIS — E6609 Other obesity due to excess calories: Secondary | ICD-10-CM | POA: Diagnosis not present

## 2016-05-12 DIAGNOSIS — Z1389 Encounter for screening for other disorder: Secondary | ICD-10-CM | POA: Diagnosis not present

## 2016-05-12 DIAGNOSIS — F419 Anxiety disorder, unspecified: Secondary | ICD-10-CM | POA: Diagnosis not present

## 2016-05-12 DIAGNOSIS — Z0000 Encounter for general adult medical examination without abnormal findings: Secondary | ICD-10-CM | POA: Diagnosis not present

## 2016-06-30 DIAGNOSIS — M797 Fibromyalgia: Secondary | ICD-10-CM | POA: Diagnosis not present

## 2016-06-30 DIAGNOSIS — Z6829 Body mass index (BMI) 29.0-29.9, adult: Secondary | ICD-10-CM | POA: Diagnosis not present

## 2016-06-30 DIAGNOSIS — E782 Mixed hyperlipidemia: Secondary | ICD-10-CM | POA: Diagnosis not present

## 2016-06-30 DIAGNOSIS — E119 Type 2 diabetes mellitus without complications: Secondary | ICD-10-CM | POA: Diagnosis not present

## 2016-07-25 DIAGNOSIS — H5213 Myopia, bilateral: Secondary | ICD-10-CM | POA: Diagnosis not present

## 2016-07-25 DIAGNOSIS — E119 Type 2 diabetes mellitus without complications: Secondary | ICD-10-CM | POA: Diagnosis not present

## 2016-07-25 DIAGNOSIS — H52223 Regular astigmatism, bilateral: Secondary | ICD-10-CM | POA: Diagnosis not present

## 2016-07-25 DIAGNOSIS — E1140 Type 2 diabetes mellitus with diabetic neuropathy, unspecified: Secondary | ICD-10-CM | POA: Diagnosis not present

## 2016-07-25 DIAGNOSIS — H524 Presbyopia: Secondary | ICD-10-CM | POA: Diagnosis not present

## 2016-08-18 DIAGNOSIS — D2370 Other benign neoplasm of skin of unspecified lower limb, including hip: Secondary | ICD-10-CM | POA: Diagnosis not present

## 2016-08-18 DIAGNOSIS — L821 Other seborrheic keratosis: Secondary | ICD-10-CM | POA: Diagnosis not present

## 2016-08-18 DIAGNOSIS — L570 Actinic keratosis: Secondary | ICD-10-CM | POA: Diagnosis not present

## 2016-08-29 DIAGNOSIS — N342 Other urethritis: Secondary | ICD-10-CM | POA: Diagnosis not present

## 2016-08-29 DIAGNOSIS — R350 Frequency of micturition: Secondary | ICD-10-CM | POA: Diagnosis not present

## 2016-08-29 DIAGNOSIS — G64 Other disorders of peripheral nervous system: Secondary | ICD-10-CM | POA: Diagnosis not present

## 2016-08-29 DIAGNOSIS — Z6830 Body mass index (BMI) 30.0-30.9, adult: Secondary | ICD-10-CM | POA: Diagnosis not present

## 2016-10-09 DIAGNOSIS — E782 Mixed hyperlipidemia: Secondary | ICD-10-CM | POA: Diagnosis not present

## 2016-10-09 DIAGNOSIS — E6609 Other obesity due to excess calories: Secondary | ICD-10-CM | POA: Diagnosis not present

## 2016-10-09 DIAGNOSIS — Z6830 Body mass index (BMI) 30.0-30.9, adult: Secondary | ICD-10-CM | POA: Diagnosis not present

## 2016-10-09 DIAGNOSIS — M797 Fibromyalgia: Secondary | ICD-10-CM | POA: Diagnosis not present

## 2016-10-09 DIAGNOSIS — E1140 Type 2 diabetes mellitus with diabetic neuropathy, unspecified: Secondary | ICD-10-CM | POA: Diagnosis not present

## 2016-12-01 DIAGNOSIS — D320 Benign neoplasm of cerebral meninges: Secondary | ICD-10-CM | POA: Diagnosis not present

## 2016-12-03 DIAGNOSIS — D329 Benign neoplasm of meninges, unspecified: Secondary | ICD-10-CM | POA: Diagnosis not present

## 2016-12-03 DIAGNOSIS — D320 Benign neoplasm of cerebral meninges: Secondary | ICD-10-CM | POA: Diagnosis not present

## 2016-12-11 DIAGNOSIS — R030 Elevated blood-pressure reading, without diagnosis of hypertension: Secondary | ICD-10-CM | POA: Diagnosis not present

## 2016-12-11 DIAGNOSIS — Z6830 Body mass index (BMI) 30.0-30.9, adult: Secondary | ICD-10-CM | POA: Diagnosis not present

## 2016-12-11 DIAGNOSIS — D320 Benign neoplasm of cerebral meninges: Secondary | ICD-10-CM | POA: Diagnosis not present

## 2016-12-30 ENCOUNTER — Other Ambulatory Visit (HOSPITAL_COMMUNITY): Payer: Self-pay | Admitting: Family Medicine

## 2016-12-30 DIAGNOSIS — Z1231 Encounter for screening mammogram for malignant neoplasm of breast: Secondary | ICD-10-CM

## 2017-02-02 ENCOUNTER — Ambulatory Visit (HOSPITAL_COMMUNITY)
Admission: RE | Admit: 2017-02-02 | Discharge: 2017-02-02 | Disposition: A | Discharge status: Home / Self Care | Payer: Medicare Other | Source: Ambulatory Visit | Attending: Family Medicine | Admitting: Family Medicine

## 2017-02-02 ENCOUNTER — Encounter (HOSPITAL_COMMUNITY): Payer: Self-pay

## 2017-02-02 DIAGNOSIS — Z1231 Encounter for screening mammogram for malignant neoplasm of breast: Secondary | ICD-10-CM | POA: Diagnosis not present

## 2017-05-26 DIAGNOSIS — G4730 Sleep apnea, unspecified: Secondary | ICD-10-CM | POA: Diagnosis not present

## 2017-05-26 DIAGNOSIS — F419 Anxiety disorder, unspecified: Secondary | ICD-10-CM | POA: Diagnosis not present

## 2017-05-26 DIAGNOSIS — G64 Other disorders of peripheral nervous system: Secondary | ICD-10-CM | POA: Diagnosis not present

## 2017-05-26 DIAGNOSIS — I10 Essential (primary) hypertension: Secondary | ICD-10-CM | POA: Diagnosis not present

## 2017-05-26 DIAGNOSIS — E6609 Other obesity due to excess calories: Secondary | ICD-10-CM | POA: Diagnosis not present

## 2017-05-26 DIAGNOSIS — M797 Fibromyalgia: Secondary | ICD-10-CM | POA: Diagnosis not present

## 2017-05-26 DIAGNOSIS — Z6831 Body mass index (BMI) 31.0-31.9, adult: Secondary | ICD-10-CM | POA: Diagnosis not present

## 2017-05-26 DIAGNOSIS — E876 Hypokalemia: Secondary | ICD-10-CM | POA: Diagnosis not present

## 2017-05-26 DIAGNOSIS — E559 Vitamin D deficiency, unspecified: Secondary | ICD-10-CM | POA: Diagnosis not present

## 2017-05-26 DIAGNOSIS — K589 Irritable bowel syndrome without diarrhea: Secondary | ICD-10-CM | POA: Diagnosis not present

## 2017-05-26 DIAGNOSIS — E119 Type 2 diabetes mellitus without complications: Secondary | ICD-10-CM | POA: Diagnosis not present

## 2017-05-26 DIAGNOSIS — J302 Other seasonal allergic rhinitis: Secondary | ICD-10-CM | POA: Diagnosis not present

## 2017-05-26 DIAGNOSIS — Z1389 Encounter for screening for other disorder: Secondary | ICD-10-CM | POA: Diagnosis not present

## 2017-07-08 DIAGNOSIS — L308 Other specified dermatitis: Secondary | ICD-10-CM | POA: Diagnosis not present

## 2017-07-08 DIAGNOSIS — L309 Dermatitis, unspecified: Secondary | ICD-10-CM | POA: Diagnosis not present

## 2017-07-08 DIAGNOSIS — D485 Neoplasm of uncertain behavior of skin: Secondary | ICD-10-CM | POA: Diagnosis not present

## 2017-08-17 DIAGNOSIS — L570 Actinic keratosis: Secondary | ICD-10-CM | POA: Diagnosis not present

## 2017-08-17 DIAGNOSIS — L821 Other seborrheic keratosis: Secondary | ICD-10-CM | POA: Diagnosis not present

## 2017-08-17 DIAGNOSIS — D2370 Other benign neoplasm of skin of unspecified lower limb, including hip: Secondary | ICD-10-CM | POA: Diagnosis not present

## 2017-08-18 DIAGNOSIS — J302 Other seasonal allergic rhinitis: Secondary | ICD-10-CM | POA: Diagnosis not present

## 2017-08-18 DIAGNOSIS — K589 Irritable bowel syndrome without diarrhea: Secondary | ICD-10-CM | POA: Diagnosis not present

## 2017-08-18 DIAGNOSIS — R300 Dysuria: Secondary | ICD-10-CM | POA: Diagnosis not present

## 2017-08-18 DIAGNOSIS — Z0001 Encounter for general adult medical examination with abnormal findings: Secondary | ICD-10-CM | POA: Diagnosis not present

## 2017-08-18 DIAGNOSIS — F419 Anxiety disorder, unspecified: Secondary | ICD-10-CM | POA: Diagnosis not present

## 2017-08-18 DIAGNOSIS — R002 Palpitations: Secondary | ICD-10-CM | POA: Diagnosis not present

## 2017-08-18 DIAGNOSIS — M797 Fibromyalgia: Secondary | ICD-10-CM | POA: Diagnosis not present

## 2017-08-18 DIAGNOSIS — E559 Vitamin D deficiency, unspecified: Secondary | ICD-10-CM | POA: Diagnosis not present

## 2017-08-18 DIAGNOSIS — Z6832 Body mass index (BMI) 32.0-32.9, adult: Secondary | ICD-10-CM | POA: Diagnosis not present

## 2017-08-18 DIAGNOSIS — Z1389 Encounter for screening for other disorder: Secondary | ICD-10-CM | POA: Diagnosis not present

## 2017-08-18 DIAGNOSIS — G64 Other disorders of peripheral nervous system: Secondary | ICD-10-CM | POA: Diagnosis not present

## 2017-08-24 DIAGNOSIS — R300 Dysuria: Secondary | ICD-10-CM | POA: Diagnosis not present

## 2017-09-16 DIAGNOSIS — N390 Urinary tract infection, site not specified: Secondary | ICD-10-CM | POA: Diagnosis not present

## 2017-10-06 DIAGNOSIS — H52223 Regular astigmatism, bilateral: Secondary | ICD-10-CM | POA: Diagnosis not present

## 2017-10-06 DIAGNOSIS — H5213 Myopia, bilateral: Secondary | ICD-10-CM | POA: Diagnosis not present

## 2017-10-06 DIAGNOSIS — E119 Type 2 diabetes mellitus without complications: Secondary | ICD-10-CM | POA: Diagnosis not present

## 2017-10-06 DIAGNOSIS — H524 Presbyopia: Secondary | ICD-10-CM | POA: Diagnosis not present

## 2017-10-06 DIAGNOSIS — E1140 Type 2 diabetes mellitus with diabetic neuropathy, unspecified: Secondary | ICD-10-CM | POA: Diagnosis not present

## 2017-11-02 DIAGNOSIS — M79672 Pain in left foot: Secondary | ICD-10-CM | POA: Diagnosis not present

## 2017-11-02 DIAGNOSIS — M25579 Pain in unspecified ankle and joints of unspecified foot: Secondary | ICD-10-CM | POA: Diagnosis not present

## 2017-11-02 DIAGNOSIS — M79671 Pain in right foot: Secondary | ICD-10-CM | POA: Diagnosis not present

## 2017-11-30 DIAGNOSIS — M79671 Pain in right foot: Secondary | ICD-10-CM | POA: Diagnosis not present

## 2017-11-30 DIAGNOSIS — M79672 Pain in left foot: Secondary | ICD-10-CM | POA: Diagnosis not present

## 2017-11-30 DIAGNOSIS — M25579 Pain in unspecified ankle and joints of unspecified foot: Secondary | ICD-10-CM | POA: Diagnosis not present

## 2017-12-28 DIAGNOSIS — M79672 Pain in left foot: Secondary | ICD-10-CM | POA: Diagnosis not present

## 2017-12-28 DIAGNOSIS — M79671 Pain in right foot: Secondary | ICD-10-CM | POA: Diagnosis not present

## 2017-12-28 DIAGNOSIS — M25579 Pain in unspecified ankle and joints of unspecified foot: Secondary | ICD-10-CM | POA: Diagnosis not present

## 2017-12-30 DIAGNOSIS — G64 Other disorders of peripheral nervous system: Secondary | ICD-10-CM | POA: Diagnosis not present

## 2017-12-30 DIAGNOSIS — E6609 Other obesity due to excess calories: Secondary | ICD-10-CM | POA: Diagnosis not present

## 2017-12-30 DIAGNOSIS — E119 Type 2 diabetes mellitus without complications: Secondary | ICD-10-CM | POA: Diagnosis not present

## 2017-12-30 DIAGNOSIS — E1140 Type 2 diabetes mellitus with diabetic neuropathy, unspecified: Secondary | ICD-10-CM | POA: Diagnosis not present

## 2017-12-30 DIAGNOSIS — E559 Vitamin D deficiency, unspecified: Secondary | ICD-10-CM | POA: Diagnosis not present

## 2017-12-30 DIAGNOSIS — J302 Other seasonal allergic rhinitis: Secondary | ICD-10-CM | POA: Diagnosis not present

## 2017-12-30 DIAGNOSIS — Z6831 Body mass index (BMI) 31.0-31.9, adult: Secondary | ICD-10-CM | POA: Diagnosis not present

## 2017-12-30 DIAGNOSIS — M797 Fibromyalgia: Secondary | ICD-10-CM | POA: Diagnosis not present

## 2017-12-30 DIAGNOSIS — I10 Essential (primary) hypertension: Secondary | ICD-10-CM | POA: Diagnosis not present

## 2017-12-30 DIAGNOSIS — E782 Mixed hyperlipidemia: Secondary | ICD-10-CM | POA: Diagnosis not present

## 2017-12-30 DIAGNOSIS — J309 Allergic rhinitis, unspecified: Secondary | ICD-10-CM | POA: Diagnosis not present

## 2017-12-30 DIAGNOSIS — G4730 Sleep apnea, unspecified: Secondary | ICD-10-CM | POA: Diagnosis not present

## 2017-12-30 DIAGNOSIS — M549 Dorsalgia, unspecified: Secondary | ICD-10-CM | POA: Diagnosis not present

## 2018-02-01 DIAGNOSIS — M25579 Pain in unspecified ankle and joints of unspecified foot: Secondary | ICD-10-CM | POA: Diagnosis not present

## 2018-02-01 DIAGNOSIS — M79672 Pain in left foot: Secondary | ICD-10-CM | POA: Diagnosis not present

## 2018-02-01 DIAGNOSIS — M79671 Pain in right foot: Secondary | ICD-10-CM | POA: Diagnosis not present

## 2018-02-17 DIAGNOSIS — Z9889 Other specified postprocedural states: Secondary | ICD-10-CM | POA: Diagnosis not present

## 2018-02-17 DIAGNOSIS — R1310 Dysphagia, unspecified: Secondary | ICD-10-CM | POA: Diagnosis not present

## 2018-02-17 DIAGNOSIS — Z1211 Encounter for screening for malignant neoplasm of colon: Secondary | ICD-10-CM | POA: Diagnosis not present

## 2018-03-02 DIAGNOSIS — M25579 Pain in unspecified ankle and joints of unspecified foot: Secondary | ICD-10-CM | POA: Diagnosis not present

## 2018-03-02 DIAGNOSIS — M79671 Pain in right foot: Secondary | ICD-10-CM | POA: Diagnosis not present

## 2018-03-02 DIAGNOSIS — M79672 Pain in left foot: Secondary | ICD-10-CM | POA: Diagnosis not present

## 2018-03-10 ENCOUNTER — Other Ambulatory Visit (HOSPITAL_COMMUNITY): Payer: Self-pay | Admitting: Family Medicine

## 2018-03-10 DIAGNOSIS — Z1231 Encounter for screening mammogram for malignant neoplasm of breast: Secondary | ICD-10-CM

## 2018-03-15 ENCOUNTER — Ambulatory Visit (HOSPITAL_COMMUNITY)
Admission: RE | Admit: 2018-03-15 | Discharge: 2018-03-15 | Disposition: A | Discharge status: Home / Self Care | Payer: Medicare Other | Source: Ambulatory Visit | Attending: Family Medicine | Admitting: Family Medicine

## 2018-03-15 DIAGNOSIS — Z1231 Encounter for screening mammogram for malignant neoplasm of breast: Secondary | ICD-10-CM

## 2018-03-22 DIAGNOSIS — Z9889 Other specified postprocedural states: Secondary | ICD-10-CM | POA: Diagnosis not present

## 2018-03-22 DIAGNOSIS — K210 Gastro-esophageal reflux disease with esophagitis: Secondary | ICD-10-CM | POA: Diagnosis not present

## 2018-03-22 DIAGNOSIS — K2950 Unspecified chronic gastritis without bleeding: Secondary | ICD-10-CM | POA: Diagnosis not present

## 2018-03-22 DIAGNOSIS — R1314 Dysphagia, pharyngoesophageal phase: Secondary | ICD-10-CM | POA: Diagnosis not present

## 2018-03-22 DIAGNOSIS — K635 Polyp of colon: Secondary | ICD-10-CM | POA: Diagnosis not present

## 2018-03-22 DIAGNOSIS — Z1211 Encounter for screening for malignant neoplasm of colon: Secondary | ICD-10-CM | POA: Diagnosis not present

## 2018-03-22 DIAGNOSIS — K228 Other specified diseases of esophagus: Secondary | ICD-10-CM | POA: Diagnosis not present

## 2018-03-22 DIAGNOSIS — K317 Polyp of stomach and duodenum: Secondary | ICD-10-CM | POA: Diagnosis not present

## 2018-03-22 DIAGNOSIS — K2930 Chronic superficial gastritis without bleeding: Secondary | ICD-10-CM | POA: Diagnosis not present

## 2018-03-26 DIAGNOSIS — K210 Gastro-esophageal reflux disease with esophagitis: Secondary | ICD-10-CM | POA: Diagnosis not present

## 2018-03-26 DIAGNOSIS — K2930 Chronic superficial gastritis without bleeding: Secondary | ICD-10-CM | POA: Diagnosis not present

## 2018-03-26 DIAGNOSIS — K635 Polyp of colon: Secondary | ICD-10-CM | POA: Diagnosis not present

## 2018-03-26 DIAGNOSIS — K2950 Unspecified chronic gastritis without bleeding: Secondary | ICD-10-CM | POA: Diagnosis not present

## 2018-03-26 DIAGNOSIS — K317 Polyp of stomach and duodenum: Secondary | ICD-10-CM | POA: Diagnosis not present

## 2018-08-18 DIAGNOSIS — D225 Melanocytic nevi of trunk: Secondary | ICD-10-CM | POA: Diagnosis not present

## 2018-08-18 DIAGNOSIS — D2261 Melanocytic nevi of right upper limb, including shoulder: Secondary | ICD-10-CM | POA: Diagnosis not present

## 2018-08-18 DIAGNOSIS — L905 Scar conditions and fibrosis of skin: Secondary | ICD-10-CM | POA: Diagnosis not present

## 2018-08-18 DIAGNOSIS — L988 Other specified disorders of the skin and subcutaneous tissue: Secondary | ICD-10-CM | POA: Diagnosis not present

## 2019-01-04 ENCOUNTER — Ambulatory Visit (INDEPENDENT_AMBULATORY_CARE_PROVIDER_SITE_OTHER): Payer: Medicare Other | Admitting: Internal Medicine

## 2019-01-04 ENCOUNTER — Other Ambulatory Visit: Payer: Self-pay

## 2019-01-04 ENCOUNTER — Encounter: Payer: Self-pay | Admitting: Internal Medicine

## 2019-01-04 VITALS — BP 124/60 | HR 95 | Temp 98.2°F | Ht 63.0 in | Wt 170.2 lb

## 2019-01-04 DIAGNOSIS — N941 Unspecified dyspareunia: Secondary | ICD-10-CM | POA: Insufficient documentation

## 2019-01-04 DIAGNOSIS — R000 Tachycardia, unspecified: Secondary | ICD-10-CM | POA: Diagnosis not present

## 2019-01-04 NOTE — Patient Instructions (Signed)
Medication Instructions:  Your physician recommends that you continue on your current medications as directed. Please refer to the Current Medication list given to you today.  If you need a refill on your cardiac medications before your next appointment, please call your pharmacy.   Testing/Procedures: Your provider has ordered a ZIO monitor. You will wear this for 14 days.   1. Avoid showering during the first 24 hours of wearing the monitor. 2. Avoid excessive sweating to help maximize wear time. 3. Do not submerge the device, no hot tubs, and no swimming pools. 4. Keep any lotions or oils away from the patch. 5. After 24 hours you may shower with the patch on. Take brief showers with your back facing the shower head.  6. Do not remove patch once it has been placed because that will interrupt data and decrease adhesive wear time. 7. Push the button when you have any symptoms and write down what you were feeling. 8. Once you have completed wearing your monitor, remove and place into box which has postage paid and place in your outgoing mailbox.  9. If for some reason you have misplaced your box then call our office and we can provide another box and/or mail it off for you.   Follow-Up: At [facility], you and your health needs are our priority.  As part of our continuing mission to provide you with exceptional heart care, we have created designated Provider Care Teams.  These Care Teams include your primary Cardiologist (physician) and Advanced Practice Providers (APPs -  Physician Assistants and Nurse Practitioners) who all work together to provide you with the care you need, when you need it. You will need a follow up appointment in as needed. You may see Dr. Debara Pickett or one of the following Advanced Practice Providers on your designated Care Team: Almyra Deforest, [place] . Fabian Sharp, PA-C

## 2019-01-05 ENCOUNTER — Encounter: Payer: Self-pay | Admitting: Internal Medicine

## 2019-01-05 NOTE — Progress Notes (Signed)
OFFICE NOTE  Chief Complaint:  Establish cardiologist, tachycardia  Primary Care Physician: Sharilyn Sites, MD  HPI:  Mindy Ellis is a 68 y.o. female with a past medial history significant for diet-controlled diabetes, fibromyalgia, hypertension and acid reflux, who presented for cardiovascular evaluation.  Recently she has noted some tachycardia and symptoms concerning for possible palpitations.  She has been quite anxious which is possibly also contributing to this.  Her husband is also a patient of mine.  She reports that palpitations can occur at rest and symptoms can also occur with exertion, change in position or walking upstairs.  She feels like her heart rate accelerates but feels like it is fairly regular.  She has not been able to monitor this adequately.  She denies any chest pain associated with it or significant shortness of breath but feels uneasy with the fast heart rate.  PMHx:  Past Medical History:  Diagnosis Date  . Abdominal pain   . Acid reflux   . Chronic diarrhea   . DDD (degenerative disc disease)   . Diabetes mellitus without complication (HCC)    diet controlled  . Dysphagia   . Fibromyalgia   . GERD (gastroesophageal reflux disease)   . Hypertension   . Meningioma ([place])   . PONV (postoperative nausea and vomiting)    nausea, pt must not vomit due to reflux surgery    Past Surgical History:  Procedure Laterality Date  . ABDOMINAL HYSTERECTOMY  age 70  . ANTERIOR AND POSTERIOR REPAIR N/A 01/18/2013   Procedure: ANTERIOR (CYSTOCELE) ;  Surgeon: Reece Packer, MD;  Location: [facility];  Service: Urology;  Laterality: N/A;  . APPENDECTOMY  age 72   both ovaries removed  . BREAST SURGERY Left 21 yrs ago   benign lump removed  . CHOLECYSTECTOMY    . COLONOSCOPY  11/11/07   NUR  . CYSTOSCOPY N/A 01/18/2013   Procedure: CYSTOSCOPY;  Surgeon: Reece Packer, MD;  Location: [facility];  Service: Urology;  Laterality: N/A;  . EYE SURGERY    . lasix  eye surgery Bilateral   . PROCTOSCOPY  01/18/2013   Procedure: PROCTOSCOPY;  Surgeon: Reece Packer, MD;  Location: [facility];  Service: Urology;;  . surgery  for acid reflux  1999  . TONSILLECTOMY    . UPPER GASTROINTESTINAL ENDOSCOPY  2006    FAMHx:  Family History  Problem Relation Age of Onset  . Drug abuse Other   . Depression Mother   . Asthma Mother   . COPD Mother   . Anxiety disorder Maternal Grandmother   . Depression Maternal Grandmother   . COPD Sister     SOCHx:   reports that she has never smoked. She has never used smokeless tobacco. She reports current alcohol use. She reports that she does not use drugs.  ALLERGIES:  Allergies  Allergen Reactions  . Fish-Derived Products Other (See Comments)  . Mirtazapine Nausea Only  . Naproxen Sodium     Upsets stomach  . Other     Torcan=caused signs of stroke,  vioxx=headache  . Statins Other (See Comments)    Muscle aches  . Zolpidem Tartrate Other (See Comments)    Ambien= confusion  . Erythromycin Rash  . Penicillins Rash  . Sulfur Rash  . Tetracyclines & Related Rash    ROS: Pertinent items noted in HPI and remainder of comprehensive ROS otherwise negative.  HOME MEDS: Current Outpatient Medications on File Prior to Visit  Medication Sig Dispense  Refill  . acetaminophen (TYLENOL) 500 MG tablet Take 500 mg by mouth every 6 (six) hours as needed for pain.    . Calcium Carbonate-Vitamin D (CALCIUM + D PO) Take 600 mg by mouth daily.    Marland Kitchen diltiazem (DILACOR XR) 180 MG 24 hr capsule Take 180 mg by mouth daily.    Marland Kitchen MAGNESIUM PO Take by mouth.    . Probiotic Product (ALIGN) 4 MG CAPS Take 4 mg by mouth daily.    . traZODone (DESYREL) 100 MG tablet Take 1 tablet (100 mg total) by mouth at bedtime. 30 tablet 2  . loratadine (CLARITIN) 10 MG tablet Take 10 mg by mouth every morning.     . [DISCONTINUED] fexofenadine (ALLEGRA) 180 MG tablet Take by mouth daily.      . [DISCONTINUED] mirtazapine (REMERON) 15 MG  tablet Take 15 mg by mouth at bedtime.      . [DISCONTINUED] omeprazole (PRILOSEC) 20 MG capsule Take 20 mg by mouth daily.      . [DISCONTINUED] promethazine (PHENERGAN) 25 MG tablet Take 25 mg by mouth every 6 (six) hours as needed.       No current facility-administered medications on file prior to visit.     LABS/IMAGING: No results found for this or any previous visit (from the past 48 hour(s)). No results found.  LIPID PANEL: No results found for: CHOL, TRIG, HDL, CHOLHDL, VLDL, LDLCALC, LDLDIRECT   WEIGHTS: Wt Readings from Last 3 Encounters:  01/04/19 170 lb 4 oz (77.2 kg)  12/13/13 174 lb 8 oz (79.2 kg)  07/07/13 177 lb 9.6 oz (80.6 kg)    VITALS: BP 124/60 (BP Location: Right Arm, Patient Position: Sitting, Cuff Size: Normal)   Pulse 95   Temp 98.2 F (36.8 C)   Ht 5\' 3"  (1.6 m)   Wt 170 lb 4 oz (77.2 kg)   BMI 30.16 kg/m   EXAM: General appearance: alert and no distress Neck: no carotid bruit, no JVD and thyroid not enlarged, symmetric, no tenderness/mass/nodules Lungs: clear to auscultation bilaterally Heart: regular rate and rhythm, S1, S2 normal, no murmur, click, rub or gallop Abdomen: soft, non-tender; bowel sounds normal; no masses,  no organomegaly Extremities: extremities normal, atraumatic, no cyanosis or edema Pulses: 2+ and symmetric Skin: Skin color, texture, turgor normal. No rashes or lesions Neurologic: Grossly normal Psych: Pleasant  EKG: Normal sinus rhythm with low voltage QRS at 95- personally reviewed  ASSESSMENT: 1. Tachycardia 2. Hypertension  PLAN: 1.   [name] is noted recent tachycardia which is somewhat inappropriate.  It could be a little worse with exertion and sometimes is elevated at rest.  She denies any pain or worsening shortness of breath with this.  Her EKG shows a sinus rhythm in the upper 90s with low voltage QRS.  I would like to place a 2-week monitor to see if she is having any evidence for arrhythmias that she  notes sometimes the symptoms are paroxysmal.  Her PCP already started her on some diltiazem which is taken for 2 days with perhaps some improvement in heart rate.  I encouraged her to continue on that.  We will plan follow-up virtual visit to discuss the monitor findings.  Thanks again for the kind referral.  Pixie Casino, MD, FACC, [place] Director of the [facility] Diplomate of the American Board of Clinical Lipidology Attending Cardiologist  Direct Dial: [phone]  Fax:  [phone]  Website:  [email or web address]  01/05/2019, 10:49 AM

## 2019-01-06 ENCOUNTER — Telehealth: Payer: Self-pay | Admitting: *Deleted

## 2019-01-06 NOTE — Telephone Encounter (Signed)
14 day ZIO XT long term holter monitor to be mailed to the patients home.  Instructions reviewed briefly as they are included in the monitor kit. 

## 2019-01-11 ENCOUNTER — Ambulatory Visit (INDEPENDENT_AMBULATORY_CARE_PROVIDER_SITE_OTHER): Payer: Medicare Other

## 2019-01-11 DIAGNOSIS — R000 Tachycardia, unspecified: Secondary | ICD-10-CM | POA: Diagnosis not present

## 2019-02-14 ENCOUNTER — Other Ambulatory Visit (HOSPITAL_COMMUNITY): Payer: Self-pay | Admitting: Family Medicine

## 2019-02-14 DIAGNOSIS — Z6829 Body mass index (BMI) 29.0-29.9, adult: Secondary | ICD-10-CM | POA: Insufficient documentation

## 2019-02-14 DIAGNOSIS — D329 Benign neoplasm of meninges, unspecified: Secondary | ICD-10-CM | POA: Insufficient documentation

## 2019-02-14 DIAGNOSIS — Z1231 Encounter for screening mammogram for malignant neoplasm of breast: Secondary | ICD-10-CM

## 2019-03-21 ENCOUNTER — Other Ambulatory Visit: Payer: Self-pay

## 2019-03-21 ENCOUNTER — Ambulatory Visit (HOSPITAL_COMMUNITY)
Admission: RE | Admit: 2019-03-21 | Discharge: 2019-03-21 | Disposition: A | Discharge status: Home / Self Care | Payer: Medicare Other | Source: Ambulatory Visit | Attending: Family Medicine | Admitting: Family Medicine

## 2019-03-21 DIAGNOSIS — Z1231 Encounter for screening mammogram for malignant neoplasm of breast: Secondary | ICD-10-CM | POA: Diagnosis not present

## 2019-05-17 DIAGNOSIS — Z0001 Encounter for general adult medical examination with abnormal findings: Secondary | ICD-10-CM | POA: Diagnosis not present

## 2019-05-17 DIAGNOSIS — E876 Hypokalemia: Secondary | ICD-10-CM | POA: Diagnosis not present

## 2019-05-17 DIAGNOSIS — E559 Vitamin D deficiency, unspecified: Secondary | ICD-10-CM | POA: Diagnosis not present

## 2019-05-17 DIAGNOSIS — E119 Type 2 diabetes mellitus without complications: Secondary | ICD-10-CM | POA: Diagnosis not present

## 2019-05-17 DIAGNOSIS — N390 Urinary tract infection, site not specified: Secondary | ICD-10-CM | POA: Diagnosis not present

## 2019-05-17 DIAGNOSIS — M797 Fibromyalgia: Secondary | ICD-10-CM | POA: Diagnosis not present

## 2019-05-17 DIAGNOSIS — Z1389 Encounter for screening for other disorder: Secondary | ICD-10-CM | POA: Diagnosis not present

## 2019-05-17 DIAGNOSIS — E663 Overweight: Secondary | ICD-10-CM | POA: Diagnosis not present

## 2019-05-17 DIAGNOSIS — G64 Other disorders of peripheral nervous system: Secondary | ICD-10-CM | POA: Diagnosis not present

## 2019-05-17 DIAGNOSIS — Z6829 Body mass index (BMI) 29.0-29.9, adult: Secondary | ICD-10-CM | POA: Diagnosis not present

## 2019-05-17 DIAGNOSIS — I10 Essential (primary) hypertension: Secondary | ICD-10-CM | POA: Diagnosis not present

## 2019-08-15 DIAGNOSIS — L814 Other melanin hyperpigmentation: Secondary | ICD-10-CM | POA: Diagnosis not present

## 2019-08-15 DIAGNOSIS — D485 Neoplasm of uncertain behavior of skin: Secondary | ICD-10-CM | POA: Diagnosis not present

## 2019-08-15 DIAGNOSIS — L821 Other seborrheic keratosis: Secondary | ICD-10-CM | POA: Diagnosis not present

## 2019-08-15 DIAGNOSIS — D225 Melanocytic nevi of trunk: Secondary | ICD-10-CM | POA: Diagnosis not present

## 2019-08-15 DIAGNOSIS — I781 Nevus, non-neoplastic: Secondary | ICD-10-CM | POA: Diagnosis not present

## 2019-08-15 DIAGNOSIS — L570 Actinic keratosis: Secondary | ICD-10-CM | POA: Diagnosis not present

## 2019-09-28 DIAGNOSIS — E119 Type 2 diabetes mellitus without complications: Secondary | ICD-10-CM | POA: Diagnosis not present

## 2019-10-05 DIAGNOSIS — E6609 Other obesity due to excess calories: Secondary | ICD-10-CM | POA: Diagnosis not present

## 2019-10-05 DIAGNOSIS — I10 Essential (primary) hypertension: Secondary | ICD-10-CM | POA: Diagnosis not present

## 2019-10-05 DIAGNOSIS — M797 Fibromyalgia: Secondary | ICD-10-CM | POA: Diagnosis not present

## 2019-10-05 DIAGNOSIS — E119 Type 2 diabetes mellitus without complications: Secondary | ICD-10-CM | POA: Diagnosis not present

## 2019-10-05 DIAGNOSIS — Z6830 Body mass index (BMI) 30.0-30.9, adult: Secondary | ICD-10-CM | POA: Diagnosis not present

## 2019-10-05 DIAGNOSIS — G64 Other disorders of peripheral nervous system: Secondary | ICD-10-CM | POA: Diagnosis not present

## 2019-10-25 DIAGNOSIS — E6609 Other obesity due to excess calories: Secondary | ICD-10-CM | POA: Diagnosis not present

## 2019-10-25 DIAGNOSIS — Z6830 Body mass index (BMI) 30.0-30.9, adult: Secondary | ICD-10-CM | POA: Diagnosis not present

## 2019-10-25 DIAGNOSIS — E049 Nontoxic goiter, unspecified: Secondary | ICD-10-CM | POA: Diagnosis not present

## 2019-10-25 DIAGNOSIS — N342 Other urethritis: Secondary | ICD-10-CM | POA: Diagnosis not present

## 2019-10-26 ENCOUNTER — Other Ambulatory Visit (HOSPITAL_COMMUNITY): Payer: Self-pay | Admitting: Family Medicine

## 2019-10-26 ENCOUNTER — Other Ambulatory Visit: Payer: Self-pay | Admitting: Family Medicine

## 2019-10-26 DIAGNOSIS — E049 Nontoxic goiter, unspecified: Secondary | ICD-10-CM

## 2019-11-01 ENCOUNTER — Other Ambulatory Visit: Payer: Self-pay

## 2019-11-01 ENCOUNTER — Encounter (HOSPITAL_COMMUNITY): Payer: Self-pay | Admitting: Emergency Medicine

## 2019-11-01 ENCOUNTER — Emergency Department (HOSPITAL_COMMUNITY): Payer: Medicare PPO

## 2019-11-01 ENCOUNTER — Ambulatory Visit (HOSPITAL_COMMUNITY)
Admission: RE | Admit: 2019-11-01 | Discharge: 2019-11-01 | Disposition: A | Discharge status: Home / Self Care | Payer: Medicare PPO | Source: Ambulatory Visit | Attending: Family Medicine | Admitting: Family Medicine

## 2019-11-01 ENCOUNTER — Emergency Department (HOSPITAL_COMMUNITY)
Admission: EM | Admit: 2019-11-01 | Discharge: 2019-11-02 | Disposition: A | Discharge status: Home / Self Care | Payer: Medicare PPO | Attending: Emergency Medicine | Admitting: Emergency Medicine

## 2019-11-01 DIAGNOSIS — E042 Nontoxic multinodular goiter: Secondary | ICD-10-CM | POA: Diagnosis not present

## 2019-11-01 DIAGNOSIS — N2889 Other specified disorders of kidney and ureter: Secondary | ICD-10-CM | POA: Diagnosis not present

## 2019-11-01 DIAGNOSIS — E049 Nontoxic goiter, unspecified: Secondary | ICD-10-CM

## 2019-11-01 DIAGNOSIS — I10 Essential (primary) hypertension: Secondary | ICD-10-CM | POA: Diagnosis not present

## 2019-11-01 DIAGNOSIS — E119 Type 2 diabetes mellitus without complications: Secondary | ICD-10-CM | POA: Insufficient documentation

## 2019-11-01 DIAGNOSIS — R079 Chest pain, unspecified: Secondary | ICD-10-CM

## 2019-11-01 LAB — BASIC METABOLIC PANEL
Anion gap: 10 (ref 5–15)
BUN: 20 mg/dL (ref 8–23)
CO2: 25 mmol/L (ref 22–32)
Calcium: 9.2 mg/dL (ref 8.9–10.3)
Chloride: 101 mmol/L (ref 98–111)
Creatinine, Ser: 1.1 mg/dL — ABNORMAL HIGH (ref 0.44–1.00)
GFR calc Af Amer: 59 mL/min — ABNORMAL LOW (ref >60)
GFR calc non Af Amer: 51 mL/min — ABNORMAL LOW (ref >60)
Glucose, Bld: 178 mg/dL — ABNORMAL HIGH (ref 70–99)
Potassium: 3.8 mmol/L (ref 3.5–5.1)
Sodium: 136 mmol/L (ref 135–145)

## 2019-11-01 LAB — TROPONIN I (HIGH SENSITIVITY)
Troponin I (High Sensitivity): 2 ng/L (ref <18)
Troponin I (High Sensitivity): <2 ng/L (ref <18)

## 2019-11-01 LAB — CBC
HCT: 41.5 % (ref 36.0–46.0)
Hemoglobin: 13.1 g/dL (ref 12.0–15.0)
MCH: 28.5 pg (ref 26.0–34.0)
MCHC: 31.6 g/dL (ref 30.0–36.0)
MCV: 90.4 fL (ref 80.0–100.0)
Platelets: 342 10*3/uL (ref 150–400)
RBC: 4.59 MIL/uL (ref 3.87–5.11)
RDW: 13.6 % (ref 11.5–15.5)
WBC: 9.8 10*3/uL (ref 4.0–10.5)
nRBC: 0 % (ref 0.0–0.2)

## 2019-11-01 NOTE — ED Triage Notes (Addendum)
Pt arrived to ED during Epic downtime. The following pt information entered from downtime form as soon as Epic came back online. Triage completed on paper form starting at 1846.  Pt reports chest pain sudden onset approximately 45 minutes pta. Pt denies pain/shortness of breath at this time but reports pressure. EKG completed at 1845.

## 2019-11-01 NOTE — ED Provider Notes (Signed)
[facility] EMERGENCY DEPARTMENT Provider Note   CSN: [id number] Arrival date & time: 11/01/19  1850   Time seen 11:40 PM  History Chief Complaint  Patient presents with  . Chest Pain    Mindy Ellis is a 69 y.o. female.  HPI   Patient states tonight about 6:15 PM she was sitting in a chair and had acute onset of severe pain between her shoulder blades that radiated into her anterior chest.  She states it felt like someone was sitting on her chest.  She states it lasted about 30 to 45 minutes and has been gone now.  It made her feel short of breath but she denies diaphoresis, nausea, or vomiting.  She states she is never had it before.  She denies feeling a tearing sensation.  She denies any prior heart problems other than having mitral valve prolapse.  Patient states she had 1 of those wellness ultrasound test done about 2 weeks ago and today around 115 she had a Doppler ultrasound done of her carotids and they found a thyroid nodule.  She states she was not concerned or upset about it.  Patient has had a esophageal wrap due to severe GERD and she denies any burning fluid in her throat today.  She denies any family history of coronary artery disease.  Her risk factors she denies smoking, she does have diet-controlled diabetes and has on medications for hypertension.  She is also in a study without medications watching her high cholesterol and triglycerides.  PCP Sharilyn Sites, MD   Past Medical History:  Diagnosis Date  . Abdominal pain   . Acid reflux   . Chronic diarrhea   . DDD (degenerative disc disease)   . Diabetes mellitus without complication (HCC)    diet controlled  . Dysphagia   . Fibromyalgia   . GERD (gastroesophageal reflux disease)   . Hypertension   . Meningioma ([place])   . PONV (postoperative nausea and vomiting)    nausea, pt must not vomit due to reflux surgery    Patient Active Problem List   Diagnosis Date Noted  . Dyspareunia in female 01/04/2019  .  Unspecified hereditary and idiopathic peripheral neuropathy 07/07/2013  . Memory changes 07/07/2013  . Type II or unspecified type diabetes mellitus without mention of complication, not stated as uncontrolled 07/07/2013  . Unspecified essential hypertension 07/07/2013    Past Surgical History:  Procedure Laterality Date  . ABDOMINAL HYSTERECTOMY  age 36  . ANTERIOR AND POSTERIOR REPAIR N/A 01/18/2013   Procedure: ANTERIOR (CYSTOCELE) ;  Surgeon: Reece Packer, MD;  Location: [facility];  Service: Urology;  Laterality: N/A;  . APPENDECTOMY  age 25   both ovaries removed  . BREAST SURGERY Left 21 yrs ago   benign lump removed  . CHOLECYSTECTOMY    . COLONOSCOPY  11/11/07   NUR  . CYSTOSCOPY N/A 01/18/2013   Procedure: CYSTOSCOPY;  Surgeon: Reece Packer, MD;  Location: [facility];  Service: Urology;  Laterality: N/A;  . EYE SURGERY    . lasix eye surgery Bilateral   . PROCTOSCOPY  01/18/2013   Procedure: PROCTOSCOPY;  Surgeon: Reece Packer, MD;  Location: [facility];  Service: Urology;;  . surgery  for acid reflux  1999  . TONSILLECTOMY    . UPPER GASTROINTESTINAL ENDOSCOPY  2006     OB History   No obstetric history on file.     Family History  Problem Relation Age of Onset  . Drug  abuse Other   . Depression Mother   . Asthma Mother   . COPD Mother   . Anxiety disorder Maternal Grandmother   . Depression Maternal Grandmother   . COPD Sister     Social History   Tobacco Use  . Smoking status: Never Smoker  . Smokeless tobacco: Never Used  Substance Use Topics  . Alcohol use: Yes    Comment: occ wine  . Drug use: No  Lives at home Lives with spouse  Home Medications Prior to Admission medications   Medication Sig Start Date End Date Taking? Authorizing Provider  acetaminophen (TYLENOL) 500 MG tablet Take 500 mg by mouth every 6 (six) hours as needed for pain.    [provider]  Calcium Carbonate-Vitamin D (CALCIUM + D PO) Take 600 mg by mouth  daily.    [provider]  diltiazem (DILACOR XR) 180 MG 24 hr capsule Take 180 mg by mouth daily.    [provider]  loratadine (CLARITIN) 10 MG tablet Take 10 mg by mouth every morning.     [provider]  MAGNESIUM PO Take by mouth.    [provider]  nitroGLYCERIN (NITROSTAT) 0.4 MG SL tablet Place 1 tablet (0.4 mg total) under the tongue every 5 (five) minutes as needed for chest pain. 11/02/19   Rolland Porter, MD  Probiotic Product (ALIGN) 4 MG CAPS Take 4 mg by mouth daily.    [provider]  traZODone (DESYREL) 100 MG tablet Take 1 tablet (100 mg total) by mouth at bedtime. 01/02/14   Cloria Spring, MD  fexofenadine (ALLEGRA) 180 MG tablet Take by mouth daily.    06/22/11  [provider]  mirtazapine (REMERON) 15 MG tablet Take 15 mg by mouth at bedtime.    06/22/11  [provider]  omeprazole (PRILOSEC) 20 MG capsule Take 20 mg by mouth daily.    06/22/11  [provider]  promethazine (PHENERGAN) 25 MG tablet Take 25 mg by mouth every 6 (six) hours as needed.    06/22/11  [provider]    Allergies    Fish-derived products, Mirtazapine, Naproxen sodium, Other, Statins, Zolpidem tartrate, Erythromycin, Penicillins, Sulfur, and Tetracyclines & related  Review of Systems   Review of Systems  All other systems reviewed and are negative.   Physical Exam Updated Vital Signs BP (!) 145/78   Pulse 79   Temp 98.4 F (36.9 C) (Oral)   Resp 16   Ht 5\' 3"  (1.6 m)   Wt 79.4 kg   SpO2 100%   BMI 31.00 kg/m   Physical Exam Vitals and nursing note reviewed.  Constitutional:      General: She is not in acute distress.    Appearance: Normal appearance. She is obese.  HENT:     Head: Normocephalic and atraumatic.     Right Ear: External ear normal.     Left Ear: External ear normal.     Nose: Nose normal.     Mouth/Throat:     Mouth: Mucous membranes are moist.     Pharynx: No oropharyngeal exudate  or posterior oropharyngeal erythema.  Eyes:     Extraocular Movements: Extraocular movements intact.     Conjunctiva/sclera: Conjunctivae normal.     Pupils: Pupils are equal, round, and reactive to light.  Cardiovascular:     Rate and Rhythm: Normal rate and regular rhythm.     Pulses: Normal pulses.  Pulmonary:     Effort: Pulmonary effort  is normal. No respiratory distress.     Breath sounds: Normal breath sounds.  Chest:       Comments: Area of pain noted Musculoskeletal:        General: Normal range of motion.     Cervical back: Normal range of motion.       Back:     Comments: Area of pain noted  Skin:    General: Skin is warm and dry.  Neurological:     General: No focal deficit present.     Mental Status: She is alert and oriented to person, place, and time.     Cranial Nerves: No cranial nerve deficit.  Psychiatric:        Mood and Affect: Mood normal.        Behavior: Behavior normal.        Thought Content: Thought content normal.     ED Results / Procedures / Treatments   Labs (all labs ordered are listed, but only abnormal results are displayed) Results for orders placed or performed during the hospital encounter of 69/67/89  Basic metabolic panel  Result Value Ref Range   Sodium 136 135 - 145 mmol/L   Potassium 3.8 3.5 - 5.1 mmol/L   Chloride 101 98 - 111 mmol/L   CO2 25 22 - 32 mmol/L   Glucose, Bld 178 (H) 70 - 99 mg/dL   BUN 20 8 - 23 mg/dL   Creatinine, Ser 1.10 (H) 0.44 - 1.00 mg/dL   Calcium 9.2 8.9 - 10.3 mg/dL   GFR calc non Af Amer 51 (L) >60 mL/min   GFR calc Af Amer 59 (L) >60 mL/min   Anion gap 10 5 - 15  CBC  Result Value Ref Range   WBC 9.8 4.0 - 10.5 K/uL   RBC 4.59 3.87 - 5.11 MIL/uL   Hemoglobin 13.1 12.0 - 15.0 g/dL   HCT 41.5 36 - 46 %   MCV 90.4 80.0 - 100.0 fL   MCH 28.5 26.0 - 34.0 pg   MCHC 31.6 30.0 - 36.0 g/dL   RDW 13.6 11.5 - 15.5 %   Platelets 342 150 - 400 K/uL   nRBC 0.0 0.0 - 0.2 %  Troponin I (High  Sensitivity)  Result Value Ref Range   Troponin I (High Sensitivity) 2 <18 ng/L  Troponin I (High Sensitivity)  Result Value Ref Range   Troponin I (High Sensitivity) <2 <18 ng/L   Laboratory interpretation all normal except mild renal insufficiency    EKG None  Radiology DG Chest 2 View  Result Date: 11/01/2019 CLINICAL DATA:  Chest pain EXAM: CHEST - 2 VIEW COMPARISON:  01/18/2013 FINDINGS: Cardiac shadow is within normal limits. The lungs are well aerated bilaterally. No focal infiltrate or sizable effusion is noted. Minimal scarring in the left base is noted. No bony abnormality is seen. IMPRESSION: No active cardiopulmonary disease. Electronically Signed   By: Inez Catalina M.D.   On: 11/01/2019 19:43    Procedures Procedures (including critical care time)  Medications Ordered in ED Medications  iohexol (OMNIPAQUE) 350 MG/ML injection 100 mL (100 mLs Intravenous Contrast Given 11/02/19 0037)    ED Course  I have reviewed the triage vital signs and the nursing notes.  Pertinent labs & imaging results that were available during my care of the patient were reviewed by me and considered in my medical decision making (see chart for details).    MDM Rules/Calculators/A&P  Review of imaging study shows she had a ultrasound of her thyroid done today however it has not been read by the radiologist yet.  Due to the way she described the pain is started into her back and radiating into her chest CTA looking for dissection was done.  1:30 AM patient was given the results of her CTA.  She states her husband sees Dr. Debara Pickett, cardiologist in [place].  She states she could follow-up with him where she does not mind seeing a cardiologist up here.  She will be given both numbers.  For like she should have a cardiology evaluation.  I am going to give her nitroglycerin to use sublingually in case she has the chest pain again but she should also proceed to the ED if  that happens.  Final Clinical Impression(s) / ED Diagnoses Final diagnoses:  Chest pain, unspecified type    Rx / DC Orders ED Discharge Orders         Ordered    nitroGLYCERIN (NITROSTAT) 0.4 MG SL tablet  Every 5 min PRN     Discontinue  Reprint     11/02/19 0139          Plan discharge  Rolland Porter, MD, Barbette Or, MD 11/02/19 0140

## 2019-11-02 ENCOUNTER — Emergency Department (HOSPITAL_COMMUNITY): Payer: Medicare PPO

## 2019-11-02 DIAGNOSIS — N2889 Other specified disorders of kidney and ureter: Secondary | ICD-10-CM | POA: Diagnosis not present

## 2019-11-02 DIAGNOSIS — R079 Chest pain, unspecified: Secondary | ICD-10-CM | POA: Diagnosis not present

## 2019-11-02 MED ORDER — NITROGLYCERIN 0.4 MG SL SUBL
0.4000 mg | SUBLINGUAL_TABLET | SUBLINGUAL | 0 refills | Status: DC | PRN
Start: 2019-11-02 — End: 2022-12-30

## 2019-11-02 MED ORDER — IOHEXOL 350 MG/ML SOLN
100.0000 mL | Freq: Once | INTRAVENOUS | Status: AC | PRN
  Administered 2019-11-02: 100 mL via INTRAVENOUS

## 2019-11-02 NOTE — Discharge Instructions (Addendum)
Please drink plenty of fluids tonight and tomorrow because you had the IV contrast dye today.  Use the nitroglycerin if you get the chest pain again, however you should proceed to the emergency department after taking it.  I would recommend you see a cardiologist, you could either see your husband's cardiologist, Dr. Debara Pickett or see a cardiologist that is here in [place].  I gave you both phone numbers.

## 2019-11-28 DIAGNOSIS — D485 Neoplasm of uncertain behavior of skin: Secondary | ICD-10-CM | POA: Diagnosis not present

## 2019-11-28 DIAGNOSIS — D2261 Melanocytic nevi of right upper limb, including shoulder: Secondary | ICD-10-CM | POA: Diagnosis not present

## 2019-11-28 DIAGNOSIS — D225 Melanocytic nevi of trunk: Secondary | ICD-10-CM | POA: Diagnosis not present

## 2019-12-08 DIAGNOSIS — L988 Other specified disorders of the skin and subcutaneous tissue: Secondary | ICD-10-CM | POA: Diagnosis not present

## 2019-12-08 DIAGNOSIS — D485 Neoplasm of uncertain behavior of skin: Secondary | ICD-10-CM | POA: Diagnosis not present

## 2019-12-09 DIAGNOSIS — M545 Low back pain: Secondary | ICD-10-CM | POA: Diagnosis not present

## 2019-12-09 DIAGNOSIS — M5136 Other intervertebral disc degeneration, lumbar region: Secondary | ICD-10-CM | POA: Diagnosis not present

## 2019-12-09 DIAGNOSIS — M25552 Pain in left hip: Secondary | ICD-10-CM | POA: Diagnosis not present

## 2019-12-16 DIAGNOSIS — M545 Low back pain: Secondary | ICD-10-CM | POA: Diagnosis not present

## 2019-12-16 DIAGNOSIS — M25552 Pain in left hip: Secondary | ICD-10-CM | POA: Diagnosis not present

## 2019-12-22 DIAGNOSIS — D485 Neoplasm of uncertain behavior of skin: Secondary | ICD-10-CM | POA: Diagnosis not present

## 2019-12-22 DIAGNOSIS — L814 Other melanin hyperpigmentation: Secondary | ICD-10-CM | POA: Diagnosis not present

## 2019-12-22 DIAGNOSIS — D1801 Hemangioma of skin and subcutaneous tissue: Secondary | ICD-10-CM | POA: Diagnosis not present

## 2019-12-22 DIAGNOSIS — L570 Actinic keratosis: Secondary | ICD-10-CM | POA: Diagnosis not present

## 2019-12-22 DIAGNOSIS — L821 Other seborrheic keratosis: Secondary | ICD-10-CM | POA: Diagnosis not present

## 2019-12-29 DIAGNOSIS — Z6830 Body mass index (BMI) 30.0-30.9, adult: Secondary | ICD-10-CM | POA: Diagnosis not present

## 2019-12-29 DIAGNOSIS — B029 Zoster without complications: Secondary | ICD-10-CM | POA: Diagnosis not present

## 2019-12-29 DIAGNOSIS — M545 Low back pain: Secondary | ICD-10-CM | POA: Diagnosis not present

## 2019-12-29 DIAGNOSIS — E6609 Other obesity due to excess calories: Secondary | ICD-10-CM | POA: Diagnosis not present

## 2020-01-09 DIAGNOSIS — M5432 Sciatica, left side: Secondary | ICD-10-CM | POA: Diagnosis not present

## 2020-01-16 ENCOUNTER — Other Ambulatory Visit (HOSPITAL_COMMUNITY): Payer: Self-pay | Admitting: Orthopedic Surgery

## 2020-01-16 ENCOUNTER — Other Ambulatory Visit: Payer: Self-pay | Admitting: Orthopedic Surgery

## 2020-01-16 DIAGNOSIS — M5451 Vertebrogenic low back pain: Secondary | ICD-10-CM

## 2020-01-23 ENCOUNTER — Other Ambulatory Visit: Payer: Self-pay

## 2020-01-23 DIAGNOSIS — M5136 Other intervertebral disc degeneration, lumbar region: Secondary | ICD-10-CM | POA: Diagnosis not present

## 2020-01-23 DIAGNOSIS — Z6830 Body mass index (BMI) 30.0-30.9, adult: Secondary | ICD-10-CM | POA: Diagnosis not present

## 2020-01-23 DIAGNOSIS — E6609 Other obesity due to excess calories: Secondary | ICD-10-CM | POA: Diagnosis not present

## 2020-01-23 DIAGNOSIS — M5432 Sciatica, left side: Secondary | ICD-10-CM | POA: Diagnosis not present

## 2020-01-23 NOTE — Patient Outreach (Signed)
[facility]) Care Management  01/23/2020  Mindy Ellis January 15, 1951 [id number]   Telephone Screen  Referral Date: 01/23/2020 Referral Source: Nurse Line  Referral Reason: "Pt complaining of back/sciatica pain and asking is can take tramadol, methocarbamol, amitriptyline together.  Pt also took Percocet together and stated ortho MD and PCP had prescribed different pain meds.  Did advise to call her MD in the morning concerning back and leg pain and also asked MD and pharmacist to evaluate her meds.  Advised to only take tramadol tonight; pt stated she had taken Percocet this am and nothing else."  Outreach attempt # 1 to patient. Spoke with patient who reports she is just returning home from visiting MD. She discussed pain mgmt with MD. She was given a steroid injection as well as pain med injection while in the office. She voices that MD has changed her pain regimen and starting her on some new meds and told her to discontinue taking Tramadol. She is hopeful that new regimen will work. She is to follow up with MD. She denies any further RN CM needs or concerns at this time. She is aware that she can call 24 hr Nurse Herbie Saxon for any future needs or concerns.       Plan: RN CM will close case at this time.    Enzo Montgomery, RN,BSN,CCM [place] Management Telephonic Care Management Coordinator Direct Phone: [phone] Toll Free: [phone] Fax: [phone]

## 2020-01-25 ENCOUNTER — Ambulatory Visit (HOSPITAL_COMMUNITY)
Admission: RE | Admit: 2020-01-25 | Discharge: 2020-01-25 | Disposition: A | Discharge status: Home / Self Care | Payer: Medicare PPO | Source: Ambulatory Visit | Attending: Orthopedic Surgery | Admitting: Orthopedic Surgery

## 2020-01-25 ENCOUNTER — Other Ambulatory Visit: Payer: Self-pay

## 2020-01-25 DIAGNOSIS — M5451 Vertebrogenic low back pain: Secondary | ICD-10-CM

## 2020-01-25 DIAGNOSIS — M48061 Spinal stenosis, lumbar region without neurogenic claudication: Secondary | ICD-10-CM | POA: Diagnosis not present

## 2020-01-25 DIAGNOSIS — M5127 Other intervertebral disc displacement, lumbosacral region: Secondary | ICD-10-CM | POA: Diagnosis not present

## 2020-01-30 ENCOUNTER — Other Ambulatory Visit: Payer: Self-pay | Admitting: Orthopedic Surgery

## 2020-01-30 DIAGNOSIS — M5116 Intervertebral disc disorders with radiculopathy, lumbar region: Secondary | ICD-10-CM

## 2020-01-30 DIAGNOSIS — M5451 Vertebrogenic low back pain: Secondary | ICD-10-CM | POA: Diagnosis not present

## 2020-01-31 ENCOUNTER — Ambulatory Visit
Admission: RE | Admit: 2020-01-31 | Discharge: 2020-01-31 | Disposition: A | Discharge status: Home / Self Care | Payer: Medicare PPO | Source: Ambulatory Visit | Attending: Orthopedic Surgery | Admitting: Orthopedic Surgery

## 2020-01-31 ENCOUNTER — Other Ambulatory Visit: Payer: Self-pay

## 2020-01-31 DIAGNOSIS — M5116 Intervertebral disc disorders with radiculopathy, lumbar region: Secondary | ICD-10-CM | POA: Diagnosis not present

## 2020-01-31 MED ORDER — METHYLPREDNISOLONE ACETATE 40 MG/ML INJ SUSP (RADIOLOG
120.0000 mg | Freq: Once | INTRAMUSCULAR | Status: AC
  Administered 2020-01-31: 120 mg via EPIDURAL

## 2020-01-31 MED ORDER — IOPAMIDOL (ISOVUE-M 200) INJECTION 41%
1.0000 mL | Freq: Once | INTRAMUSCULAR | Status: AC
  Administered 2020-01-31: 1 mL via EPIDURAL

## 2020-01-31 NOTE — Discharge Instructions (Signed)

## 2020-02-10 DIAGNOSIS — M545 Low back pain, unspecified: Secondary | ICD-10-CM | POA: Diagnosis not present

## 2020-02-10 DIAGNOSIS — M5116 Intervertebral disc disorders with radiculopathy, lumbar region: Secondary | ICD-10-CM | POA: Diagnosis not present

## 2020-02-21 DIAGNOSIS — K649 Unspecified hemorrhoids: Secondary | ICD-10-CM | POA: Diagnosis not present

## 2020-02-21 DIAGNOSIS — M5126 Other intervertebral disc displacement, lumbar region: Secondary | ICD-10-CM | POA: Diagnosis not present

## 2020-02-21 DIAGNOSIS — E1140 Type 2 diabetes mellitus with diabetic neuropathy, unspecified: Secondary | ICD-10-CM | POA: Diagnosis not present

## 2020-02-21 DIAGNOSIS — Z6829 Body mass index (BMI) 29.0-29.9, adult: Secondary | ICD-10-CM | POA: Diagnosis not present

## 2020-02-21 DIAGNOSIS — E663 Overweight: Secondary | ICD-10-CM | POA: Diagnosis not present

## 2020-03-05 DIAGNOSIS — G8929 Other chronic pain: Secondary | ICD-10-CM | POA: Insufficient documentation

## 2020-03-05 DIAGNOSIS — M5442 Lumbago with sciatica, left side: Secondary | ICD-10-CM | POA: Diagnosis not present

## 2020-03-05 DIAGNOSIS — M5126 Other intervertebral disc displacement, lumbar region: Secondary | ICD-10-CM | POA: Insufficient documentation

## 2020-03-05 DIAGNOSIS — M5416 Radiculopathy, lumbar region: Secondary | ICD-10-CM | POA: Diagnosis not present

## 2020-03-13 DIAGNOSIS — M5416 Radiculopathy, lumbar region: Secondary | ICD-10-CM | POA: Diagnosis not present

## 2020-03-16 ENCOUNTER — Ambulatory Visit (INDEPENDENT_AMBULATORY_CARE_PROVIDER_SITE_OTHER): Payer: Medicare PPO

## 2020-03-16 ENCOUNTER — Other Ambulatory Visit: Payer: Self-pay

## 2020-03-16 ENCOUNTER — Ambulatory Visit
Admission: EM | Admit: 2020-03-16 | Discharge: 2020-03-16 | Disposition: A | Discharge status: Home / Self Care | Payer: Medicare PPO

## 2020-03-16 DIAGNOSIS — M25472 Effusion, left ankle: Secondary | ICD-10-CM | POA: Diagnosis not present

## 2020-03-16 DIAGNOSIS — M7989 Other specified soft tissue disorders: Secondary | ICD-10-CM | POA: Diagnosis not present

## 2020-03-16 DIAGNOSIS — M25572 Pain in left ankle and joints of left foot: Secondary | ICD-10-CM

## 2020-03-16 DIAGNOSIS — S93412A Sprain of calcaneofibular ligament of left ankle, initial encounter: Secondary | ICD-10-CM | POA: Diagnosis not present

## 2020-03-16 MED ORDER — TRAMADOL HCL 50 MG PO TABS: 50.0000 mg | ORAL_TABLET | Freq: Two times a day (BID) | ORAL | 0 refills | Status: DC | PRN

## 2020-03-16 NOTE — ED Provider Notes (Signed)
[facility]    CSN: [id number] Arrival date & time: 03/16/20  1124      History   Chief Complaint Chief Complaint  Patient presents with  . swelling of foot    HPI Mindy Ellis is a 69 y.o. female.   HPI Patient reports she was ambulating going to her dentist office and somehow inverted her right ankle and sustained a fall. She did not injure her head and did not fall down the steps she just landed with her ankle inverted. She was unable to bear weight after the injury however has been progressively worsening swelling involving her right ankle and right foot. She has pain with weightbearing. Patient previously has some chronic degenerative issues involving her medial lateral right foot and right lower leg. She has been followed by specialty for management of this separate unrelated issue. She has not noticed any ecchymosis just severe swelling. She has been taken OTC medication without relief. Past Medical History:  Diagnosis Date  . Abdominal pain   . Acid reflux   . Chronic diarrhea   . DDD (degenerative disc disease)   . Diabetes mellitus without complication (HCC)    diet controlled  . Dysphagia   . Fibromyalgia   . GERD (gastroesophageal reflux disease)   . Hypertension   . Meningioma ([place])   . PONV (postoperative nausea and vomiting)    nausea, pt must not vomit due to reflux surgery    Patient Active Problem List   Diagnosis Date Noted  . Dyspareunia in female 01/04/2019  . Unspecified hereditary and idiopathic peripheral neuropathy 07/07/2013  . Memory changes 07/07/2013  . Type II or unspecified type diabetes mellitus without mention of complication, not stated as uncontrolled 07/07/2013  . Unspecified essential hypertension 07/07/2013    Past Surgical History:  Procedure Laterality Date  . ABDOMINAL HYSTERECTOMY  age 19  . ANTERIOR AND POSTERIOR REPAIR N/A 01/18/2013   Procedure: ANTERIOR (CYSTOCELE) ;  Surgeon: Reece Packer, MD;   Location: [facility];  Service: Urology;  Laterality: N/A;  . APPENDECTOMY  age 25   both ovaries removed  . BREAST SURGERY Left 21 yrs ago   benign lump removed  . CHOLECYSTECTOMY    . COLONOSCOPY  11/11/07   NUR  . CYSTOSCOPY N/A 01/18/2013   Procedure: CYSTOSCOPY;  Surgeon: Reece Packer, MD;  Location: [facility];  Service: Urology;  Laterality: N/A;  . EYE SURGERY    . lasix eye surgery Bilateral   . PROCTOSCOPY  01/18/2013   Procedure: PROCTOSCOPY;  Surgeon: Reece Packer, MD;  Location: [facility];  Service: Urology;;  . surgery  for acid reflux  1999  . TONSILLECTOMY    . UPPER GASTROINTESTINAL ENDOSCOPY  2006    OB History   No obstetric history on file.      Home Medications    Prior to Admission medications   Medication Sig Start Date End Date Taking? Authorizing Provider  acetaminophen (TYLENOL) 500 MG tablet Take 500 mg by mouth every 6 (six) hours as needed for pain.   Yes [provider]  amitriptyline (ELAVIL) 50 MG tablet amitriptyline 50 mg tablet  TAKE 1 TABLET BY MOUTH EVERY DAY AT BEDTIME   Yes [provider]  Calcium Carbonate-Vitamin D (CALCIUM + D PO) Take 600 mg by mouth daily.   Yes [provider]  diltiazem (DILACOR XR) 180 MG 24 hr capsule Take 180 mg by mouth daily.   Yes [provider]  loratadine (CLARITIN) 10 MG tablet Take 10 mg by mouth every morning.    Yes [provider]  MAGNESIUM PO Take by mouth.   Yes [provider]  Probiotic Product (ALIGN) 4 MG CAPS Take 4 mg by mouth daily.   Yes [provider]  traZODone (DESYREL) 100 MG tablet Take 1 tablet (100 mg total) by mouth at bedtime. 01/02/14  Yes Cloria Spring, MD  nitroGLYCERIN (NITROSTAT) 0.4 MG SL tablet Place 1 tablet (0.4 mg total) under the tongue every 5 (five) minutes as needed for chest pain. 11/02/19   Rolland Porter, MD  fexofenadine (ALLEGRA) 180 MG tablet Take by mouth daily.    06/22/11  [provider]   mirtazapine (REMERON) 15 MG tablet Take 15 mg by mouth at bedtime.    06/22/11  [provider]  omeprazole (PRILOSEC) 20 MG capsule Take 20 mg by mouth daily.    06/22/11  [provider]  promethazine (PHENERGAN) 25 MG tablet Take 25 mg by mouth every 6 (six) hours as needed.    06/22/11  [provider]    Family History Family History  Problem Relation Age of Onset  . Drug abuse Other   . Depression Mother   . Asthma Mother   . COPD Mother   . Anxiety disorder Maternal Grandmother   . Depression Maternal Grandmother   . COPD Sister     Social History Social History   Tobacco Use  . Smoking status: Never Smoker  . Smokeless tobacco: Never Used  Substance Use Topics  . Alcohol use: Yes    Comment: occ wine  . Drug use: No     Allergies   Fish-derived products, Mirtazapine, Naproxen sodium, Other, Statins, Zolpidem tartrate, Erythromycin, Penicillins, Sulfur, and Tetracyclines & related   Review of Systems Review of Systems Pertinent negatives listed in HPI  Physical Exam Triage Vital Signs ED Triage Vitals  Enc Vitals Group     BP 03/16/20 1227 (!) 156/93     Pulse Rate 03/16/20 1227 (!) 106     Resp --      Temp 03/16/20 1227 98 F (36.7 C)     Temp Source 03/16/20 1227 Oral     SpO2 03/16/20 1227 93 %     Weight 03/16/20 1224 176 lb (79.8 kg)     Height 03/16/20 1224 5\' 4"  (1.626 m)     Head Circumference --      Peak Flow --      Pain Score 03/16/20 1223 7     Pain Loc --      Pain Edu? --      Excl. in [place]? --    No data found.  Updated Vital Signs BP (!) 156/93 (BP Location: Right Arm)   Pulse (!) 106   Temp 98 F (36.7 C) (Oral)   Ht 5\' 4"  (1.626 m)   Wt 176 lb (79.8 kg)   SpO2 93%   BMI 30.21 kg/m   Visual Acuity Right Eye Distance:   Left Eye Distance:   Bilateral Distance:    Right Eye Near:   Left Eye Near:    Bilateral Near:     Physical Exam Constitutional:      Appearance: She is not  ill-appearing.  Cardiovascular:     Rate and Rhythm: Tachycardia present.  Pulmonary:     Effort: Pulmonary effort is normal.     Breath sounds: Normal breath sounds and air entry.  Musculoskeletal:  Cervical back: Normal range of motion and neck supple.     Right ankle: Swelling present. Decreased range of motion.     Right Achilles Tendon: Tenderness present.     Right foot: Decreased range of motion. Swelling and tenderness present.  Neurological:     Mental Status: She is alert.      UC Treatments / Results  Labs (all labs ordered are listed, but only abnormal results are displayed) Labs Reviewed - No data to display  EKG   Radiology DG Ankle Complete Left  Result Date: 03/16/2020 CLINICAL DATA:  Left ankle pain and swelling.  History of fall. EXAM: LEFT ANKLE COMPLETE - 3+ VIEW COMPARISON:  None. FINDINGS: Left ankle is located without a fracture. Diffuse soft tissue swelling. Normal alignment. Prominent plantar calcaneal spur. IMPRESSION: Soft tissue swelling without acute bone abnormality to left ankle. Electronically Signed   By: Markus Daft M.D.   On: 03/16/2020 13:46    Procedures Procedures (including critical care time)  Medications Ordered in UC Medications - No data to display  Initial Impression / Assessment and Plan / UC Course  I have reviewed the triage vital signs and the nursing notes.  Pertinent labs & imaging results that were available during my care of the patient were reviewed by me and considered in my medical decision making (see chart for details).    Acute ankle sprain involving the right ankle/right foot. No acute fracture. Placed in an ankle lace up shoe will have patient follow-up outpatient with her regular Ortho provider. Patient has been. Her renal functioning therefore prescribed a very short course of tramadol and advised to take with Tylenol as needed for pain. RICE recommended when out of lace up. Continue outpatient management with  specialty and primary care. Final Clinical Impressions(s) / UC Diagnoses   Final diagnoses:  Sprain of calcaneofibular ligament of left ankle, initial encounter     Discharge Instructions     Negative fracture of right ankle. Follow-up with orthopedic specialist for further evaluation if symptoms do not resolve. Elevate and ice. Wear ankle lace-up with ambulation. Tramadol twice daily for pain and Tylenol 500 mg every every 4-6 hours for pain    ED Prescriptions    Medication Sig Dispense Auth. Provider   traMADol (ULTRAM) 50 MG tablet Take 1 tablet (50 mg total) by mouth every 12 (twelve) hours as needed. 12 tablet Scot Jun, FNP     PDMP not reviewed this encounter.   Scot Jun, FNP 03/16/20 1500

## 2020-03-16 NOTE — Discharge Instructions (Addendum)
Negative fracture of right ankle. Follow-up with orthopedic specialist for further evaluation if symptoms do not resolve. Elevate and ice. Wear ankle lace-up with ambulation. Tramadol twice daily for pain and Tylenol 500 mg every every 4-6 hours for pain

## 2020-03-16 NOTE — ED Triage Notes (Signed)
Pt states that she fell and she has some pain in her left leg. Pt's left foot is swollen.

## 2020-03-21 ENCOUNTER — Other Ambulatory Visit: Payer: Self-pay | Admitting: Neurosurgery

## 2020-03-21 DIAGNOSIS — M5126 Other intervertebral disc displacement, lumbar region: Secondary | ICD-10-CM | POA: Diagnosis not present

## 2020-03-21 DIAGNOSIS — M5416 Radiculopathy, lumbar region: Secondary | ICD-10-CM | POA: Diagnosis not present

## 2020-03-21 DIAGNOSIS — Z6830 Body mass index (BMI) 30.0-30.9, adult: Secondary | ICD-10-CM | POA: Diagnosis not present

## 2020-03-21 DIAGNOSIS — I10 Essential (primary) hypertension: Secondary | ICD-10-CM | POA: Diagnosis not present

## 2020-03-21 DIAGNOSIS — M4316 Spondylolisthesis, lumbar region: Secondary | ICD-10-CM | POA: Diagnosis not present

## 2020-03-29 NOTE — Pre-Procedure Instructions (Signed)
[place], [place] - [place] [id number] [place] [address] [place] [address] Phone: [phone] Fax: [phone]      Your procedure is scheduled on Wednesday, December 29th.  Report to [facility]tr[facility] "A" at 12:30 P.M., and check in at the Admitting office.  Call this number if you have problems the morning of surgery:  [phone]  Call [phone] if you have any questions prior to your surgery date Monday-Friday 8am-4pm    Remember:  Do not eat or drink after midnight the night before your surgery   Take these medicines the morning of surgery with A SIP OF WATER  diltiazem (DILACOR XR)  gabapentin (NEURONTIN) acetaminophen (TYLENOL) traMADol (ULTRAM)-as needed  As of today, STOP taking any Aspirin (unless otherwise instructed by your surgeon) Aleve, Naproxen, Ibuprofen, Motrin, Advil, Goody's, BC's, all herbal medications, fish oil, and all vitamins.                     Do not wear jewelry, make up, or nail polish            Do not wear lotions, powders, perfumes, or deodorant.            Do not shave 48 hours prior to surgery.              Do not bring valuables to the hospital.            [facility] is not responsible for any belongings or valuables.  Do NOT Smoke (Tobacco/Vaping) or drink Alcohol 24 hours prior to your procedure If you use a CPAP at night, you may bring all equipment for your overnight stay.   Contacts, glasses, dentures or bridgework may not be worn into surgery.      For patients admitted to the hospital, discharge time will be determined by your treatment team.   Patients discharged the day of surgery will not be allowed to drive home, and someone needs to stay with them for 24 hours.    Special instructions:   [facility]- Preparing For Surgery  Before surgery, you can play an important role. Because skin is not sterile, your skin needs to be as free of germs as possible. You can reduce the number of  germs on your skin by washing with CHG (chlorahexidine gluconate) Soap before surgery.  CHG is an antiseptic cleaner which kills germs and bonds with the skin to continue killing germs even after washing.    Oral Hygiene is also important to reduce your risk of infection.  Remember - BRUSH YOUR TEETH THE MORNING OF SURGERY WITH YOUR REGULAR TOOTHPASTE  Please do not use if you have an allergy to CHG or antibacterial soaps. If your skin becomes reddened/irritated stop using the CHG.  Do not shave (including legs and underarms) for at least 48 hours prior to first CHG shower. It is OK to shave your face.  Please follow these instructions carefully.   1. Shower the NIGHT BEFORE SURGERY and the MORNING OF SURGERY with CHG Soap.   2. If you chose to wash your hair, wash your hair first as usual with your normal shampoo.  3. After you shampoo, rinse your hair and body thoroughly to remove the shampoo.  4. Use CHG as you would any other liquid soap. You can apply CHG directly to the skin and wash gently with a scrungie or a clean washcloth.   5. Apply the CHG Soap to your  body ONLY FROM THE NECK DOWN.  Do not use on open wounds or open sores. Avoid contact with your eyes, ears, mouth and genitals (private parts). Wash Face and genitals (private parts)  with your normal soap.   6. Wash thoroughly, paying special attention to the area where your surgery will be performed.  7. Thoroughly rinse your body with warm water from the neck down.  8. DO NOT shower/wash with your normal soap after using and rinsing off the CHG Soap.  9. Pat yourself dry with a CLEAN TOWEL.  10. Wear CLEAN PAJAMAS to bed the night before surgery  11. Place CLEAN SHEETS on your bed the night of your first shower and DO NOT SLEEP WITH PETS.   Day of Surgery: Wear Clean/Comfortable clothing the morning of surgery Do not apply any deodorants/lotions.   Remember to brush your teeth WITH YOUR REGULAR TOOTHPASTE.   Please  read over the following fact sheets that you were given.

## 2020-04-02 ENCOUNTER — Encounter (HOSPITAL_COMMUNITY)
Admission: RE | Admit: 2020-04-02 | Discharge: 2020-04-02 | Disposition: A | Discharge status: Home / Self Care | Payer: Medicare PPO | Source: Ambulatory Visit | Attending: Neurosurgery | Admitting: Neurosurgery

## 2020-04-02 ENCOUNTER — Encounter (HOSPITAL_COMMUNITY): Payer: Self-pay

## 2020-04-02 ENCOUNTER — Other Ambulatory Visit (HOSPITAL_COMMUNITY)
Admission: RE | Admit: 2020-04-02 | Discharge: 2020-04-02 | Disposition: A | Discharge status: Home / Self Care | Payer: Medicare PPO | Source: Ambulatory Visit | Attending: Neurosurgery | Admitting: Neurosurgery

## 2020-04-02 ENCOUNTER — Other Ambulatory Visit: Payer: Self-pay

## 2020-04-02 DIAGNOSIS — Z20822 Contact with and (suspected) exposure to covid-19: Secondary | ICD-10-CM | POA: Insufficient documentation

## 2020-04-02 DIAGNOSIS — Z01812 Encounter for preprocedural laboratory examination: Secondary | ICD-10-CM | POA: Insufficient documentation

## 2020-04-02 HISTORY — DX: Depression, unspecified: F32.A

## 2020-04-02 LAB — CBC
HCT: 40.4 % (ref 36.0–46.0)
Hemoglobin: 12.9 g/dL (ref 12.0–15.0)
MCH: 29 pg (ref 26.0–34.0)
MCHC: 31.9 g/dL (ref 30.0–36.0)
MCV: 90.8 fL (ref 80.0–100.0)
Platelets: 445 10*3/uL — ABNORMAL HIGH (ref 150–400)
RBC: 4.45 MIL/uL (ref 3.87–5.11)
RDW: 13.9 % (ref 11.5–15.5)
WBC: 10.3 10*3/uL (ref 4.0–10.5)
nRBC: 0 % (ref 0.0–0.2)

## 2020-04-02 LAB — BASIC METABOLIC PANEL
Anion gap: 7 (ref 5–15)
BUN: 18 mg/dL (ref 8–23)
CO2: 28 mmol/L (ref 22–32)
Calcium: 9.2 mg/dL (ref 8.9–10.3)
Chloride: 104 mmol/L (ref 98–111)
Creatinine, Ser: 0.82 mg/dL (ref 0.44–1.00)
GFR, Estimated: >60 mL/min (ref >60)
Glucose, Bld: 175 mg/dL — ABNORMAL HIGH (ref 70–99)
Potassium: 4.2 mmol/L (ref 3.5–5.1)
Sodium: 139 mmol/L (ref 135–145)

## 2020-04-02 LAB — SURGICAL PCR SCREEN
MRSA, PCR: NEGATIVE
Staphylococcus aureus: NEGATIVE

## 2020-04-02 LAB — HEMOGLOBIN A1C
Hgb A1c MFr Bld: 7.4 % — ABNORMAL HIGH (ref 4.8–5.6)
Mean Plasma Glucose: 165.68 mg/dL

## 2020-04-02 LAB — GLUCOSE, CAPILLARY: Glucose-Capillary: 201 mg/dL — ABNORMAL HIGH (ref 70–99)

## 2020-04-02 NOTE — Progress Notes (Signed)
PCP:  Assunta Found, MD Cardiologist:  DEnies  EKG:  11/02/19 CXR:  11/01/19 ECHO:  Denies Stress Test:  Denies Cardiac Cath:  Denies  Fasting Blood Sugar-  Does not know; does not check BG's Checks Blood Sugar_0__ times a day  OSA/CPAP:  No  ASA/Blood Thinners:  No  Covid test 04/02/20  Anesthesia Review:  No  Patient denies shortness of breath, fever, cough, and chest pain at PAT appointment.  Patient verbalized understanding of instructions provided today at the PAT appointment.  Patient asked to review instructions at home and day of surgery.

## 2020-04-03 LAB — TYPE AND SCREEN
ABO/RH(D): A POS
Antibody Screen: NEGATIVE

## 2020-04-03 LAB — SARS CORONAVIRUS 2 (TAT 6-24 HRS): SARS Coronavirus 2: NEGATIVE

## 2020-04-04 ENCOUNTER — Encounter (HOSPITAL_COMMUNITY): Payer: Self-pay | Admitting: Neurosurgery

## 2020-04-04 ENCOUNTER — Encounter (HOSPITAL_COMMUNITY)
Admission: RE | Disposition: A | Discharge status: Home / Self Care | Payer: Self-pay | Source: Home / Self Care | Attending: Neurosurgery

## 2020-04-04 ENCOUNTER — Ambulatory Visit (HOSPITAL_COMMUNITY): Payer: Medicare PPO

## 2020-04-04 ENCOUNTER — Observation Stay (HOSPITAL_COMMUNITY)
Admission: RE | Admit: 2020-04-04 | Discharge: 2020-04-06 | Disposition: A | Discharge status: Home / Self Care | Payer: Medicare PPO | Attending: Neurosurgery | Admitting: Neurosurgery

## 2020-04-04 ENCOUNTER — Ambulatory Visit (HOSPITAL_COMMUNITY): Payer: Medicare PPO | Admitting: Certified Registered"

## 2020-04-04 ENCOUNTER — Other Ambulatory Visit: Payer: Self-pay

## 2020-04-04 ENCOUNTER — Ambulatory Visit (HOSPITAL_COMMUNITY): Payer: Medicare PPO | Admitting: Vascular Surgery

## 2020-04-04 DIAGNOSIS — M5416 Radiculopathy, lumbar region: Secondary | ICD-10-CM | POA: Insufficient documentation

## 2020-04-04 DIAGNOSIS — Z79899 Other long term (current) drug therapy: Secondary | ICD-10-CM | POA: Diagnosis not present

## 2020-04-04 DIAGNOSIS — M4726 Other spondylosis with radiculopathy, lumbar region: Secondary | ICD-10-CM | POA: Diagnosis not present

## 2020-04-04 DIAGNOSIS — M4316 Spondylolisthesis, lumbar region: Principal | ICD-10-CM | POA: Insufficient documentation

## 2020-04-04 DIAGNOSIS — Z981 Arthrodesis status: Secondary | ICD-10-CM | POA: Diagnosis not present

## 2020-04-04 DIAGNOSIS — M48061 Spinal stenosis, lumbar region without neurogenic claudication: Secondary | ICD-10-CM | POA: Diagnosis not present

## 2020-04-04 DIAGNOSIS — E119 Type 2 diabetes mellitus without complications: Secondary | ICD-10-CM | POA: Diagnosis not present

## 2020-04-04 DIAGNOSIS — Z419 Encounter for procedure for purposes other than remedying health state, unspecified: Secondary | ICD-10-CM

## 2020-04-04 DIAGNOSIS — M79605 Pain in left leg: Secondary | ICD-10-CM | POA: Diagnosis present

## 2020-04-04 DIAGNOSIS — I10 Essential (primary) hypertension: Secondary | ICD-10-CM | POA: Insufficient documentation

## 2020-04-04 DIAGNOSIS — M5116 Intervertebral disc disorders with radiculopathy, lumbar region: Secondary | ICD-10-CM | POA: Diagnosis not present

## 2020-04-04 DIAGNOSIS — K219 Gastro-esophageal reflux disease without esophagitis: Secondary | ICD-10-CM | POA: Diagnosis not present

## 2020-04-04 DIAGNOSIS — M5126 Other intervertebral disc displacement, lumbar region: Secondary | ICD-10-CM | POA: Diagnosis not present

## 2020-04-04 HISTORY — PX: TRANSFORAMINAL LUMBAR INTERBODY FUSION (TLIF) WITH PEDICLE SCREW FIXATION 1 LEVEL: SHX6141

## 2020-04-04 LAB — GLUCOSE, CAPILLARY
Glucose-Capillary: 163 mg/dL — ABNORMAL HIGH (ref 70–99)
Glucose-Capillary: 159 mg/dL — ABNORMAL HIGH (ref 70–99)
Glucose-Capillary: 286 mg/dL — ABNORMAL HIGH (ref 70–99)

## 2020-04-04 SURGERY — TRANSFORAMINAL LUMBAR INTERBODY FUSION (TLIF) WITH PEDICLE SCREW FIXATION 1 LEVEL
Anesthesia: General | Site: Back | Laterality: Left

## 2020-04-04 MED ORDER — OXYCODONE HCL 5 MG PO TABS
5.0000 mg | ORAL_TABLET | ORAL | Status: DC | PRN
  Administered 2020-04-04 – 2020-04-05 (×3): 10 mg via ORAL
  Administered 2020-04-05 (×2): 5 mg via ORAL
  Administered 2020-04-05: 10 mg via ORAL
  Administered 2020-04-06: 5 mg via ORAL
  Filled 2020-04-04: qty 1
  Filled 2020-04-04 (×2): qty 2
  Filled 2020-04-04: qty 1
  Filled 2020-04-04 (×2): qty 2
  Filled 2020-04-04: qty 1

## 2020-04-04 MED ORDER — GABAPENTIN 300 MG PO CAPS
300.0000 mg | ORAL_CAPSULE | Freq: Three times a day (TID) | ORAL | Status: DC
  Administered 2020-04-04 – 2020-04-05 (×4): 300 mg via ORAL
  Filled 2020-04-04 (×4): qty 1

## 2020-04-04 MED ORDER — BIOTIN 5000 MCG PO TABS: 5000.0000 ug | ORAL_TABLET | Freq: Every day | ORAL | Status: DC

## 2020-04-04 MED ORDER — FLEET ENEMA 7-19 GM/118ML RE ENEM: 1.0000 | ENEMA | Freq: Once | RECTAL | Status: DC | PRN

## 2020-04-04 MED ORDER — INSULIN ASPART 100 UNIT/ML ~~LOC~~ SOLN
0.0000 [IU] | Freq: Every day | SUBCUTANEOUS | Status: DC
  Administered 2020-04-04: 3 [IU] via SUBCUTANEOUS
  Administered 2020-04-05: 2 [IU] via SUBCUTANEOUS

## 2020-04-04 MED ORDER — FENTANYL CITRATE (PF) 250 MCG/5ML IJ SOLN
INTRAMUSCULAR | Status: AC
  Filled 2020-04-04: qty 5

## 2020-04-04 MED ORDER — HYDROMORPHONE HCL 1 MG/ML IJ SOLN: 0.5000 mg | INTRAMUSCULAR | Status: DC | PRN

## 2020-04-04 MED ORDER — CHLORHEXIDINE GLUCONATE 0.12 % MT SOLN: 15.0000 mL | Freq: Once | OROMUCOSAL | Status: AC

## 2020-04-04 MED ORDER — LIDOCAINE-EPINEPHRINE 1 %-1:100000 IJ SOLN
INTRAMUSCULAR | Status: DC | PRN
  Administered 2020-04-04: 5 mL

## 2020-04-04 MED ORDER — MAGNESIUM OXIDE 400 (241.3 MG) MG PO TABS
ORAL_TABLET | Freq: Every day | ORAL | Status: DC
  Administered 2020-04-04 – 2020-04-05 (×2): 400 mg via ORAL
  Filled 2020-04-04 (×2): qty 1

## 2020-04-04 MED ORDER — DILTIAZEM HCL ER COATED BEADS 180 MG PO CP24
180.0000 mg | ORAL_CAPSULE | Freq: Every day | ORAL | Status: DC
  Administered 2020-04-04 – 2020-04-05 (×2): 180 mg via ORAL
  Filled 2020-04-04 (×4): qty 1

## 2020-04-04 MED ORDER — DIPHENHYDRAMINE HCL 50 MG/ML IJ SOLN
INTRAMUSCULAR | Status: DC | PRN
  Administered 2020-04-04: 12.5 mg via INTRAVENOUS

## 2020-04-04 MED ORDER — INSULIN ASPART 100 UNIT/ML ~~LOC~~ SOLN
0.0000 [IU] | Freq: Three times a day (TID) | SUBCUTANEOUS | Status: DC
  Administered 2020-04-05: 5 [IU] via SUBCUTANEOUS
  Administered 2020-04-05: 3 [IU] via SUBCUTANEOUS
  Administered 2020-04-05: 8 [IU] via SUBCUTANEOUS
  Administered 2020-04-06: 3 [IU] via SUBCUTANEOUS

## 2020-04-04 MED ORDER — 0.9 % SODIUM CHLORIDE (POUR BTL) OPTIME
TOPICAL | Status: DC | PRN
  Administered 2020-04-04: 1000 mL

## 2020-04-04 MED ORDER — DOCUSATE SODIUM 100 MG PO CAPS
100.0000 mg | ORAL_CAPSULE | Freq: Two times a day (BID) | ORAL | Status: DC
  Administered 2020-04-04 – 2020-04-05 (×3): 100 mg via ORAL
  Filled 2020-04-04 (×3): qty 1

## 2020-04-04 MED ORDER — VANCOMYCIN HCL IN DEXTROSE 1-5 GM/200ML-% IV SOLN
1000.0000 mg | INTRAVENOUS | Status: DC
  Filled 2020-04-04: qty 200

## 2020-04-04 MED ORDER — ONDANSETRON HCL 4 MG/2ML IJ SOLN: 4.0000 mg | Freq: Four times a day (QID) | INTRAMUSCULAR | Status: DC | PRN

## 2020-04-04 MED ORDER — POLYETHYLENE GLYCOL 3350 17 G PO PACK: 17.0000 g | PACK | Freq: Every day | ORAL | Status: DC | PRN

## 2020-04-04 MED ORDER — BUPIVACAINE HCL (PF) 0.5 % IJ SOLN
INTRAMUSCULAR | Status: AC
  Filled 2020-04-04: qty 30

## 2020-04-04 MED ORDER — FENTANYL CITRATE (PF) 100 MCG/2ML IJ SOLN
25.0000 ug | INTRAMUSCULAR | Status: DC | PRN
  Administered 2020-04-04 (×3): 50 ug via INTRAVENOUS

## 2020-04-04 MED ORDER — VANCOMYCIN HCL IN DEXTROSE 1-5 GM/200ML-% IV SOLN
1000.0000 mg | Freq: Once | INTRAVENOUS | Status: AC
  Administered 2020-04-05: 1000 mg via INTRAVENOUS
  Filled 2020-04-04: qty 200

## 2020-04-04 MED ORDER — AMITRIPTYLINE HCL 50 MG PO TABS
50.0000 mg | ORAL_TABLET | Freq: Every day | ORAL | Status: DC
  Administered 2020-04-04 – 2020-04-05 (×2): 50 mg via ORAL
  Filled 2020-04-04 (×2): qty 1

## 2020-04-04 MED ORDER — BUPIVACAINE LIPOSOME 1.3 % IJ SUSP
20.0000 mL | Freq: Once | INTRAMUSCULAR | Status: AC
  Administered 2020-04-04: 20 mL
  Filled 2020-04-04: qty 20

## 2020-04-04 MED ORDER — ONDANSETRON HCL 4 MG PO TABS: 4.0000 mg | ORAL_TABLET | Freq: Four times a day (QID) | ORAL | Status: DC | PRN

## 2020-04-04 MED ORDER — CHLORHEXIDINE GLUCONATE 0.12 % MT SOLN
OROMUCOSAL | Status: AC
  Administered 2020-04-04: 15 mL via OROMUCOSAL
  Filled 2020-04-04: qty 15

## 2020-04-04 MED ORDER — FENTANYL CITRATE (PF) 250 MCG/5ML IJ SOLN
INTRAMUSCULAR | Status: DC | PRN
  Administered 2020-04-04: 150 ug via INTRAVENOUS
  Administered 2020-04-04 (×2): 25 ug via INTRAVENOUS

## 2020-04-04 MED ORDER — LIDOCAINE 2% (20 MG/ML) 5 ML SYRINGE
INTRAMUSCULAR | Status: DC | PRN
  Administered 2020-04-04: 60 mg via INTRAVENOUS

## 2020-04-04 MED ORDER — RISAQUAD PO CAPS
1.0000 | ORAL_CAPSULE | Freq: Every day | ORAL | Status: DC
  Administered 2020-04-05: 1 via ORAL
  Filled 2020-04-04 (×5): qty 1

## 2020-04-04 MED ORDER — BISACODYL 10 MG RE SUPP: 10.0000 mg | Freq: Every day | RECTAL | Status: DC | PRN

## 2020-04-04 MED ORDER — CALCIUM CARBONATE-VITAMIN D 500-200 MG-UNIT PO TABS
1.0000 | ORAL_TABLET | Freq: Every day | ORAL | Status: DC
  Administered 2020-04-04 – 2020-04-05 (×2): 1 via ORAL
  Filled 2020-04-04 (×2): qty 1

## 2020-04-04 MED ORDER — CHLORHEXIDINE GLUCONATE CLOTH 2 % EX PADS: 6.0000 | MEDICATED_PAD | Freq: Once | CUTANEOUS | Status: DC

## 2020-04-04 MED ORDER — PROPOFOL 10 MG/ML IV BOLUS
INTRAVENOUS | Status: DC | PRN
  Administered 2020-04-04: 150 mg via INTRAVENOUS

## 2020-04-04 MED ORDER — GABAPENTIN 300 MG PO CAPS: 300.0000 mg | ORAL_CAPSULE | Freq: Two times a day (BID) | ORAL | Status: DC

## 2020-04-04 MED ORDER — ORAL CARE MOUTH RINSE: 15.0000 mL | Freq: Once | OROMUCOSAL | Status: AC

## 2020-04-04 MED ORDER — METHOCARBAMOL 1000 MG/10ML IJ SOLN
500.0000 mg | Freq: Four times a day (QID) | INTRAVENOUS | Status: DC | PRN
  Filled 2020-04-04: qty 5

## 2020-04-04 MED ORDER — TRAZODONE HCL 100 MG PO TABS
100.0000 mg | ORAL_TABLET | Freq: Every day | ORAL | Status: DC
  Administered 2020-04-04 – 2020-04-05 (×2): 100 mg via ORAL
  Filled 2020-04-04 (×2): qty 1

## 2020-04-04 MED ORDER — FENTANYL CITRATE (PF) 100 MCG/2ML IJ SOLN
INTRAMUSCULAR | Status: AC
  Filled 2020-04-04: qty 2

## 2020-04-04 MED ORDER — ACETAMINOPHEN 650 MG RE SUPP: 650.0000 mg | RECTAL | Status: DC | PRN

## 2020-04-04 MED ORDER — SUGAMMADEX SODIUM 200 MG/2ML IV SOLN
INTRAVENOUS | Status: DC | PRN
  Administered 2020-04-04: 200 mg via INTRAVENOUS

## 2020-04-04 MED ORDER — ROCURONIUM BROMIDE 10 MG/ML (PF) SYRINGE
PREFILLED_SYRINGE | INTRAVENOUS | Status: DC | PRN
  Administered 2020-04-04: 30 mg via INTRAVENOUS
  Administered 2020-04-04: 70 mg via INTRAVENOUS

## 2020-04-04 MED ORDER — SODIUM CHLORIDE 0.9 % IV SOLN: 250.0000 mL | INTRAVENOUS | Status: DC

## 2020-04-04 MED ORDER — TRAMADOL HCL 50 MG PO TABS: 50.0000 mg | ORAL_TABLET | Freq: Four times a day (QID) | ORAL | Status: DC | PRN

## 2020-04-04 MED ORDER — ALUM & MAG HYDROXIDE-SIMETH 200-200-20 MG/5ML PO SUSP: 30.0000 mL | Freq: Four times a day (QID) | ORAL | Status: DC | PRN

## 2020-04-04 MED ORDER — SODIUM CHLORIDE 0.9% FLUSH: 3.0000 mL | Freq: Two times a day (BID) | INTRAVENOUS | Status: DC

## 2020-04-04 MED ORDER — LORATADINE 10 MG PO TABS
10.0000 mg | ORAL_TABLET | Freq: Every day | ORAL | Status: DC
  Administered 2020-04-04 – 2020-04-05 (×2): 10 mg via ORAL
  Filled 2020-04-04 (×2): qty 1

## 2020-04-04 MED ORDER — METHOCARBAMOL 500 MG PO TABS
500.0000 mg | ORAL_TABLET | Freq: Four times a day (QID) | ORAL | Status: DC | PRN
  Administered 2020-04-04 – 2020-04-06 (×5): 500 mg via ORAL
  Filled 2020-04-04 (×4): qty 1

## 2020-04-04 MED ORDER — THROMBIN 5000 UNITS EX SOLR
CUTANEOUS | Status: AC
  Filled 2020-04-04: qty 5000

## 2020-04-04 MED ORDER — BUPIVACAINE HCL (PF) 0.5 % IJ SOLN
INTRAMUSCULAR | Status: DC | PRN
  Administered 2020-04-04: 5 mL

## 2020-04-04 MED ORDER — PANTOPRAZOLE SODIUM 40 MG IV SOLR: 40.0000 mg | Freq: Every day | INTRAVENOUS | Status: DC

## 2020-04-04 MED ORDER — ACETAMINOPHEN 500 MG PO TABS: 1000.0000 mg | ORAL_TABLET | Freq: Four times a day (QID) | ORAL | Status: DC | PRN

## 2020-04-04 MED ORDER — SODIUM CHLORIDE 0.9% FLUSH: 3.0000 mL | INTRAVENOUS | Status: DC | PRN

## 2020-04-04 MED ORDER — LACTATED RINGERS IV SOLN: INTRAVENOUS | Status: DC

## 2020-04-04 MED ORDER — NITROGLYCERIN 0.4 MG SL SUBL: 0.4000 mg | SUBLINGUAL_TABLET | SUBLINGUAL | Status: DC | PRN

## 2020-04-04 MED ORDER — LIDOCAINE-EPINEPHRINE 1 %-1:100000 IJ SOLN
INTRAMUSCULAR | Status: AC
  Filled 2020-04-04: qty 1

## 2020-04-04 MED ORDER — KCL IN DEXTROSE-NACL 20-5-0.45 MEQ/L-%-% IV SOLN: INTRAVENOUS | Status: DC

## 2020-04-04 MED ORDER — PHENOL 1.4 % MT LIQD: 1.0000 | OROMUCOSAL | Status: DC | PRN

## 2020-04-04 MED ORDER — METHOCARBAMOL 500 MG PO TABS
ORAL_TABLET | ORAL | Status: AC
  Filled 2020-04-04: qty 1

## 2020-04-04 MED ORDER — VITAMIN D 25 MCG (1000 UNIT) PO TABS
2000.0000 [IU] | ORAL_TABLET | Freq: Every day | ORAL | Status: DC
  Administered 2020-04-04 – 2020-04-05 (×2): 2000 [IU] via ORAL
  Filled 2020-04-04 (×2): qty 2

## 2020-04-04 MED ORDER — THROMBIN 5000 UNITS EX SOLR: OROMUCOSAL | Status: DC | PRN

## 2020-04-04 MED ORDER — MENTHOL 3 MG MT LOZG: 1.0000 | LOZENGE | OROMUCOSAL | Status: DC | PRN

## 2020-04-04 MED ORDER — DEXAMETHASONE SODIUM PHOSPHATE 10 MG/ML IJ SOLN
INTRAMUSCULAR | Status: DC | PRN
  Administered 2020-04-04: 10 mg via INTRAVENOUS

## 2020-04-04 MED ORDER — ALIGN 4 MG PO CAPS
4.0000 mg | ORAL_CAPSULE | Freq: Every day | ORAL | Status: DC
Start: 2020-04-04 — End: 2020-04-04

## 2020-04-04 MED ORDER — ONDANSETRON HCL 4 MG/2ML IJ SOLN
INTRAMUSCULAR | Status: DC | PRN
  Administered 2020-04-04: 4 mg via INTRAVENOUS

## 2020-04-04 MED ORDER — ACETAMINOPHEN 325 MG PO TABS
650.0000 mg | ORAL_TABLET | ORAL | Status: DC | PRN
  Administered 2020-04-04 – 2020-04-06 (×3): 650 mg via ORAL
  Filled 2020-04-04 (×3): qty 2

## 2020-04-04 MED ORDER — PHENYLEPHRINE HCL-NACL 10-0.9 MG/250ML-% IV SOLN
INTRAVENOUS | Status: DC | PRN
  Administered 2020-04-04: 30 ug/min via INTRAVENOUS

## 2020-04-04 MED ORDER — PANTOPRAZOLE SODIUM 40 MG PO TBEC
40.0000 mg | DELAYED_RELEASE_TABLET | Freq: Every day | ORAL | Status: DC
  Administered 2020-04-04 – 2020-04-05 (×2): 40 mg via ORAL
  Filled 2020-04-04 (×2): qty 1

## 2020-04-04 SURGICAL SUPPLY — 86 items
ADH SKN CLS APL DERMABOND .7 (GAUZE/BANDAGES/DRESSINGS) ×1
BAND RUBBER #7 7IN GRN STRL LF (MISCELLANEOUS) ×2 IMPLANT
BASKET BONE COLLECTION (BASKET) ×2 IMPLANT
BLADE CLIPPER SURG (BLADE) IMPLANT
BONE CANC CHIPS 20CC PCAN1/4 (Bone Implant) ×2 IMPLANT
BUR MATCHSTICK NEURO 3.0 LAGG (BURR) ×2 IMPLANT
BUR PRECISION FLUTE 5.0 (BURR) ×2 IMPLANT
CANISTER SUCT 3000ML PPV (MISCELLANEOUS) ×2 IMPLANT
CARTRIDGE OIL MAESTRO DRILL (MISCELLANEOUS) ×1 IMPLANT
CHIPS CANC BONE 20CC PCAN1/4 (Bone Implant) ×1 IMPLANT
CNTNR URN SCR LID CUP LEK RST (MISCELLANEOUS) ×1 IMPLANT
CONT SPEC 4OZ STRL OR WHT (MISCELLANEOUS) ×2
COVER BACK TABLE 24X17X13 BIG (DRAPES) IMPLANT
COVER BACK TABLE 60X90IN (DRAPES) ×2 IMPLANT
COVER WAND RF STERILE (DRAPES) ×1 IMPLANT
DECANTER SPIKE VIAL GLASS SM (MISCELLANEOUS) ×2 IMPLANT
DERMABOND ADVANCED (GAUZE/BANDAGES/DRESSINGS) ×1
DERMABOND ADVANCED .7 DNX12 (GAUZE/BANDAGES/DRESSINGS) ×1 IMPLANT
DIFFUSER DRILL AIR PNEUMATIC (MISCELLANEOUS) ×2 IMPLANT
DRAPE C-ARM 42X72 X-RAY (DRAPES) ×2 IMPLANT
DRAPE C-ARMOR (DRAPES) ×2 IMPLANT
DRAPE LAPAROTOMY 100X72X124 (DRAPES) ×2 IMPLANT
DRAPE MICROSCOPE LEICA (MISCELLANEOUS) ×1 IMPLANT
DRAPE SURG 17X23 STRL (DRAPES) ×2 IMPLANT
DRSG OPSITE POSTOP 4X6 (GAUZE/BANDAGES/DRESSINGS) ×1 IMPLANT
DURAPREP 26ML APPLICATOR (WOUND CARE) ×2 IMPLANT
ELECT REM PT RETURN 9FT ADLT (ELECTROSURGICAL) ×2
ELECTRODE REM PT RTRN 9FT ADLT (ELECTROSURGICAL) ×1 IMPLANT
GAUZE 4X4 16PLY RFD (DISPOSABLE) IMPLANT
GAUZE SPONGE 4X4 12PLY STRL (GAUZE/BANDAGES/DRESSINGS) ×1 IMPLANT
GLOVE BIO SURGEON STRL SZ8 (GLOVE) ×2 IMPLANT
GLOVE BIOGEL PI IND STRL 7.5 (GLOVE) IMPLANT
GLOVE BIOGEL PI IND STRL 8 (GLOVE) ×2 IMPLANT
GLOVE BIOGEL PI IND STRL 8.5 (GLOVE) ×2 IMPLANT
GLOVE BIOGEL PI INDICATOR 7.5 (GLOVE) ×4
GLOVE BIOGEL PI INDICATOR 8 (GLOVE) ×2
GLOVE BIOGEL PI INDICATOR 8.5 (GLOVE) ×2
GLOVE ECLIPSE 8.0 STRL XLNG CF (GLOVE) ×2 IMPLANT
GLOVE EXAM NITRILE XL STR (GLOVE) IMPLANT
GLOVE SURG SS PI 7.0 STRL IVOR (GLOVE) ×1 IMPLANT
GLOVE SURG SS PI 8.0 STRL IVOR (GLOVE) ×5 IMPLANT
GOWN STRL REUS W/ TWL LRG LVL3 (GOWN DISPOSABLE) IMPLANT
GOWN STRL REUS W/ TWL XL LVL3 (GOWN DISPOSABLE) ×2 IMPLANT
GOWN STRL REUS W/TWL 2XL LVL3 (GOWN DISPOSABLE) ×4 IMPLANT
GOWN STRL REUS W/TWL LRG LVL3 (GOWN DISPOSABLE)
GOWN STRL REUS W/TWL XL LVL3 (GOWN DISPOSABLE) ×10
GRAFT BNE CANC CHIPS 1-8 20CC (Bone Implant) IMPLANT
HEMOSTAT POWDER KIT SURGIFOAM (HEMOSTASIS) ×2 IMPLANT
IMPL TLX20 8X11X26 20D (Cage) IMPLANT
KIT BASIN OR (CUSTOM PROCEDURE TRAY) ×2 IMPLANT
KIT INFUSE XX SMALL 0.7CC (Orthopedic Implant) ×1 IMPLANT
KIT POSITION SURG JACKSON T1 (MISCELLANEOUS) ×2 IMPLANT
KIT TURNOVER KIT B (KITS) ×2 IMPLANT
MILL MEDIUM DISP (BLADE) ×1 IMPLANT
NDL HYPO 25X1 1.5 SAFETY (NEEDLE) ×1 IMPLANT
NDL SPNL 18GX3.5 QUINCKE PK (NEEDLE) IMPLANT
NEEDLE HYPO 25X1 1.5 SAFETY (NEEDLE) ×2 IMPLANT
NEEDLE SPNL 18GX3.5 QUINCKE PK (NEEDLE) IMPLANT
NS IRRIG 1000ML POUR BTL (IV SOLUTION) ×2 IMPLANT
OIL CARTRIDGE MAESTRO DRILL (MISCELLANEOUS) ×2
PACK LAMINECTOMY NEURO (CUSTOM PROCEDURE TRAY) ×2 IMPLANT
PAD ARMBOARD 7.5X6 YLW CONV (MISCELLANEOUS) ×6 IMPLANT
PATTIES SURGICAL .5 X.5 (GAUZE/BANDAGES/DRESSINGS) IMPLANT
PATTIES SURGICAL .5 X1 (DISPOSABLE) IMPLANT
PATTIES SURGICAL 1X1 (DISPOSABLE) IMPLANT
ROD RELINE LORDOTIC 5.5X45 (Rod) ×1 IMPLANT
ROD RELINE-O LORD 5.5X40 (Rod) ×1 IMPLANT
SCREW LOCK RELINE 5.5 TULIP (Screw) ×4 IMPLANT
SCREW RELINE-O POLY 5.5X40 (Screw) ×2 IMPLANT
SCREW RELINE-O POLY 6.5X40 (Screw) ×2 IMPLANT
SPONGE LAP 4X18 RFD (DISPOSABLE) IMPLANT
SPONGE SURGIFOAM ABS GEL SZ50 (HEMOSTASIS) ×1 IMPLANT
STAPLER SKIN PROX WIDE 3.9 (STAPLE) IMPLANT
SUT VIC AB 1 CT1 18XBRD ANBCTR (SUTURE) ×2 IMPLANT
SUT VIC AB 1 CT1 8-18 (SUTURE) ×4
SUT VIC AB 2-0 CT1 18 (SUTURE) ×3 IMPLANT
SUT VIC AB 3-0 SH 8-18 (SUTURE) ×4 IMPLANT
SYR 3ML LL SCALE MARK (SYRINGE) ×2 IMPLANT
SYR 5ML LL (SYRINGE) IMPLANT
TLX20 IMPLANT 8X11X26 20D (Cage) ×2 IMPLANT
TOWEL GREEN STERILE (TOWEL DISPOSABLE) ×2 IMPLANT
TOWEL GREEN STERILE FF (TOWEL DISPOSABLE) ×2 IMPLANT
TRAY FOL W/BAG SLVR 16FR STRL (SET/KITS/TRAYS/PACK) IMPLANT
TRAY FOLEY MTR SLVR 16FR STAT (SET/KITS/TRAYS/PACK) ×1 IMPLANT
TRAY FOLEY W/BAG SLVR 16FR LF (SET/KITS/TRAYS/PACK) ×2
WATER STERILE IRR 1000ML POUR (IV SOLUTION) ×2 IMPLANT

## 2020-04-04 NOTE — Brief Op Note (Signed)
04/04/2020  6:05 PM  PATIENT:  Mindy Ellis  69 y.o. female  PRE-OPERATIVE DIAGNOSIS:  Spondylolisthesis, Lumbar region, far lateral disc herniation, spondylosis, stenosis, radiculopathy L 45 level  POST-OPERATIVE DIAGNOSIS:  Spondylolisthesis, Lumbar region, far lateral disc herniation, spondylosis, stenosis, radiculopathy L 45 level  PROCEDURE:  Procedure(s): Left Lumbar four-five Transforaminal lumbar interbody fusion (Left) with microdiscectomy, pedicle screw fixation, posterolateral arthrodesis    SURGEON:  Surgeon(s) and Role:    Erline Levine, MD - Primary  PHYSICIAN ASSISTANT:   ASSISTANTS: Glenford Peers, NP, [place], RN   ANESTHESIA:   general  EBL:  100 mL   BLOOD ADMINISTERED:none  DRAINS: none   LOCAL MEDICATIONS USED:  MARCAINE    and LIDOCAINE   SPECIMEN:  No Specimen  DISPOSITION OF SPECIMEN:  N/A  COUNTS:  YES  TOURNIQUET:  * No tourniquets in log *   DICTATION: Patient is 69 year old woman with mobile spondylolisthesis of L4 on L5 with lumbar stenosis and an extreme lateral disc herniation with a sequestered fragment compressing the left L 4 nerve root in the extraforaminal/extrapedicular space.  . She has a severe left L4 radiculopathy. It was elected to take her to surgery for decompression and TLIF fusion at this level.   Procedure: Patient was placed in a prone position on the [place] table after smooth and uncomplicated induction of general endotracheal anesthesia. Her low back was prepped and draped in usual sterile fashion with betadine scrub and DuraPrep after marking relevant bony anatomy with C arm. Area of incision was infiltrated with local lidocaine. Incision was made to the lumbodorsal fascia was incised and exposure was performed of the L4 through L5 spinous processes laminae facet joint and transverse processes. Intraoperative x-ray was obtained which confirmed correct orientation. A total left hemi-laminectomy of L4 was performed with  disarticulation of the facet joints at this level and thorough decompression was performed of left L4 and L5 nerve roots along with the common dural tube. This decompression was more involved than would be typical of that performed for PLIF alone and included painstaking dissection of adherent ligament compressing the thecal sac and wide decompression of all neural elements. Using the operating microscope, the extreme lateral disc herniation was identified and multiple fragments of disc material were removed.  A thorough discectomy was then performed on the left with preparation of the endplates for grafting a trial spacer was placed this level along with contralateral distractor. 10 cc Bone autograft was packed within the interspace on the left along with extra extra small BMP kit.. An 8 x 11 x 26 mm expandable titanium TLIF cage was placed and positioning was confirmed on X ray.  It was deployed to 15 degrees of lordosis. The posterolateral region was extensively decorticated and pedicle probes were placed at L4 and L5 bilaterally. Intraoperative fluoroscopy confirmed correct orientationin the AP and lateral plane. 40 x 6.5 mm pedicle screws were placed at L5 bilaterally and 40 x 5.5 mm screws placed at L4 bilaterally final x-rays demonstrated well-positioned interbody grafts and pedicle screw fixation. A 40 mm lordotic rod was placed on the right and a 45 mm rod was placed on the left locked down in situ and the posterolateral region was packed with the remaining BMP and 20 cc bone allograft chips on the right after decorticating posterior elements. The wound was irrigated and long-acting Marcaine was injected in the deep musculature. Fascia was closed with 1 Vicryl sutures skin edges were reapproximated 2 and 3-0 Vicryl sutures. The wound  is dressed with Dermabond and an occlusive dressing. The patient was extubated in the operating room and taken to recovery in stable satisfactory condition. She tolerated the  operation well counts were correct at the end of the case.   PLAN OF CARE: Admit for overnight observation  PATIENT DISPOSITION:  PACU - hemodynamically stable.   Delay start of Pharmacological VTE agent (>24hrs) due to surgical blood loss or risk of bleeding: yes

## 2020-04-04 NOTE — Anesthesia Postprocedure Evaluation (Signed)
Anesthesia Post Note  Patient: Mindy Ellis  Procedure(s) Performed: Left Lumbar four-five Transforaminal lumbar interbody fusion (Left Back)     Patient location during evaluation: PACU Anesthesia Type: General Level of consciousness: sedated Pain management: pain level controlled Vital Signs Assessment: post-procedure vital signs reviewed and stable Respiratory status: spontaneous breathing and respiratory function stable Cardiovascular status: stable Postop Assessment: no apparent nausea or vomiting Anesthetic complications: no   No complications documented.  Last Vitals:  Vitals:   04/04/20 1845 04/04/20 1900  BP: 131/75 (!) 166/86  Pulse: 96 100  Resp: (!) 22 16  Temp: 36.6 C (!) 36.4 C  SpO2: 97% 92%    Last Pain:  Vitals:   04/04/20 1900  TempSrc:   PainSc: 6                  Ashok Sawaya DANIEL

## 2020-04-04 NOTE — Progress Notes (Signed)
Pharmacy Antibiotic Note  Mindy Ellis is a 69 y.o. female admitted on 04/04/2020 with surgical prophylaxis.  Pharmacy has been consulted for vancomycin dosing.  Confirmed no drain with RN. Good renal function. Vanc given at 1500  Plan:  Vancomycin 1g x1 at 3am    Height: 5\' 4"  (162.6 cm) Weight: 83 kg (182 lb 14.4 oz) IBW/kg (Calculated) : 54.7  Temp (24hrs), Avg:97.8 F (36.6 C), Min:97 F (36.1 C), Max:98.5 F (36.9 C)  Recent Labs  Lab 04/02/20 1400  WBC 10.3  CREATININE 0.82    Estimated Creatinine Clearance: 65.6 mL/min (by C-G formula based on SCr of 0.82 mg/dL).    Allergies  Allergen Reactions  . Mirtazapine Nausea Only  . Naproxen Sodium     Upsets stomach  . Other     Torcan=caused signs of stroke,  vioxx=headache  . Statins Other (See Comments)    Muscle aches  . Zolpidem Tartrate Other (See Comments)    Ambien= confusion  . Erythromycin Rash  . Penicillins Rash  . Sulfur Rash  . Tetracyclines & Related Rash    Thank you for allowing pharmacy to be a part of this patient's care.  04/04/20, PharmD, BCPS, BCCP Clinical Pharmacist  Please check AMION for all [facility] phone numbers After 10:00 PM, call Main Pharmacy [phone]

## 2020-04-04 NOTE — Anesthesia Preprocedure Evaluation (Signed)
Anesthesia Evaluation  Patient identified by MRN, date of birth, ID band Patient awake    Reviewed: Allergy & Precautions, NPO status , Patient's Chart, lab work & pertinent test results  History of Anesthesia Complications (+) PONV  Airway Mallampati: II  TM Distance: >3 FB Neck ROM: Full    Dental  (+) Teeth Intact   Pulmonary neg pulmonary ROS,    Pulmonary exam normal        Cardiovascular hypertension, Pt. on medications  Rhythm:Regular Rate:Normal     Neuro/Psych Depression negative neurological ROS     GI/Hepatic Neg liver ROS, GERD  Medicated and Controlled,  Endo/Other  diabetes, Well Controlled, Type 2  Renal/GU negative Renal ROS  negative genitourinary   Musculoskeletal  (+) Arthritis , Osteoarthritis,  Fibromyalgia -, narcotic dependentL4/L5 spondylolisthesis   Abdominal (+)  Abdomen: soft. Bowel sounds: normal.  Peds  Hematology negative hematology ROS (+)   Anesthesia Other Findings   Reproductive/Obstetrics                             Anesthesia Physical Anesthesia Plan  ASA: II  Anesthesia Plan: General   Post-op Pain Management:    Induction: Intravenous  PONV Risk Score and Plan: 4 or greater and Ondansetron, Dexamethasone and Treatment may vary due to age or medical condition  Airway Management Planned: Mask and Oral ETT  Additional Equipment: None  Intra-op Plan:   Post-operative Plan: Extubation in OR  Informed Consent: I have reviewed the patients History and Physical, chart, labs and discussed the procedure including the risks, benefits and alternatives for the proposed anesthesia with the patient or authorized representative who has indicated his/her understanding and acceptance.     Dental advisory given  Plan Discussed with: CRNA  Anesthesia Plan Comments: (Lab Results      Component                Value               Date                       WBC                      10.3                04/02/2020                HGB                      12.9                04/02/2020                HCT                      40.4                04/02/2020                MCV                      90.8                04/02/2020                PLT  445 (H)             04/02/2020           Lab Results      Component                Value               Date                      NA                       139                 04/02/2020                K                        4.2                 04/02/2020                CO2                      28                  04/02/2020                GLUCOSE                  175 (H)             04/02/2020                BUN                      18                  04/02/2020                CREATININE               0.82                04/02/2020                CALCIUM                  9.2                 04/02/2020                GFRNONAA                 >60                 04/02/2020                GFRAA                    59 (L)              11/01/2019          )        Anesthesia Quick Evaluation

## 2020-04-04 NOTE — Transfer of Care (Signed)
Immediate Anesthesia Transfer of Care Note  Patient: Mindy Ellis  Procedure(s) Performed: Left Lumbar four-five Transforaminal lumbar interbody fusion (Left Back)  Patient Location: PACU  Anesthesia Type:General  Level of Consciousness: awake  Airway & Oxygen Therapy: Patient Spontanous Breathing and Patient connected to face mask oxygen  Post-op Assessment: Report given to RN and Post -op Vital signs reviewed and stable  Post vital signs: Reviewed and stable  Last Vitals:  Vitals Value Taken Time  BP    Temp    Pulse 102 04/04/20 1815  Resp 14 04/04/20 1815  SpO2 100 % 04/04/20 1815  Vitals shown include unvalidated device data.  Last Pain:  Vitals:   04/04/20 1253  TempSrc:   PainSc: 0-No pain         Complications: No complications documented.

## 2020-04-04 NOTE — Progress Notes (Signed)
PHARMACIST - PHYSICIAN ORDER COMMUNICATION  CONCERNING: P&T Medication Policy on Herbal Medications  DESCRIPTION:  This patient's order for:  Biotin  has been noted.  This product(s) is classified as an "herbal" or natural product. Due to a lack of definitive safety studies or FDA approval, nonstandard manufacturing practices, plus the potential risk of unknown drug-drug interactions while on inpatient medications, the Pharmacy and Therapeutics Committee does not permit the use of "herbal" or natural products of this type within [facility].   ACTION TAKEN: The pharmacy department is unable to verify this order at this time and your patient has been informed of this safety policy. Please reevaluate patient's clinical condition at discharge and address if the herbal or natural product(s) should be resumed at that time.   Noah Delaine, RPh Clinical Pharmacist Please check AMION for all [facility] phone numbers After 10:00 PM, call Main Pharmacy [phone] 04/04/2020 7:55 PM

## 2020-04-04 NOTE — Anesthesia Procedure Notes (Signed)
Procedure Name: Intubation Date/Time: 04/04/2020 3:18 PM Performed by: Atilano Median, DO Pre-anesthesia Checklist: Patient identified, Emergency Drugs available, Suction available and Patient being monitored Patient Re-evaluated:Patient Re-evaluated prior to induction Oxygen Delivery Method: Circle system utilized Preoxygenation: Pre-oxygenation with 100% oxygen Induction Type: IV induction Ventilation: Mask ventilation without difficulty Laryngoscope Size: Miller and 2 Grade View: Grade II Tube type: Oral Tube size: 7.0 mm Number of attempts: 1 Airway Equipment and Method: Stylet Placement Confirmation: ETT inserted through vocal cords under direct vision,  positive ETCO2 and breath sounds checked- equal and bilateral Secured at: 23 cm Tube secured with: Tape Dental Injury: Teeth and Oropharynx as per pre-operative assessment

## 2020-04-04 NOTE — Op Note (Signed)
04/04/2020  6:05 PM  PATIENT:  Mindy Ellis  69 y.o. female  PRE-OPERATIVE DIAGNOSIS:  Spondylolisthesis, Lumbar region, far lateral disc herniation, spondylosis, stenosis, radiculopathy L 45 level  POST-OPERATIVE DIAGNOSIS:  Spondylolisthesis, Lumbar region, far lateral disc herniation, spondylosis, stenosis, radiculopathy L 45 level  PROCEDURE:  Procedure(s): Left Lumbar four-five Transforaminal lumbar interbody fusion (Left) with microdiscectomy, pedicle screw fixation, posterolateral arthrodesis    SURGEON:  Surgeon(s) and Role:    Erline Levine, MD - Primary  PHYSICIAN ASSISTANT:   ASSISTANTS: Glenford Peers, NP, [place], RN   ANESTHESIA:   general  EBL:  100 mL   BLOOD ADMINISTERED:none  DRAINS: none   LOCAL MEDICATIONS USED:  MARCAINE    and LIDOCAINE   SPECIMEN:  No Specimen  DISPOSITION OF SPECIMEN:  N/A  COUNTS:  YES  TOURNIQUET:  * No tourniquets in log *   DICTATION: Patient is 68 year old woman with mobile spondylolisthesis of L4 on L5 with lumbar stenosis and an extreme lateral disc herniation with a sequestered fragment compressing the left L 4 nerve root in the extraforaminal/extrapedicular space.  . She has a severe left L4 radiculopathy. It was elected to take her to surgery for decompression and TLIF fusion at this level.   Procedure: Patient was placed in a prone position on the [place] table after smooth and uncomplicated induction of general endotracheal anesthesia. Her low back was prepped and draped in usual sterile fashion with betadine scrub and DuraPrep after marking relevant bony anatomy with C arm. Area of incision was infiltrated with local lidocaine. Incision was made to the lumbodorsal fascia was incised and exposure was performed of the L4 through L5 spinous processes laminae facet joint and transverse processes. Intraoperative x-ray was obtained which confirmed correct orientation. A total left hemi-laminectomy of L4 was performed with  disarticulation of the facet joints at this level and thorough decompression was performed of left L4 and L5 nerve roots along with the common dural tube. This decompression was more involved than would be typical of that performed for PLIF alone and included painstaking dissection of adherent ligament compressing the thecal sac and wide decompression of all neural elements. Using the operating microscope, the extreme lateral disc herniation was identified and multiple fragments of disc material were removed.  A thorough discectomy was then performed on the left with preparation of the endplates for grafting a trial spacer was placed this level along with contralateral distractor. 10 cc Bone autograft was packed within the interspace on the left along with extra extra small BMP kit.. An 8 x 11 x 26 mm expandable titanium TLIF cage was placed and positioning was confirmed on X ray.  It was deployed to 15 degrees of lordosis. The posterolateral region was extensively decorticated and pedicle probes were placed at L4 and L5 bilaterally. Intraoperative fluoroscopy confirmed correct orientationin the AP and lateral plane. 40 x 6.5 mm pedicle screws were placed at L5 bilaterally and 40 x 5.5 mm screws placed at L4 bilaterally final x-rays demonstrated well-positioned interbody grafts and pedicle screw fixation. A 40 mm lordotic rod was placed on the right and a 45 mm rod was placed on the left locked down in situ and the posterolateral region was packed with the remaining BMP and 20 cc bone allograft chips on the right after decorticating posterior elements. The wound was irrigated and long-acting Marcaine was injected in the deep musculature. Fascia was closed with 1 Vicryl sutures skin edges were reapproximated 2 and 3-0 Vicryl sutures. The wound  is dressed with Dermabond and an occlusive dressing. The patient was extubated in the operating room and taken to recovery in stable satisfactory condition. She tolerated the  operation well counts were correct at the end of the case.   PLAN OF CARE: Admit for overnight observation  PATIENT DISPOSITION:  PACU - hemodynamically stable.   Delay start of Pharmacological VTE agent (>24hrs) due to surgical blood loss or risk of bleeding: yes

## 2020-04-04 NOTE — Interval H&P Note (Signed)
History and Physical Interval Note:  04/04/2020 3:00 PM  Mindy Ellis  has presented today for surgery, with the diagnosis of Spondylolisthesis, Lumbar region.  The various methods of treatment have been discussed with the patient and family. After consideration of risks, benefits and other options for treatment, the patient has consented to  Procedure(s): Left Lumbar four-five Transforaminal lumbar interbody fusion (Left) as a surgical intervention.  The patient's history has been reviewed, patient examined, no change in status, stable for surgery.  I have reviewed the patient's chart and labs.  Questions were answered to the patient's satisfaction.     Dorian Heckle

## 2020-04-04 NOTE — H&P (Signed)
Patient ID:   [id number] Patient: Mindy Ellis  Date of Birth: [birth year] Visit Type: Office Visit   Date: 03/21/2020 09:30 AM Provider: Marchia Meiers. Vertell Limber MD   This 69 year old female presents for MRI Review.  HISTORY OF PRESENT ILLNESS: 1.  MRI Review  Patient returns to review her MRI  I reviewed the patient's contrasted MRI and this is most consistent with a sequestered disc fragment in the far extraforaminal region on the left at the L4-5 level.  The patient continues to have severe pain in her left leg with weakness.  I obtained four view lumbar radiographs which show retrolisthesis of L4 on L5 with significant motion on flexion and extension views consistent with degenerative spondylolisthesis.  In light of these findings, I have recommended the patient undergo surgical decompression of the left L5 for nerve root with TLIF fusion at the L4-5 level.  This is been set up on an expedited basis because of the severity of the patient's pain and weakness.      Medical/Surgical/Interim History Reviewed, no change.  Last detailed document date:09/23/2013.     PAST MEDICAL HISTORY, SURGICAL HISTORY, FAMILY HISTORY, SOCIAL HISTORY AND REVIEW OF SYSTEMS I have reviewed the patient's past medical, surgical, family and social history as well as the comprehensive review of systems as included on the [facility] history form dated 02/09/2019, which I have signed.  Family History: Reviewed, no changes.  Last detailed document date:09/23/2013.   Social History: Reviewed, no changes. Last detailed document date: 09/23/2013.    MEDICATIONS: (added, continued or stopped this visit) Started Medication Directions Instruction Stopped  Claritin Liqui-Gel 10 mg capsule Take 1 tablet daily    fish oil 1200 ORAL TABLET Take 1 tablet daily    hydrochlorothiazide 12.5 mg capsule take 1 capsule by oral route  every day    magnesium 30 mg  tablet Take 1 tablet daily    metformin 500 mg tablet take 0.5 tablet by oral route 2 times every day with morning and evening meals    potassium 10 mg ORAL Take 1 tablet daily    Probiotic 10 billion cell capsule Take 1 tablet daily   03/21/2020 tramadol 50 mg tablet take 1 tablet by oral route  every 6 hours as needed    trazodone 50 mg tablet take 1 tablet by oral route  every day after meals    vitamin d-3 1000 ORAL TABLET Take 1 tablet daily    Wellbutrin SR 150 mg tablet,sustained-release take 1 tablet by oral route  every day    Xanax 0.25 mg tablet take 1 tablet by oral route  every day      ALLERGIES: Ingredient Reaction Medication Name Comment NO KNOWN ALLERGIES    No known allergies. Reviewed, no changes.    PHYSICAL EXAM:  Vitals Date Temp F BP Pulse Ht In Wt Lb BMI BSA Pain Score 03/21/2020  148/83 112 64 180 30.9  0/10     IMPRESSION:  Far lateral disc herniation L4-5 left with a lateral large sequestered disc fragment and with instability at the L4-5 level  PLAN: Proceed with left L4-5 TLIF with pedicle screw fixation and far lateral diskectomy on an expedited basis.  This has been scheduled for 04/04/2020.  Risks and benefits were discussed in detail with the patient as well as patient education was performed.  She wishes to proceed with surgery.  Orders: Diagnostic Procedures: Assessment Procedure M54.16 Lumbar Spine- AP/Lat M54.16 Lumbar Spine- AP/Lat/Flex/Ex Instruction(s)/Education:  Assessment Instruction I10 Lifestyle education Z68.30 Dietary management education, guidance, and counseling Miscellaneous: Assessment  M43.16 LSO Brace  Completed Orders (this encounter) Order Details Reason Side Interpretation Result Initial Treatment Date Region Lifestyle education Patient will follow up with Primary Care Physician.       Dietary management education, guidance, and  counseling Encouraged patient to eat well balanced diet.       Lumbar Spine- AP/Lat/Flex/Ex      03/21/2020 All Levels to All Levels  Assessment/Plan  # Detail Type Description  1. Assessment Herniated nucleus pulposus, lumbar (M51.26).     2. Assessment Radiculopathy, lumbar region (M54.16).     3. Assessment Spondylolisthesis, lumbar region (M43.16).  Plan Orders LSO Brace.     4. Assessment Essential (primary) hypertension (I10).     5. Assessment Body mass index (BMI) 30.0-30.9, adult (Z68.30).  Plan Orders Today's instructions / counseling include(s) Dietary management education, guidance, and counseling. Clinical information/comments: Encouraged patient to eat well balanced diet.       Pain Management Plan Pain Scale: 0/10. Method: Numeric Pain Intensity Scale.     MEDICATIONS PRESCRIBED TODAY    Rx Quantity Refills TRAMADOL HCL 50 mg  90 0           Provider:  Marchia Meiers. Vertell Limber MD  03/21/2020 01:25 PM    Dictation edited by: Marchia Meiers. Vertell Limber    CC Providers: Sharilyn Sites [facility] [address]  [place]  [id number]-   Abbigaile Rockman MD  [address] [place], [place] [address]               Electronically signed by Marchia Meiers. Vertell Limber MD on 03/21/2020 01:25 PM Patient ID:   [id number] Patient: Mindy Ellis  Date of Birth: 01-05-1951 Visit Type: Office Visit   Date: 03/05/2020 12:00 PM Provider: Marchia Meiers. Vertell Limber MD   This 69 year old female presents for meningiomas.  HISTORY OF PRESENT ILLNESS: 1.  meningiomas  Mindy Ellis is a 69 year old female who was well known to this clinic presents today for evaluation of new onset low back pain and left lower extremity radiculopathy.  Good the patient reports that she began having significant low back pain in August of 2021 that has progressively worsened since onset.  She regionally presented to [facility] for  evaluation and was ultimately sent for radiographs and a lumbar MRI.  She has also done course of p.o. Steroids as well as multiple epidural steroid injections.  She reports that the initial dose of p.o. Steroids helped moderately.  She did state that her epidural steroid injections helped minimally.  Overall she has slightly improved with her symptoms.  However, she still has significant low back pain that radiates into her left lower extremity into her showed with left foot numbness mostly affecting her great toe.  She currently rates her pain to be a 0/10 and a 10/10 at its worst.  Her pain is exacerbated with standing.  She has fallen multiple times due to left lower extremity weakness and feeling off balance.  She is currently taking tramadol and gabapentin which helped moderately.  She was taking Flexeril and Norco.   Medications tramadol 50 mg b.i.d., gabapentin 300 mg b.i.d.      Medical/Surgical/Interim History Reviewed, no change.  Last detailed document date:09/23/2013.     Family History: Reviewed, no changes.  Last detailed document date:09/23/2013.   Social History: Reviewed, no changes. Last detailed document date: 09/23/2013.    MEDICATIONS: (  added, continued or stopped this visit) Started Medication Directions Instruction Stopped  Claritin Liqui-Gel 10 mg capsule Take 1 tablet daily    fish oil 1200 ORAL TABLET Take 1 tablet daily    hydrochlorothiazide 12.5 mg capsule take 1 capsule by oral route  every day    magnesium 30 mg tablet Take 1 tablet daily    metformin 500 mg tablet take 0.5 tablet by oral route 2 times every day with morning and evening meals    potassium 10 mg ORAL Take 1 tablet daily    Probiotic 10 billion cell capsule Take 1 tablet daily    trazodone 50 mg tablet take 1 tablet by oral route  every day after meals    vitamin d-3 1000 ORAL TABLET Take 1 tablet daily    Wellbutrin SR 150 mg  tablet,sustained-release take 1 tablet by oral route  every day    Xanax 0.25 mg tablet take 1 tablet by oral route  every day      ALLERGIES: Ingredient Reaction Medication Name Comment NO KNOWN ALLERGIES    No known allergies. Reviewed, no changes.    PHYSICAL EXAM:  Vitals Date Temp F BP Pulse Ht In Wt Lb BMI BSA Pain Score 03/05/2020  155/80 121 64 170 29.18  0/10   PHYSICAL EXAM Details General Level of Distress: no acute distress Overall Appearance: normal    Cardiovascular Cardiac: regular rate and rhythm without murmur  Respiratory Lungs: clear to auscultation  Neurological Recent and Remote Memory: normal Attention Span and Concentration:   normal Language: normal Fund of Knowledge: normal  Right Left Sensation: normal normal Upper Extremity Coordination: normal normal  Lower Extremity Coordination: normal normal  Musculoskeletal Gait and Station: normal  Right Left Upper Extremity Muscle Strength: normal normal Lower Extremity Muscle Strength: normal normal Upper Extremity Muscle Tone:  normal normal Lower Extremity Muscle Tone: normal normal   Motor Strength Upper and lower extremity motor strength was tested in the clinically pertinent muscles. Any abnormal findings will be noted below.   Right Left Knee Extensor:  4/5 Knee Flexor:  4/5 EHL:  4/5   Deep Tendon Reflexes  Right Left Biceps: normal normal Triceps: normal normal Brachioradialis: normal normal Patellar: normal normal Achilles: normal normal  Sensory Sensation was tested at L1 to S1. Any abnormal findings will be noted below.  Right Left L5:  decreased   Cranial Nerves II. Optic Nerve/Visual Fields: normal III. Oculomotor: normal IV. Trochlear: normal V. Trigeminal: normal VI. Abducens: normal VII. Facial: normal VIII. Acoustic/Vestibular: normal IX. Glossopharyngeal: normal X. Vagus: normal XI. Spinal Accessory: normal XII.  Hypoglossal: normal  Motor and other Tests Lhermittes: negative Rhomberg: negative    Right Left Hoffman's: normal normal Clonus: normal normal Babinski: normal normal SLR: negative negative Patrick's Pearlean Brownie): negative negative Toe Walk: normal normal Toe Lift: normal normal Heel Walk: normal normal SI Joint: nontender nontender   Additional Findings:  Left hip abductor 4/5   DIAGNOSTIC RESULTS:  Noncontrast lumbar MRI reviewed with patient in the office.  She has a left extraforaminal herniated nucleus pulposus at the L4-5 level.  There is also mild bilateral subarticular recess stenosis as well as spinal stenosis and left-sided foraminal stenosis.    IMPRESSION:  The patient has been having severe low back pain with a left lower extremity radiculopathy since August of 2021. On exam she does have significant weakness in her left lower extremity and does report falling multiple times due to her balance and weakness.  Her imaging  shows a possible disc or nerve sheath tumor.  It is hard to discern whether it is disc or tumor from the MRI without contrast.  The fragment in question is far lateral on the left side.  Additional imaging is needed to determine whether it is a herniated disc or a nerve sheath tumor.  Once her new imaging has been completed, we will deliver a plan for surgery to remove the area in question.  PLAN: The patient will undergo a stat lumbar MRI with contrast.  She will follow-up in the clinic after her imaging has been completed.  Orders: Diagnostic Procedures: Assessment Procedure M54.16 MRI Spine/lumb With Contrast  Assessment/Plan  # Detail Type Description  1. Assessment Chronic bilateral low back pain with left-sided sciatica (M54.42).     2. Assessment Other chronic pain (G89.29).     3. Assessment Lumbar radiculopathy (M54.16).     4. Assessment Herniated nucleus pulposus, lumbar (M51.26).                    Provider:  Marchia Meiers. Vertell Limber MD  03/06/2020 09:15 AM    Dictation edited by: Fenton Malling, NP    CC Providers: Sharilyn Sites [facility] [address]  [place]  [id number]-   Jayveon Convey MD  [address] [place], [place] [address]               Electronically signed by Fenton Malling NP on 03/06/2020 09:15 AM  on behalf of Marchia Meiers. Vertell Limber MD

## 2020-04-05 ENCOUNTER — Encounter (HOSPITAL_COMMUNITY): Payer: Self-pay | Admitting: Neurosurgery

## 2020-04-05 DIAGNOSIS — Z79899 Other long term (current) drug therapy: Secondary | ICD-10-CM | POA: Diagnosis not present

## 2020-04-05 DIAGNOSIS — M5126 Other intervertebral disc displacement, lumbar region: Secondary | ICD-10-CM | POA: Diagnosis not present

## 2020-04-05 DIAGNOSIS — M5416 Radiculopathy, lumbar region: Secondary | ICD-10-CM | POA: Diagnosis not present

## 2020-04-05 DIAGNOSIS — M4316 Spondylolisthesis, lumbar region: Secondary | ICD-10-CM | POA: Diagnosis not present

## 2020-04-05 DIAGNOSIS — I10 Essential (primary) hypertension: Secondary | ICD-10-CM | POA: Diagnosis not present

## 2020-04-05 LAB — GLUCOSE, CAPILLARY
Glucose-Capillary: 269 mg/dL — ABNORMAL HIGH (ref 70–99)
Glucose-Capillary: 183 mg/dL — ABNORMAL HIGH (ref 70–99)
Glucose-Capillary: 205 mg/dL — ABNORMAL HIGH (ref 70–99)
Glucose-Capillary: 236 mg/dL — ABNORMAL HIGH (ref 70–99)

## 2020-04-05 NOTE — Progress Notes (Signed)
Subjective: Patient reports that she is sleepy and was unable to sleep well last night. Only mild incisional discomfort was noted. No acute events reported over night.   Objective: Vital signs in last 24 hours: Temp:  [97 F (36.1 C)-98.8 F (37.1 C)] 97.9 F (36.6 C) (12/30 0731) Pulse Rate:  [96-118] 118 (12/30 0731) Resp:  [16-22] 17 (12/30 0731) BP: (129-166)/(65-89) 133/65 (12/30 0731) SpO2:  [92 %-100 %] 93 % (12/30 0731) Weight:  [83 kg] 83 kg (12/29 1241)  Intake/Output from previous day: 12/29 0701 - 12/30 0700 In: 440 [IV Piggyback:200] Out: 650 [Urine:550; Blood:100] Intake/Output this shift: No intake/output data recorded.  Physical Exam: Patient is awake, A/O X 4, and conversant. Patient is doing well and is in good spirits. MAEW with good strength that is symmetric bilaterally. Dressing is clean dry intact.  Incision is well approximated with no drainage, erythema, or edema.  Lab Results: Recent Labs    04/02/20 1400  WBC 10.3  HGB 12.9  HCT 40.4  PLT 445*   BMET Recent Labs    04/02/20 1400  NA 139  K 4.2  CL 104  CO2 28  GLUCOSE 175*  BUN 18  CREATININE 0.82  CALCIUM 9.2    Studies/Results: DG Lumbar Spine 2-3 Views  Result Date: 04/04/2020 CLINICAL DATA:  Elective surgery.  L4-L5 TLIF EXAM: LUMBAR SPINE - 2-3 VIEW; DG C-ARM 1-60 MIN COMPARISON:  Preoperative imaging. FINDINGS: Two fluoroscopic spot views of the lumbar spine obtained in the operating room in frontal and lateral projections. Intrapedicular rods at L4 and L5 with interbody spacer. Total fluoroscopy time 39 seconds. Total dose 29.30 mGy. IMPRESSION: Intraoperative fluoroscopy during L4-L5 fusion. Electronically Signed   By: Narda Rutherford M.D.   On: 04/04/2020 17:44   DG C-Arm 1-60 Min  Result Date: 04/04/2020 CLINICAL DATA:  Elective surgery.  L4-L5 TLIF EXAM: LUMBAR SPINE - 2-3 VIEW; DG C-ARM 1-60 MIN COMPARISON:  Preoperative imaging. FINDINGS: Two fluoroscopic spot views of  the lumbar spine obtained in the operating room in frontal and lateral projections. Intrapedicular rods at L4 and L5 with interbody spacer. Total fluoroscopy time 39 seconds. Total dose 29.30 mGy. IMPRESSION: Intraoperative fluoroscopy during L4-L5 fusion. Electronically Signed   By: Narda Rutherford M.D.   On: 04/04/2020 17:44    Assessment/Plan: Patient is post-op day 1 s/p L4/5 TLIF. She is continuing to recover well. She reports mild incisional pain that is well controlled on PO analgesics. Plan to work with PT/OT today. LSO brace when OOB. Continue working on pain control, mobility and ambulating patient. Plan for discharge tomorrow. Discharge medications sent to patient's pharmacy. Follow up in the clinic in 3 weeks with A/P and lateral radiographs.    LOS: 0 days     Council Mechanic, DNP, NP-C 04/05/2020, 7:42 AM

## 2020-04-05 NOTE — Discharge Instructions (Signed)
Wound Care °Remove dressing in 2-3 days °Leave incision open to air. °You may shower. °Do not scrub directly on incision.  °Do not put any creams, lotions, or ointments on incision. °Activity °Walk each and every day, increasing distance each day. °No lifting greater than 5 lbs.  Avoid bending, arching, and twisting. °No driving for 2 weeks; may ride as a passenger locally. °If provided with back brace, wear when out of bed.  It is not necessary to wear in bed. °Diet °Resume your normal diet.  °Return to Work °Will be discussed at you follow up appointment. °Call Your Doctor If Any of These Occur °Redness, drainage, or swelling at the wound.  °Temperature greater than 101 degrees. °Severe pain not relieved by pain medication. °Incision starts to come apart. °Follow Up Appt °Call today for appointment in 2-3 weeks ([phone]) or for problems.  If you have any hardware placed in your spine, you will need an x-ray before your appointment.  °

## 2020-04-05 NOTE — Evaluation (Signed)
Occupational Therapy Evaluation Patient Details Name: Mindy Ellis MRN: [id number] DOB: Aug 24, 1950 Today's Date: 04/05/2020    History of Present Illness Patient is post-op day 1 s/p L4/5 TLIF   Clinical Impression   Patient admitted for the above procedure.  She has decided to stay an additional day for recovery.  OT will see in the morning for full ADL and in room mobility, as well as a review of all education taught today, brarce management and, and hip kit usage.  OT to follow while admitted.  No OT follow up should be needed.  L knee buckle.       Follow Up Recommendations  No OT follow up    Equipment Recommendations  Tub/shower seat;Other (comment) hip kit.     Recommendations for Other Services       Precautions / Restrictions Precautions Precautions: Fall Required Braces or Orthoses: Spinal Brace Spinal Brace: Lumbar corset;Applied in sitting position Restrictions Weight Bearing Restrictions: No      Mobility Bed Mobility Overal bed mobility: Modified Independent                  Transfers Overall transfer level: Needs assistance Equipment used: Rolling walker (2 wheeled) Transfers: Sit to/from [employer] Sit to Stand: Supervision Stand pivot transfers: Min guard            Balance Overall balance assessment: Needs assistance Sitting-balance support: No upper extremity supported Sitting balance-Leahy Scale: Good     Standing balance support: Bilateral upper extremity supported Standing balance-Leahy Scale: Poor Standing balance comment: relies on RW                           ADL either performed or assessed with clinical judgement   ADL Overall ADL's : Needs assistance/impaired     Grooming: Wash/dry hands;Wash/dry face;Supervision/safety;Standing               Lower Body Dressing: Minimal assistance;Sit to/from stand   Toilet Transfer: Supervision/safety;RW   Toileting- Water quality scientist and  Hygiene: Modified independent;Sitting/lateral lean               Vision Baseline Vision/History: No visual deficits Patient Visual Report: No change from baseline       Perception     Praxis      Pertinent Vitals/Pain Pain Assessment: Faces Faces Pain Scale: Hurts little more Pain Location: back and R hip Pain Descriptors / Indicators: Aching Pain Intervention(s): Monitored during session     Hand Dominance Right   Extremity/Trunk Assessment Upper Extremity Assessment Upper Extremity Assessment: Overall WFL for tasks assessed   Lower Extremity Assessment Lower Extremity Assessment: Defer to PT evaluation       Communication Communication Communication: No difficulties   Cognition Arousal/Alertness: Awake/alert Behavior During Therapy: WFL for tasks assessed/performed Overall Cognitive Status: Within Functional Limits for tasks assessed                                                      Home Living Family/patient expects to be discharged to:: Private residence Living Arrangements: Spouse/significant other Available Help at Discharge: Family;Available 24 hours/day Type of Home: House Home Access: Level entry     Home Layout: One level     Bathroom Shower/Tub: Occupational psychologist: Standard  Home Equipment: Walker - 4 wheels;Hand held shower head          Prior Functioning/Environment Level of Independence: Independent with assistive device(s)                 OT Problem List: Impaired balance (sitting and/or standing);Pain      OT Treatment/Interventions: Self-care/ADL training;Therapeutic exercise;Therapeutic activities;Balance training    OT Goals(Current goals can be found in the care plan section) Acute Rehab OT Goals Patient Stated Goal: Return home OT Goal Formulation: With patient Time For Goal Achievement: 04/19/20 ADL Goals Pt Will Perform Grooming: with set-up;standing Pt Will Perform  Lower Body Bathing: with modified independence;sit to/from stand Pt Will Perform Lower Body Dressing: with modified independence;sit to/from stand  OT Frequency: Min 2X/week   Barriers to D/C:    none noted       Co-evaluation              AM-PAC OT "6 Clicks" Daily Activity     Outcome Measure Help from another person eating meals?: None Help from another person taking care of personal grooming?: A Little Help from another person toileting, which includes using toliet, bedpan, or urinal?: A Little Help from another person bathing (including washing, rinsing, drying)?: A Little Help from another person to put on and taking off regular upper body clothing?: None Help from another person to put on and taking off regular lower body clothing?: A Little 6 Click Score: 20   End of Session Equipment Utilized During Treatment: Gait belt;Rolling walker Nurse Communication: Mobility status  Activity Tolerance: Patient tolerated treatment well Patient left: in bed;with call bell/phone within reach;with bed alarm set;with family/visitor present  OT Visit Diagnosis: Unsteadiness on feet (R26.81);Pain Pain - Right/Left: Right Pain - part of body: Hip                Time: 7654-6503 OT Time Calculation (min): 20 min Charges:  OT General Charges $OT Visit: 1 Visit OT Evaluation $OT Eval Moderate Complexity: 1 Mod  04/05/2020  Rich, OTR/L  Acute Rehabilitation Services  Office:  [phone]   Metta Clines 04/05/2020, 10:26 AM

## 2020-04-05 NOTE — TOC Initial Note (Signed)
Transition of Care [facility]) - Initial/Assessment Note    Patient Details  Name: Mindy Ellis MRN: [id number] Date of Birth: 12/22/1950  Transition of [facility]) CM/SW Contact:    Mearl Latin, LCSW Phone Number: 04/05/2020, 11:56 AM  Clinical Narrative:                 CSW received consult for possible home health services at time of discharge. CSW spoke with patient. Patient reported that she would like home health services.  Patient reports preference for a highly rated agency in network with her insurance. CSW sent referral for review with [facility] and it has been accepted for PT/OT/aide. CSW provided Medicare [facility] ratings list.  CSW confirmed PCP and address with patient. Patient states her spouse will come pick her up at discharge. No further questions reported at this time.    Expected Discharge Plan: Home w Home Health Services Barriers to Discharge: No Barriers Identified   Patient Goals and CMS Choice Patient states their goals for this hospitalization and ongoing recovery are:: Return home CMS Medicare.gov Compare Post Acute Care list provided to:: Patient Choice offered to / list presented to : Patient  Expected Discharge Plan and Services Expected Discharge Plan: Home w Home Health Services   Discharge Planning Services: CM Consult Post Acute Care Choice: Home Health Living arrangements for the past 2 months: Single Family Home                           HH Arranged: PT,OT,Nurse's Aide HH Agency: [facility] Date [facility] Agency Contacted: 04/05/20 Time HH Agency Contacted: 1155 Representative spoke with at [facility] Agency: Denyse Amass  Prior Living Arrangements/Services Living arrangements for the past 2 months: Single Family Home Lives with:: Spouse Patient language and need for interpreter reviewed:: Yes Do you feel safe going back to the place where you live?: Yes      Need for Family Participation in Patient Care: No (Comment) Care giver support system in place?:  Yes (comment)   Criminal Activity/Legal Involvement Pertinent to Current Situation/Hospitalization: No - Comment as needed  Activities of Daily Living      Permission Sought/Granted Permission sought to share information with : Facility Industrial/product designer granted to share information with : Yes, Verbal Permission Granted     Permission granted to share info w AGENCY: HH        Emotional Assessment Appearance:: Appears stated age Attitude/Demeanor/Rapport: Engaged Affect (typically observed): Accepting,Appropriate,Pleasant Orientation: : Oriented to Self,Oriented to Place,Oriented to  Time,Oriented to Situation Alcohol / Substance Use: Not Applicable Psych Involvement: No (comment)  Admission diagnosis:  Spondylolisthesis of lumbar region [M43.16] Patient Active Problem List   Diagnosis Date Noted  . Spondylolisthesis of lumbar region 04/04/2020  . Dyspareunia in female 01/04/2019  . Unspecified hereditary and idiopathic peripheral neuropathy 07/07/2013  . Memory changes 07/07/2013  . Type II or unspecified type diabetes mellitus without mention of complication, not stated as uncontrolled 07/07/2013  . Unspecified essential hypertension 07/07/2013   PCP:  Assunta Found, MD Pharmacy:   [facility] [address], [place] - [address] [place] [address] [place] [address] [place] [address] Phone: [phone] Fax: [phone]     Social Determinants of Health (SDOH) Interventions    Readmission Risk Interventions No flowsheet data found.

## 2020-04-05 NOTE — Discharge Summary (Signed)
Physician Discharge Summary  Patient ID: Mindy Ellis MRN: [id number] DOB/AGE: 1950-12-08 69 y.o.  Admit date: 04/04/2020 Discharge date: 04/05/2020  Admission Diagnoses: Spondylolisthesis, Lumbar region, far lateral disc herniation, spondylosis, stenosis, radiculopathy L 45 level  Discharge Diagnoses: Spondylolisthesis, Lumbar region, far lateral disc herniation, spondylosis, stenosis, radiculopathy L 45 level Active Problems:   Spondylolisthesis of lumbar region   Discharged Condition: good  Hospital Course: The patient was admitted on 04/04/2020 and taken to the operating room where the patient underwent decompression and fusion. The patient tolerated the procedure well and was taken to the recovery room and then to the floor in stable condition. The hospital course was routine. There were no complications. The wound remained clean dry and intact. Pt had appropriate back soreness. No complaints of leg pain or new N/T/W. The patient remained afebrile with stable vital signs, and tolerated a regular diet. The patient continued to increase activities, and pain was well controlled with oral pain medications.  Consults: None  Significant Diagnostic Studies: radiology: X-Ray  Treatments: surgery: Left Lumbar four-five Transforaminal lumbar interbody fusion (Left) with microdiscectomy, pedicle screw fixation, posterolateral arthrodesis  Discharge Exam: Blood pressure 133/65, pulse (!) 118, temperature 97.9 F (36.6 C), temperature source Oral, resp. rate 17, height 5\' 4"  (1.626 m), weight 83 kg, SpO2 93 %.  Physical Exam: Patient is awake, A/O X 4, and conversant. Patient is doing well and is in good spirits. MAEW with good strength that is symmetric bilaterally. Dressing is clean dry intact.  Incision is well approximated with no drainage, erythema, or edema.  Disposition:    Allergies as of 04/05/2020      Reactions   Mirtazapine Nausea Only   Naproxen Sodium    Upsets stomach    Other    Torcan=caused signs of stroke,  vioxx=headache   Statins Other (See Comments)   Muscle aches   Zolpidem Tartrate Other (See Comments)   Ambien= confusion   Erythromycin Rash   Penicillins Rash   Sulfur Rash   Tetracyclines & Related Rash      Medication List    STOP taking these medications   traMADol 50 MG tablet Commonly known as: ULTRAM     TAKE these medications   acetaminophen 500 MG tablet Commonly known as: TYLENOL Take 1,000 mg by mouth in the morning and at bedtime.   Align 4 MG Caps Take 4 mg by mouth daily.   amitriptyline 50 MG tablet Commonly known as: ELAVIL Take 50 mg by mouth at bedtime.   Biotin 5000 MCG Tabs Take 5,000 mcg by mouth daily.   CALCIUM + D PO Take 600 mg by mouth daily.   cholecalciferol 25 MCG (1000 UNIT) tablet Commonly known as: VITAMIN D3 Take 2,000 Units by mouth daily.   diltiazem 180 MG 24 hr capsule Commonly known as: DILACOR XR Take 180 mg by mouth daily.   gabapentin 300 MG capsule Commonly known as: NEURONTIN Take 300 mg by mouth 2 (two) times daily.   Investigational - Study Medication Take 1 tablet by mouth daily. Study name:  Additional study details: 1 Tab daily for cholesterol   loratadine 10 MG tablet Commonly known as: CLARITIN Take 10 mg by mouth at bedtime.   MAGNESIUM MALATE PO Take 1 tablet by mouth at bedtime.   nitroGLYCERIN 0.4 MG SL tablet Commonly known as: NITROSTAT Place 1 tablet (0.4 mg total) under the tongue every 5 (five) minutes as needed for chest pain.   traZODone 100 MG tablet Commonly  known as: DESYREL Take 100 mg by mouth at bedtime.        Signed: Marvis Moeller, DNP, NP-C 04/05/2020, 8:06 AM

## 2020-04-05 NOTE — Evaluation (Signed)
Physical Therapy Evaluation Patient Details Name: Mindy Ellis MRN: [id number] DOB: 1950-10-17 Today's Date: 04/05/2020   History of Present Illness  69 y.o. female presenting with LLE pain and weakness. MRI is most consistent with a sequestered disc fragment in the far extraforaminal region on the left at the L4-5 level. Pt underwent L4-5 TLIF on 04/04/2020.  Clinical Impression  Pt presents to PT with deficits in LLE strength, gait, balance, functional mobility, knowledge of back precautions, sensation, power, and endurance. Pt is anxious about back precautions, history of falls, and managing mobility well and correctly in the home setting. Pt abides by back precautions well this session and is able to perform bed mobility, transfers, and gait without physical assistance. Pt will benefit from continued acute PT POC to improve confidence in mobility and to reduce falls risk. PT recommends HHPT at the time of discharge.    Follow Up Recommendations Home health PT;Supervision - Intermittent    Equipment Recommendations  None recommended by PT (pt owns RW)    Recommendations for Other Services       Precautions / Restrictions Precautions Precautions: Fall Precaution Booklet Issued: Yes (comment) Precaution Comments: PT verbally reviews back precautions with patient Required Braces or Orthoses: Spinal Brace Spinal Brace: Lumbar corset;Applied in sitting position Restrictions Weight Bearing Restrictions: No      Mobility  Bed Mobility Overal bed mobility: Modified Independent             General bed mobility comments: increased time and PT cues for log roll    Transfers Overall transfer level: Needs assistance Equipment used: Rolling walker (2 wheeled) Transfers: Sit to/from [employer] Sit to Stand: Supervision Stand pivot transfers: Min guard          Ambulation/Gait Ambulation/Gait assistance: Supervision Gait Distance (Feet): 120  Feet Assistive device: Rolling walker (2 wheeled) Gait Pattern/deviations: Step-to pattern Gait velocity: reduced Gait velocity interpretation: <1.8 ft/sec, indicate of risk for recurrent falls General Gait Details: pt with shrot step-to gait progressing to step-through gait with PT cues, reduced stance time initially on LLE  Stairs            Wheelchair Mobility    Modified Rankin (Stroke Patients Only)       Balance Overall balance assessment: Needs assistance Sitting-balance support: No upper extremity supported Sitting balance-Leahy Scale: Good     Standing balance support: Bilateral upper extremity supported Standing balance-Leahy Scale: Poor Standing balance comment: relies on RW                             Pertinent Vitals/Pain Pain Assessment: Faces Faces Pain Scale: Hurts little more Pain Location: back and R hip Pain Descriptors / Indicators: Aching Pain Intervention(s): Monitored during session    Home Living Family/patient expects to be discharged to:: Private residence Living Arrangements: Spouse/significant other Available Help at Discharge: Family;Available 24 hours/day Type of Home: House Home Access: Level entry Entrance Stairs-Rails: None Entrance Stairs-Number of Steps: 1 Home Layout: One level Home Equipment: Walker - 4 wheels;Hand held shower head      Prior Function Level of Independence: Independent with assistive device(s)         Comments: pt reports she ambulates independently without device at baseline, does requrie assistance for stair negotiation and has a history of falls     Hand Dominance   Dominant Hand: Right    Extremity/Trunk Assessment   Upper Extremity Assessment Upper Extremity Assessment: Overall  WFL for tasks assessed    Lower Extremity Assessment Lower Extremity Assessment: Defer to PT evaluation LLE Deficits / Details: LLE grossly 4/5 LLE Sensation: decreased light touch (plantar surface of  R foot)    Cervical / Trunk Assessment Cervical / Trunk Assessment: Other exceptions Cervical / Trunk Exceptions: s/p TLIF, LSO when up  Communication   Communication: No difficulties  Cognition Arousal/Alertness: Awake/alert Behavior During Therapy: WFL for tasks assessed/performed Overall Cognitive Status: Within Functional Limits for tasks assessed                                        General Comments General comments (skin integrity, edema, etc.): VSS on RA    Exercises     Assessment/Plan    PT Assessment Patient needs continued PT services  PT Problem List Decreased strength;Decreased activity tolerance;Decreased balance;Decreased mobility;Decreased knowledge of use of DME;Decreased safety awareness;Decreased knowledge of precautions;Impaired sensation       PT Treatment Interventions DME instruction;Gait training;Stair training;Functional mobility training;Therapeutic activities;Balance training;Neuromuscular re-education;Patient/family education    PT Goals (Current goals can be found in the Care Plan section)  Acute Rehab PT Goals Patient Stated Goal: Return home PT Goal Formulation: With patient Time For Goal Achievement: 04/19/20 Potential to Achieve Goals: Good    Frequency Min 5X/week   Barriers to discharge        Co-evaluation               AM-PAC PT "6 Clicks" Mobility  Outcome Measure Help needed turning from your back to your side while in a flat bed without using bedrails?: None Help needed moving from lying on your back to sitting on the side of a flat bed without using bedrails?: None Help needed moving to and from a bed to a chair (including a wheelchair)?: A Little Help needed standing up from a chair using your arms (e.g., wheelchair or bedside chair)?: A Little Help needed to walk in hospital room?: A Little Help needed climbing 3-5 steps with a railing? : A Little 6 Click Score: 20    End of Session Equipment  Utilized During Treatment: Back brace Activity Tolerance: Patient tolerated treatment well Patient left: in bed;with call bell/phone within reach;with family/visitor present Nurse Communication: Mobility status PT Visit Diagnosis: Unsteadiness on feet (R26.81);Other abnormalities of gait and mobility (R26.89);Muscle weakness (generalized) (M62.81);Other symptoms and signs involving the nervous system (R29.898)    Time: [id number] PT Time Calculation (min) (ACUTE ONLY): 15 min   Charges:   PT Evaluation $PT Eval Low Complexity: [place], PT, DPT Acute Rehabilitation Pager: [phone]   Zenaida Niece 04/05/2020, 11:03 AM

## 2020-04-06 DIAGNOSIS — I10 Essential (primary) hypertension: Secondary | ICD-10-CM | POA: Diagnosis not present

## 2020-04-06 DIAGNOSIS — M4316 Spondylolisthesis, lumbar region: Secondary | ICD-10-CM | POA: Diagnosis not present

## 2020-04-06 DIAGNOSIS — M5126 Other intervertebral disc displacement, lumbar region: Secondary | ICD-10-CM | POA: Diagnosis not present

## 2020-04-06 DIAGNOSIS — Z79899 Other long term (current) drug therapy: Secondary | ICD-10-CM | POA: Diagnosis not present

## 2020-04-06 DIAGNOSIS — M5416 Radiculopathy, lumbar region: Secondary | ICD-10-CM | POA: Diagnosis not present

## 2020-04-06 LAB — GLUCOSE, CAPILLARY: Glucose-Capillary: 198 mg/dL — ABNORMAL HIGH (ref 70–99)

## 2020-04-06 NOTE — Care Management Obs Status (Signed)
MEDICARE OBSERVATION STATUS NOTIFICATION   Patient Details  Name: Mindy Ellis MRN: [id number] Date of Birth: 03-13-1951   Medicare Observation Status Notification Given:  Yes    Mearl Latin, LCSW 04/06/2020, 8:59 AM

## 2020-04-06 NOTE — Progress Notes (Signed)
Occupational Therapy Treatment Patient Details Name: Mindy Ellis MRN: [id number] DOB: 22-May-1950 Today's Date: 04/06/2020    History of present illness Patient is post-op day 2 s/p L4/5 TLIF   OT comments  Patient is ready for discharge.  Good follow through with all teachings from yesterday.  Patient able to bathe and dress herself.  All questions answered, she will have assist as needed at home via spouse.  No further OT needs.    Follow Up Recommendations  No OT follow up    Equipment Recommendations  Tub/shower seat;Other (comment)    Recommendations for Other Services      Precautions / Restrictions Precautions Precautions: Fall Precaution Booklet Issued: Yes (comment) Required Braces or Orthoses: Spinal Brace Spinal Brace: Lumbar corset;Applied in sitting position Restrictions Weight Bearing Restrictions: No       Mobility Bed Mobility                  Transfers   Equipment used: Rolling walker (2 wheeled)   Sit to Stand: Modified independent (Device/Increase time) Stand pivot transfers: Modified independent (Device/Increase time)            Balance           Standing balance support: Bilateral upper extremity supported Standing balance-Leahy Scale: Fair Standing balance comment: relies on RW                           ADL either performed or assessed with clinical judgement   ADL       Grooming: Wash/dry hands;Wash/dry face;Standing;Sitting;Set up   Upper Body Bathing: Sitting;Independent   Lower Body Bathing: Modified independent;Sit to/from stand   Upper Body Dressing : Independent;Sitting   Lower Body Dressing: Modified independent;Sit to/from stand   Toilet Transfer: Modified Independent;RW   Toileting- Clothing Manipulation and Hygiene: Modified independent;Sitting/lateral lean       Functional mobility during ADLs: Modified independent;Rolling walker                                                                                      General Comments      Pertinent Vitals/ Pain       Pain Assessment: Faces Faces Pain Scale: Hurts little more Pain Location: back and R hip Pain Descriptors / Indicators: Aching;Grimacing Pain Intervention(s): Monitored during session                                                          Frequency  Min 2X/week        Progress Toward Goals  OT Goals(current goals can now be found in the care plan section)  Progress towards OT goals: Progressing toward goals  Acute Rehab OT Goals Patient Stated Goal: Return home OT Goal Formulation: With patient Time For Goal Achievement: 04/19/20  Plan Discharge plan remains appropriate    Co-evaluation  AM-PAC OT "6 Clicks" Daily Activity     Outcome Measure   Help from another person eating meals?: None Help from another person taking care of personal grooming?: None Help from another person toileting, which includes using toliet, bedpan, or urinal?: None Help from another person bathing (including washing, rinsing, drying)?: None Help from another person to put on and taking off regular upper body clothing?: None Help from another person to put on and taking off regular lower body clothing?: None 6 Click Score: 24    End of Session Equipment Utilized During Treatment: Rolling walker  OT Visit Diagnosis: Unsteadiness on feet (R26.81);Pain Pain - Right/Left: Right   Activity Tolerance Patient tolerated treatment well   Patient Left with call bell/phone within reach;in chair   Nurse Communication Mobility status        Time: 4174-0814 OT Time Calculation (min): 32 min  Charges: OT General Charges $OT Visit: 1 Visit OT Treatments $Self Care/Home Management : 23-37 mins  04/06/2020  Rich, OTR/L  Acute Rehabilitation Services  Office:  [phone]    Suzanna Obey 04/06/2020, 9:59  AM

## 2020-04-06 NOTE — Progress Notes (Signed)
Patient alert and oriented, mae's well, voiding adequate amount of urine, swallowing without difficulty, no c/o pain at time of discharge. Patient discharged home with family. Script and discharged instructions given to patient. Patient and family stated understanding of instructions given. Patient has an appointment with Dr.Stern    

## 2020-04-06 NOTE — Progress Notes (Signed)
PT Cancellation Note  Patient Details Name: Mindy Ellis MRN: [id number] DOB: 25-Jun-1950   Cancelled Treatment:    Reason Eval/Treat Not Completed: Other (comment) RN reports that patient has already discharged. Thank you for the opportunity to participate in her care!    Madelaine Etienne, DPT, PN1   Supplemental Physical Therapist [facility]    Pager [phone] Acute Rehab Office [phone]

## 2020-04-16 DIAGNOSIS — M5136 Other intervertebral disc degeneration, lumbar region: Secondary | ICD-10-CM | POA: Diagnosis not present

## 2020-04-16 DIAGNOSIS — M797 Fibromyalgia: Secondary | ICD-10-CM | POA: Diagnosis not present

## 2020-04-16 DIAGNOSIS — G64 Other disorders of peripheral nervous system: Secondary | ICD-10-CM | POA: Diagnosis not present

## 2020-04-16 DIAGNOSIS — Z6832 Body mass index (BMI) 32.0-32.9, adult: Secondary | ICD-10-CM | POA: Diagnosis not present

## 2020-04-18 DIAGNOSIS — M48061 Spinal stenosis, lumbar region without neurogenic claudication: Secondary | ICD-10-CM | POA: Diagnosis not present

## 2020-04-18 DIAGNOSIS — I10 Essential (primary) hypertension: Secondary | ICD-10-CM | POA: Diagnosis not present

## 2020-04-18 DIAGNOSIS — Z4789 Encounter for other orthopedic aftercare: Secondary | ICD-10-CM | POA: Diagnosis not present

## 2020-04-18 DIAGNOSIS — M5126 Other intervertebral disc displacement, lumbar region: Secondary | ICD-10-CM | POA: Diagnosis not present

## 2020-04-18 DIAGNOSIS — M5416 Radiculopathy, lumbar region: Secondary | ICD-10-CM | POA: Diagnosis not present

## 2020-04-18 DIAGNOSIS — M47816 Spondylosis without myelopathy or radiculopathy, lumbar region: Secondary | ICD-10-CM | POA: Diagnosis not present

## 2020-04-18 DIAGNOSIS — M4316 Spondylolisthesis, lumbar region: Secondary | ICD-10-CM | POA: Diagnosis not present

## 2020-04-18 DIAGNOSIS — Z9181 History of falling: Secondary | ICD-10-CM | POA: Diagnosis not present

## 2020-04-18 DIAGNOSIS — E119 Type 2 diabetes mellitus without complications: Secondary | ICD-10-CM | POA: Diagnosis not present

## 2020-04-19 DIAGNOSIS — M4316 Spondylolisthesis, lumbar region: Secondary | ICD-10-CM | POA: Diagnosis not present

## 2020-04-19 DIAGNOSIS — M5126 Other intervertebral disc displacement, lumbar region: Secondary | ICD-10-CM | POA: Diagnosis not present

## 2020-04-19 DIAGNOSIS — Z9181 History of falling: Secondary | ICD-10-CM | POA: Diagnosis not present

## 2020-04-19 DIAGNOSIS — M47816 Spondylosis without myelopathy or radiculopathy, lumbar region: Secondary | ICD-10-CM | POA: Diagnosis not present

## 2020-04-19 DIAGNOSIS — E119 Type 2 diabetes mellitus without complications: Secondary | ICD-10-CM | POA: Diagnosis not present

## 2020-04-19 DIAGNOSIS — Z4789 Encounter for other orthopedic aftercare: Secondary | ICD-10-CM | POA: Diagnosis not present

## 2020-04-19 DIAGNOSIS — I10 Essential (primary) hypertension: Secondary | ICD-10-CM | POA: Diagnosis not present

## 2020-04-19 DIAGNOSIS — M5416 Radiculopathy, lumbar region: Secondary | ICD-10-CM | POA: Diagnosis not present

## 2020-04-19 DIAGNOSIS — M48061 Spinal stenosis, lumbar region without neurogenic claudication: Secondary | ICD-10-CM | POA: Diagnosis not present

## 2020-04-20 DIAGNOSIS — M5126 Other intervertebral disc displacement, lumbar region: Secondary | ICD-10-CM | POA: Diagnosis not present

## 2020-04-20 DIAGNOSIS — M47816 Spondylosis without myelopathy or radiculopathy, lumbar region: Secondary | ICD-10-CM | POA: Diagnosis not present

## 2020-04-20 DIAGNOSIS — Z9181 History of falling: Secondary | ICD-10-CM | POA: Diagnosis not present

## 2020-04-20 DIAGNOSIS — I10 Essential (primary) hypertension: Secondary | ICD-10-CM | POA: Diagnosis not present

## 2020-04-20 DIAGNOSIS — M5416 Radiculopathy, lumbar region: Secondary | ICD-10-CM | POA: Diagnosis not present

## 2020-04-20 DIAGNOSIS — M48061 Spinal stenosis, lumbar region without neurogenic claudication: Secondary | ICD-10-CM | POA: Diagnosis not present

## 2020-04-20 DIAGNOSIS — Z4789 Encounter for other orthopedic aftercare: Secondary | ICD-10-CM | POA: Diagnosis not present

## 2020-04-20 DIAGNOSIS — M4316 Spondylolisthesis, lumbar region: Secondary | ICD-10-CM | POA: Diagnosis not present

## 2020-04-20 DIAGNOSIS — E119 Type 2 diabetes mellitus without complications: Secondary | ICD-10-CM | POA: Diagnosis not present

## 2020-04-24 DIAGNOSIS — M47816 Spondylosis without myelopathy or radiculopathy, lumbar region: Secondary | ICD-10-CM | POA: Diagnosis not present

## 2020-04-24 DIAGNOSIS — I10 Essential (primary) hypertension: Secondary | ICD-10-CM | POA: Diagnosis not present

## 2020-04-24 DIAGNOSIS — Z4789 Encounter for other orthopedic aftercare: Secondary | ICD-10-CM | POA: Diagnosis not present

## 2020-04-24 DIAGNOSIS — M4316 Spondylolisthesis, lumbar region: Secondary | ICD-10-CM | POA: Diagnosis not present

## 2020-04-24 DIAGNOSIS — E119 Type 2 diabetes mellitus without complications: Secondary | ICD-10-CM | POA: Diagnosis not present

## 2020-04-24 DIAGNOSIS — M5126 Other intervertebral disc displacement, lumbar region: Secondary | ICD-10-CM | POA: Diagnosis not present

## 2020-04-24 DIAGNOSIS — M5416 Radiculopathy, lumbar region: Secondary | ICD-10-CM | POA: Diagnosis not present

## 2020-04-24 DIAGNOSIS — M48061 Spinal stenosis, lumbar region without neurogenic claudication: Secondary | ICD-10-CM | POA: Diagnosis not present

## 2020-04-24 DIAGNOSIS — Z9181 History of falling: Secondary | ICD-10-CM | POA: Diagnosis not present

## 2020-04-25 DIAGNOSIS — M5416 Radiculopathy, lumbar region: Secondary | ICD-10-CM | POA: Diagnosis not present

## 2020-04-25 DIAGNOSIS — R030 Elevated blood-pressure reading, without diagnosis of hypertension: Secondary | ICD-10-CM | POA: Insufficient documentation

## 2020-04-27 DIAGNOSIS — E119 Type 2 diabetes mellitus without complications: Secondary | ICD-10-CM | POA: Diagnosis not present

## 2020-04-27 DIAGNOSIS — M47816 Spondylosis without myelopathy or radiculopathy, lumbar region: Secondary | ICD-10-CM | POA: Diagnosis not present

## 2020-04-27 DIAGNOSIS — Z9181 History of falling: Secondary | ICD-10-CM | POA: Diagnosis not present

## 2020-04-27 DIAGNOSIS — M5416 Radiculopathy, lumbar region: Secondary | ICD-10-CM | POA: Diagnosis not present

## 2020-04-27 DIAGNOSIS — M5126 Other intervertebral disc displacement, lumbar region: Secondary | ICD-10-CM | POA: Diagnosis not present

## 2020-04-27 DIAGNOSIS — M48061 Spinal stenosis, lumbar region without neurogenic claudication: Secondary | ICD-10-CM | POA: Diagnosis not present

## 2020-04-27 DIAGNOSIS — I10 Essential (primary) hypertension: Secondary | ICD-10-CM | POA: Diagnosis not present

## 2020-04-27 DIAGNOSIS — Z4789 Encounter for other orthopedic aftercare: Secondary | ICD-10-CM | POA: Diagnosis not present

## 2020-04-27 DIAGNOSIS — M4316 Spondylolisthesis, lumbar region: Secondary | ICD-10-CM | POA: Diagnosis not present

## 2020-05-01 DIAGNOSIS — M5416 Radiculopathy, lumbar region: Secondary | ICD-10-CM | POA: Diagnosis not present

## 2020-05-01 DIAGNOSIS — Z9181 History of falling: Secondary | ICD-10-CM | POA: Diagnosis not present

## 2020-05-01 DIAGNOSIS — I10 Essential (primary) hypertension: Secondary | ICD-10-CM | POA: Diagnosis not present

## 2020-05-01 DIAGNOSIS — M4316 Spondylolisthesis, lumbar region: Secondary | ICD-10-CM | POA: Diagnosis not present

## 2020-05-01 DIAGNOSIS — Z4789 Encounter for other orthopedic aftercare: Secondary | ICD-10-CM | POA: Diagnosis not present

## 2020-05-01 DIAGNOSIS — M48061 Spinal stenosis, lumbar region without neurogenic claudication: Secondary | ICD-10-CM | POA: Diagnosis not present

## 2020-05-01 DIAGNOSIS — M5126 Other intervertebral disc displacement, lumbar region: Secondary | ICD-10-CM | POA: Diagnosis not present

## 2020-05-01 DIAGNOSIS — M47816 Spondylosis without myelopathy or radiculopathy, lumbar region: Secondary | ICD-10-CM | POA: Diagnosis not present

## 2020-05-01 DIAGNOSIS — E119 Type 2 diabetes mellitus without complications: Secondary | ICD-10-CM | POA: Diagnosis not present

## 2020-05-04 DIAGNOSIS — E119 Type 2 diabetes mellitus without complications: Secondary | ICD-10-CM | POA: Diagnosis not present

## 2020-05-04 DIAGNOSIS — I10 Essential (primary) hypertension: Secondary | ICD-10-CM | POA: Diagnosis not present

## 2020-05-04 DIAGNOSIS — M5416 Radiculopathy, lumbar region: Secondary | ICD-10-CM | POA: Diagnosis not present

## 2020-05-04 DIAGNOSIS — M48061 Spinal stenosis, lumbar region without neurogenic claudication: Secondary | ICD-10-CM | POA: Diagnosis not present

## 2020-05-04 DIAGNOSIS — M47816 Spondylosis without myelopathy or radiculopathy, lumbar region: Secondary | ICD-10-CM | POA: Diagnosis not present

## 2020-05-04 DIAGNOSIS — Z9181 History of falling: Secondary | ICD-10-CM | POA: Diagnosis not present

## 2020-05-04 DIAGNOSIS — M4316 Spondylolisthesis, lumbar region: Secondary | ICD-10-CM | POA: Diagnosis not present

## 2020-05-04 DIAGNOSIS — Z4789 Encounter for other orthopedic aftercare: Secondary | ICD-10-CM | POA: Diagnosis not present

## 2020-05-04 DIAGNOSIS — M5126 Other intervertebral disc displacement, lumbar region: Secondary | ICD-10-CM | POA: Diagnosis not present

## 2020-05-07 DIAGNOSIS — M48061 Spinal stenosis, lumbar region without neurogenic claudication: Secondary | ICD-10-CM | POA: Diagnosis not present

## 2020-05-07 DIAGNOSIS — M4316 Spondylolisthesis, lumbar region: Secondary | ICD-10-CM | POA: Diagnosis not present

## 2020-05-07 DIAGNOSIS — E119 Type 2 diabetes mellitus without complications: Secondary | ICD-10-CM | POA: Diagnosis not present

## 2020-05-07 DIAGNOSIS — M47816 Spondylosis without myelopathy or radiculopathy, lumbar region: Secondary | ICD-10-CM | POA: Diagnosis not present

## 2020-05-07 DIAGNOSIS — I10 Essential (primary) hypertension: Secondary | ICD-10-CM | POA: Diagnosis not present

## 2020-05-07 DIAGNOSIS — M5126 Other intervertebral disc displacement, lumbar region: Secondary | ICD-10-CM | POA: Diagnosis not present

## 2020-05-07 DIAGNOSIS — Z4789 Encounter for other orthopedic aftercare: Secondary | ICD-10-CM | POA: Diagnosis not present

## 2020-05-07 DIAGNOSIS — M5416 Radiculopathy, lumbar region: Secondary | ICD-10-CM | POA: Diagnosis not present

## 2020-05-07 DIAGNOSIS — Z9181 History of falling: Secondary | ICD-10-CM | POA: Diagnosis not present

## 2020-05-09 DIAGNOSIS — M5126 Other intervertebral disc displacement, lumbar region: Secondary | ICD-10-CM | POA: Diagnosis not present

## 2020-05-09 DIAGNOSIS — E119 Type 2 diabetes mellitus without complications: Secondary | ICD-10-CM | POA: Diagnosis not present

## 2020-05-09 DIAGNOSIS — Z4789 Encounter for other orthopedic aftercare: Secondary | ICD-10-CM | POA: Diagnosis not present

## 2020-05-09 DIAGNOSIS — M48061 Spinal stenosis, lumbar region without neurogenic claudication: Secondary | ICD-10-CM | POA: Diagnosis not present

## 2020-05-09 DIAGNOSIS — Z9181 History of falling: Secondary | ICD-10-CM | POA: Diagnosis not present

## 2020-05-09 DIAGNOSIS — I10 Essential (primary) hypertension: Secondary | ICD-10-CM | POA: Diagnosis not present

## 2020-05-09 DIAGNOSIS — M47816 Spondylosis without myelopathy or radiculopathy, lumbar region: Secondary | ICD-10-CM | POA: Diagnosis not present

## 2020-05-09 DIAGNOSIS — M5416 Radiculopathy, lumbar region: Secondary | ICD-10-CM | POA: Diagnosis not present

## 2020-05-09 DIAGNOSIS — M4316 Spondylolisthesis, lumbar region: Secondary | ICD-10-CM | POA: Diagnosis not present

## 2020-05-16 DIAGNOSIS — M47816 Spondylosis without myelopathy or radiculopathy, lumbar region: Secondary | ICD-10-CM | POA: Diagnosis not present

## 2020-05-16 DIAGNOSIS — M48061 Spinal stenosis, lumbar region without neurogenic claudication: Secondary | ICD-10-CM | POA: Diagnosis not present

## 2020-05-16 DIAGNOSIS — I10 Essential (primary) hypertension: Secondary | ICD-10-CM | POA: Diagnosis not present

## 2020-05-16 DIAGNOSIS — Z4789 Encounter for other orthopedic aftercare: Secondary | ICD-10-CM | POA: Diagnosis not present

## 2020-05-16 DIAGNOSIS — E119 Type 2 diabetes mellitus without complications: Secondary | ICD-10-CM | POA: Diagnosis not present

## 2020-05-16 DIAGNOSIS — Z9181 History of falling: Secondary | ICD-10-CM | POA: Diagnosis not present

## 2020-05-16 DIAGNOSIS — M5416 Radiculopathy, lumbar region: Secondary | ICD-10-CM | POA: Diagnosis not present

## 2020-05-16 DIAGNOSIS — M4316 Spondylolisthesis, lumbar region: Secondary | ICD-10-CM | POA: Diagnosis not present

## 2020-05-16 DIAGNOSIS — M5126 Other intervertebral disc displacement, lumbar region: Secondary | ICD-10-CM | POA: Diagnosis not present

## 2020-05-22 ENCOUNTER — Other Ambulatory Visit (HOSPITAL_COMMUNITY): Payer: Self-pay | Admitting: Family Medicine

## 2020-05-22 DIAGNOSIS — E119 Type 2 diabetes mellitus without complications: Secondary | ICD-10-CM | POA: Diagnosis not present

## 2020-05-22 DIAGNOSIS — M5416 Radiculopathy, lumbar region: Secondary | ICD-10-CM | POA: Diagnosis not present

## 2020-05-22 DIAGNOSIS — Z1231 Encounter for screening mammogram for malignant neoplasm of breast: Secondary | ICD-10-CM

## 2020-05-22 DIAGNOSIS — Z9181 History of falling: Secondary | ICD-10-CM | POA: Diagnosis not present

## 2020-05-22 DIAGNOSIS — M48061 Spinal stenosis, lumbar region without neurogenic claudication: Secondary | ICD-10-CM | POA: Diagnosis not present

## 2020-05-22 DIAGNOSIS — M4316 Spondylolisthesis, lumbar region: Secondary | ICD-10-CM | POA: Diagnosis not present

## 2020-05-22 DIAGNOSIS — I10 Essential (primary) hypertension: Secondary | ICD-10-CM | POA: Diagnosis not present

## 2020-05-22 DIAGNOSIS — Z4789 Encounter for other orthopedic aftercare: Secondary | ICD-10-CM | POA: Diagnosis not present

## 2020-05-22 DIAGNOSIS — M5126 Other intervertebral disc displacement, lumbar region: Secondary | ICD-10-CM | POA: Diagnosis not present

## 2020-05-22 DIAGNOSIS — M47816 Spondylosis without myelopathy or radiculopathy, lumbar region: Secondary | ICD-10-CM | POA: Diagnosis not present

## 2020-05-29 DIAGNOSIS — M4316 Spondylolisthesis, lumbar region: Secondary | ICD-10-CM | POA: Diagnosis not present

## 2020-05-29 DIAGNOSIS — I10 Essential (primary) hypertension: Secondary | ICD-10-CM | POA: Diagnosis not present

## 2020-05-29 DIAGNOSIS — M47816 Spondylosis without myelopathy or radiculopathy, lumbar region: Secondary | ICD-10-CM | POA: Diagnosis not present

## 2020-05-29 DIAGNOSIS — M5416 Radiculopathy, lumbar region: Secondary | ICD-10-CM | POA: Diagnosis not present

## 2020-05-29 DIAGNOSIS — E119 Type 2 diabetes mellitus without complications: Secondary | ICD-10-CM | POA: Diagnosis not present

## 2020-05-29 DIAGNOSIS — Z4789 Encounter for other orthopedic aftercare: Secondary | ICD-10-CM | POA: Diagnosis not present

## 2020-05-29 DIAGNOSIS — Z9181 History of falling: Secondary | ICD-10-CM | POA: Diagnosis not present

## 2020-05-29 DIAGNOSIS — M5126 Other intervertebral disc displacement, lumbar region: Secondary | ICD-10-CM | POA: Diagnosis not present

## 2020-05-29 DIAGNOSIS — M48061 Spinal stenosis, lumbar region without neurogenic claudication: Secondary | ICD-10-CM | POA: Diagnosis not present

## 2020-05-30 ENCOUNTER — Ambulatory Visit (HOSPITAL_COMMUNITY)
Admission: RE | Admit: 2020-05-30 | Discharge: 2020-05-30 | Disposition: A | Discharge status: Home / Self Care | Payer: Medicare PPO | Source: Ambulatory Visit | Attending: Family Medicine | Admitting: Family Medicine

## 2020-05-30 ENCOUNTER — Other Ambulatory Visit: Payer: Self-pay

## 2020-05-30 DIAGNOSIS — Z1231 Encounter for screening mammogram for malignant neoplasm of breast: Secondary | ICD-10-CM | POA: Diagnosis not present

## 2020-06-08 ENCOUNTER — Other Ambulatory Visit (HOSPITAL_COMMUNITY): Payer: Self-pay | Admitting: Physician Assistant

## 2020-06-08 ENCOUNTER — Other Ambulatory Visit: Payer: Self-pay | Admitting: Physician Assistant

## 2020-06-08 DIAGNOSIS — R928 Other abnormal and inconclusive findings on diagnostic imaging of breast: Secondary | ICD-10-CM

## 2020-06-12 ENCOUNTER — Other Ambulatory Visit: Payer: Self-pay

## 2020-06-12 ENCOUNTER — Ambulatory Visit (HOSPITAL_COMMUNITY)
Admission: RE | Admit: 2020-06-12 | Discharge: 2020-06-12 | Disposition: A | Discharge status: Home / Self Care | Payer: Medicare PPO | Source: Ambulatory Visit | Attending: Physician Assistant | Admitting: Physician Assistant

## 2020-06-12 DIAGNOSIS — R922 Inconclusive mammogram: Secondary | ICD-10-CM | POA: Diagnosis not present

## 2020-06-12 DIAGNOSIS — R928 Other abnormal and inconclusive findings on diagnostic imaging of breast: Secondary | ICD-10-CM

## 2020-06-14 DIAGNOSIS — E559 Vitamin D deficiency, unspecified: Secondary | ICD-10-CM | POA: Diagnosis not present

## 2020-06-14 DIAGNOSIS — M797 Fibromyalgia: Secondary | ICD-10-CM | POA: Diagnosis not present

## 2020-06-14 DIAGNOSIS — I10 Essential (primary) hypertension: Secondary | ICD-10-CM | POA: Diagnosis not present

## 2020-06-14 DIAGNOSIS — Z0000 Encounter for general adult medical examination without abnormal findings: Secondary | ICD-10-CM | POA: Diagnosis not present

## 2020-06-14 DIAGNOSIS — M5136 Other intervertebral disc degeneration, lumbar region: Secondary | ICD-10-CM | POA: Diagnosis not present

## 2020-06-14 DIAGNOSIS — E1140 Type 2 diabetes mellitus with diabetic neuropathy, unspecified: Secondary | ICD-10-CM | POA: Diagnosis not present

## 2020-06-14 DIAGNOSIS — Z6832 Body mass index (BMI) 32.0-32.9, adult: Secondary | ICD-10-CM | POA: Diagnosis not present

## 2020-06-14 DIAGNOSIS — Z1389 Encounter for screening for other disorder: Secondary | ICD-10-CM | POA: Diagnosis not present

## 2020-06-14 DIAGNOSIS — F329 Major depressive disorder, single episode, unspecified: Secondary | ICD-10-CM | POA: Diagnosis not present

## 2020-06-14 DIAGNOSIS — E7849 Other hyperlipidemia: Secondary | ICD-10-CM | POA: Diagnosis not present

## 2020-06-14 DIAGNOSIS — G64 Other disorders of peripheral nervous system: Secondary | ICD-10-CM | POA: Diagnosis not present

## 2020-06-19 ENCOUNTER — Ambulatory Visit (HOSPITAL_COMMUNITY): Payer: Medicare PPO

## 2020-06-25 ENCOUNTER — Other Ambulatory Visit: Payer: Medicare PPO

## 2020-06-27 DIAGNOSIS — I10 Essential (primary) hypertension: Secondary | ICD-10-CM | POA: Diagnosis not present

## 2020-06-27 DIAGNOSIS — M25551 Pain in right hip: Secondary | ICD-10-CM | POA: Insufficient documentation

## 2020-06-27 DIAGNOSIS — M5416 Radiculopathy, lumbar region: Secondary | ICD-10-CM | POA: Diagnosis not present

## 2020-06-27 DIAGNOSIS — Z6831 Body mass index (BMI) 31.0-31.9, adult: Secondary | ICD-10-CM | POA: Diagnosis not present

## 2020-07-03 ENCOUNTER — Other Ambulatory Visit (HOSPITAL_COMMUNITY): Payer: Medicare PPO

## 2020-07-03 ENCOUNTER — Encounter (HOSPITAL_COMMUNITY): Payer: Medicare PPO

## 2020-07-16 DIAGNOSIS — M1611 Unilateral primary osteoarthritis, right hip: Secondary | ICD-10-CM | POA: Diagnosis not present

## 2020-08-14 DIAGNOSIS — L821 Other seborrheic keratosis: Secondary | ICD-10-CM | POA: Diagnosis not present

## 2020-08-14 DIAGNOSIS — L814 Other melanin hyperpigmentation: Secondary | ICD-10-CM | POA: Diagnosis not present

## 2020-08-14 DIAGNOSIS — Z1283 Encounter for screening for malignant neoplasm of skin: Secondary | ICD-10-CM | POA: Diagnosis not present

## 2020-08-14 DIAGNOSIS — L570 Actinic keratosis: Secondary | ICD-10-CM | POA: Diagnosis not present

## 2020-08-30 DIAGNOSIS — M5416 Radiculopathy, lumbar region: Secondary | ICD-10-CM | POA: Diagnosis not present

## 2020-08-30 DIAGNOSIS — M25551 Pain in right hip: Secondary | ICD-10-CM | POA: Diagnosis not present

## 2020-08-30 DIAGNOSIS — M5126 Other intervertebral disc displacement, lumbar region: Secondary | ICD-10-CM | POA: Diagnosis not present

## 2020-08-30 DIAGNOSIS — M4316 Spondylolisthesis, lumbar region: Secondary | ICD-10-CM | POA: Diagnosis not present

## 2020-08-30 DIAGNOSIS — M1611 Unilateral primary osteoarthritis, right hip: Secondary | ICD-10-CM | POA: Diagnosis not present

## 2020-09-04 ENCOUNTER — Ambulatory Visit (INDEPENDENT_AMBULATORY_CARE_PROVIDER_SITE_OTHER): Payer: Medicare PPO

## 2020-09-04 ENCOUNTER — Ambulatory Visit (INDEPENDENT_AMBULATORY_CARE_PROVIDER_SITE_OTHER): Payer: Medicare PPO | Admitting: Podiatry

## 2020-09-04 ENCOUNTER — Encounter: Payer: Self-pay | Admitting: Podiatry

## 2020-09-04 ENCOUNTER — Other Ambulatory Visit: Payer: Self-pay

## 2020-09-04 DIAGNOSIS — M778 Other enthesopathies, not elsewhere classified: Secondary | ICD-10-CM

## 2020-09-04 DIAGNOSIS — M66872 Spontaneous rupture of other tendons, left ankle and foot: Secondary | ICD-10-CM

## 2020-09-04 NOTE — Progress Notes (Signed)
Subjective:  Patient ID: Mindy Ellis, female    DOB: 1950/12/03,  MRN: [id number] HPI Chief Complaint  Patient presents with  . Foot Pain    Medial foot/dorsal midfoot left - aching since fall 6 months ago, "didn't heal right or something", flat foot, some aching in the toes and plantar forefoot, feels stiff at ankle, history of back trouble (surgery for bulging disc)  . New Patient (Initial Visit)    70 y.o. female presents with the above complaint.   ROS: Denies fever chills nausea vomiting muscle aches pains calf pain back pain chest pain shortness of breath.  Past Medical History:  Diagnosis Date  . Abdominal pain   . Acid reflux   . Chronic diarrhea   . DDD (degenerative disc disease)   . Depression   . Diabetes mellitus without complication (HCC)    diet controlled  . Dysphagia   . Fibromyalgia   . GERD (gastroesophageal reflux disease)   . Hypertension   . Meningioma ([place])   . PONV (postoperative nausea and vomiting)    nausea, pt must not vomit due to reflux surgery   Past Surgical History:  Procedure Laterality Date  . ABDOMINAL HYSTERECTOMY  age 62  . ANTERIOR AND POSTERIOR REPAIR N/A 01/18/2013   Procedure: ANTERIOR (CYSTOCELE) ;  Surgeon: Reece Packer, MD;  Location: [facility];  Service: Urology;  Laterality: N/A;  . APPENDECTOMY  age 52   both ovaries removed  . BREAST SURGERY Left 21 yrs ago   benign lump removed  . CHOLECYSTECTOMY    . COLONOSCOPY  11/11/07   NUR  . CYSTOSCOPY N/A 01/18/2013   Procedure: CYSTOSCOPY;  Surgeon: Reece Packer, MD;  Location: [facility];  Service: Urology;  Laterality: N/A;  . EYE SURGERY    . lasix eye surgery Bilateral   . PROCTOSCOPY  01/18/2013   Procedure: PROCTOSCOPY;  Surgeon: Reece Packer, MD;  Location: [facility];  Service: Urology;;  . surgery  for acid reflux  1999  . TONSILLECTOMY    . TRANSFORAMINAL LUMBAR INTERBODY FUSION (TLIF) WITH PEDICLE SCREW FIXATION 1 LEVEL Left 04/04/2020   Procedure: Left  Lumbar four-five Transforaminal lumbar interbody fusion;  Surgeon: Erline Levine, MD;  Location: [facility];  Service: Neurosurgery;  Laterality: Left;  . UPPER GASTROINTESTINAL ENDOSCOPY  2006    Current Outpatient Medications:  .  methocarbamol (ROBAXIN) 500 MG tablet, Take by mouth., Disp: , Rfl:  .  Nitrofurantoin Monohyd Macro (MACROBID PO), Take by mouth., Disp: , Rfl:  .  amitriptyline (ELAVIL) 50 MG tablet, Take 50 mg by mouth at bedtime., Disp: , Rfl:  .  Calcium Carbonate-Vitamin D (CALCIUM + D PO), Take 600 mg by mouth daily., Disp: , Rfl:  .  cholecalciferol (VITAMIN D3) 25 MCG (1000 UNIT) tablet, Take 2,000 Units by mouth daily., Disp: , Rfl:  .  diltiazem (CARDIZEM CD) 180 MG 24 hr capsule, Take 180 mg by mouth daily., Disp: , Rfl:  .  MAGNESIUM MALATE PO, Take 1 tablet by mouth at bedtime., Disp: , Rfl:  .  nitroGLYCERIN (NITROSTAT) 0.4 MG SL tablet, Place 1 tablet (0.4 mg total) under the tongue every 5 (five) minutes as needed for chest pain., Disp: 30 tablet, Rfl: 0 .  traZODone (DESYREL) 100 MG tablet, Take 100 mg by mouth at bedtime., Disp: , Rfl:   Allergies  Allergen Reactions  . Latex   . Mirtazapine Nausea Only  . Naproxen Sodium     Upsets stomach  .  Other     Torcan=caused signs of stroke,  vioxx=headache  . Rofecoxib   . Statins Other (See Comments)    Muscle aches  . Zolpidem Tartrate Other (See Comments)    Ambien= confusion  . Elemental Sulfur Rash  . Erythromycin Rash  . Penicillins Rash  . Tetracyclines & Related Rash   Review of Systems Objective:  There were no vitals filed for this visit.  General: Well developed, nourished, in no acute distress, alert and oriented x3   Dermatological: Skin is warm, dry and supple bilateral. Nails x 10 are well maintained; remaining integument appears unremarkable at this time. There are no open sores, no preulcerative lesions, no rash or signs of infection present.  Vascular: Dorsalis Pedis artery and Posterior  Tibial artery pedal pulses are 2/4 bilateral with immedate capillary fill time. Pedal hair growth present. No varicosities and no lower extremity edema present bilateral.   Neruologic: Grossly intact via light touch bilateral. Vibratory intact via tuning fork bilateral. Protective threshold with Semmes Wienstein monofilament intact to all pedal sites bilateral. Patellar and Achilles deep tendon reflexes 2+ bilateral. No Babinski or clonus noted bilateral.   Musculoskeletal: No gross boney pedal deformities bilateral. No pain, crepitus, or limitation noted with foot and ankle range of motion bilateral. Muscular strength 5/5 in all groups tested bilateral.  Pain on palpation and end range of motion of the second third toes of the left foot.  She also has pain on palpation of the tibialis anterior as it courses beneath the retinaculum extending to the medial aspect of the arch.  Exquisitely tender in this area.  Gait: Unassisted, Nonantalgic.    Radiographs:  Radiographs taken today do not demonstrate any acute osseous abnormalities.  Osseously mature individual with mild osteopenia.  Assessment & Plan:   Assessment: Tibialis anterior tendinitis insertional  Plan: Placed her in a Tri-Lock brace and I would like to follow-up with her in 1 month     [place], [place]

## 2020-09-25 DIAGNOSIS — E6609 Other obesity due to excess calories: Secondary | ICD-10-CM | POA: Diagnosis not present

## 2020-09-25 DIAGNOSIS — M797 Fibromyalgia: Secondary | ICD-10-CM | POA: Diagnosis not present

## 2020-09-25 DIAGNOSIS — G64 Other disorders of peripheral nervous system: Secondary | ICD-10-CM | POA: Diagnosis not present

## 2020-09-25 DIAGNOSIS — M5136 Other intervertebral disc degeneration, lumbar region: Secondary | ICD-10-CM | POA: Diagnosis not present

## 2020-09-25 DIAGNOSIS — F329 Major depressive disorder, single episode, unspecified: Secondary | ICD-10-CM | POA: Diagnosis not present

## 2020-09-25 DIAGNOSIS — I10 Essential (primary) hypertension: Secondary | ICD-10-CM | POA: Diagnosis not present

## 2020-09-25 DIAGNOSIS — E1140 Type 2 diabetes mellitus with diabetic neuropathy, unspecified: Secondary | ICD-10-CM | POA: Diagnosis not present

## 2020-09-25 DIAGNOSIS — Z6830 Body mass index (BMI) 30.0-30.9, adult: Secondary | ICD-10-CM | POA: Diagnosis not present

## 2020-10-10 DIAGNOSIS — E119 Type 2 diabetes mellitus without complications: Secondary | ICD-10-CM | POA: Diagnosis not present

## 2020-10-11 DIAGNOSIS — E1140 Type 2 diabetes mellitus with diabetic neuropathy, unspecified: Secondary | ICD-10-CM | POA: Diagnosis not present

## 2020-10-11 DIAGNOSIS — R748 Abnormal levels of other serum enzymes: Secondary | ICD-10-CM | POA: Diagnosis not present

## 2020-11-01 ENCOUNTER — Other Ambulatory Visit: Payer: Self-pay

## 2020-11-01 ENCOUNTER — Encounter: Payer: Self-pay | Admitting: Podiatry

## 2020-11-01 ENCOUNTER — Ambulatory Visit (INDEPENDENT_AMBULATORY_CARE_PROVIDER_SITE_OTHER): Payer: Medicare PPO | Admitting: Podiatry

## 2020-11-01 DIAGNOSIS — M7989 Other specified soft tissue disorders: Secondary | ICD-10-CM | POA: Diagnosis not present

## 2020-11-01 DIAGNOSIS — M66872 Spontaneous rupture of other tendons, left ankle and foot: Secondary | ICD-10-CM | POA: Diagnosis not present

## 2020-11-01 NOTE — Progress Notes (Signed)
She presents today with her husband for follow-up of her tibialis anterior tendinitis and swelling.  States that she is really not any better and the brace really did not help at all or do anything for it.  Objective: Vital signs are stable she alert rated x3 she has about a 3 cm mass overlying her tibialis anterior of her left ankle.  She has limited dorsiflexion and inversion of the foot when utilizing the tibialis anterior tendon is very well could be a tear of the tendon.  She is also having ankle pain with circumduction of the foot.  Assessment: Probable tear of the tibialis anterior tendon I cannot rule out a soft tissue mass in this area.  Also cannot rule out a medial osteochondral defect of the talus.  Plan: Discussed etiology pathology and surgical therapies but failure of conservative therapies I feel is necessary for evaluation of the soft tissue mass and the ankle joint itself.  She also needs evaluation of the tibialis anterior tendon make sure there is no tears here.  Also looking for differential diagnosis and surgical planning.

## 2020-11-16 ENCOUNTER — Ambulatory Visit
Admission: RE | Admit: 2020-11-16 | Discharge: 2020-11-16 | Disposition: A | Discharge status: Home / Self Care | Payer: Medicare PPO | Source: Ambulatory Visit | Attending: Podiatry | Admitting: Podiatry

## 2020-11-16 ENCOUNTER — Other Ambulatory Visit: Payer: Self-pay

## 2020-11-16 DIAGNOSIS — M66872 Spontaneous rupture of other tendons, left ankle and foot: Secondary | ICD-10-CM

## 2020-11-16 DIAGNOSIS — M7732 Calcaneal spur, left foot: Secondary | ICD-10-CM | POA: Diagnosis not present

## 2020-11-16 DIAGNOSIS — M7989 Other specified soft tissue disorders: Secondary | ICD-10-CM

## 2020-11-16 DIAGNOSIS — R2242 Localized swelling, mass and lump, left lower limb: Secondary | ICD-10-CM | POA: Diagnosis not present

## 2020-11-16 MED ORDER — GADOBENATE DIMEGLUMINE 529 MG/ML IV SOLN
15.0000 mL | Freq: Once | INTRAVENOUS | Status: AC | PRN
  Administered 2020-11-16: 15 mL via INTRAVENOUS

## 2020-11-22 ENCOUNTER — Telehealth: Payer: Self-pay | Admitting: Podiatry

## 2020-11-22 NOTE — Telephone Encounter (Signed)
Patient called our office wanting to know the results of her MRI.

## 2020-12-08 ENCOUNTER — Telehealth: Payer: Medicare PPO | Admitting: Nurse Practitioner

## 2020-12-08 ENCOUNTER — Encounter: Payer: Self-pay | Admitting: Nurse Practitioner

## 2020-12-08 DIAGNOSIS — U071 COVID-19: Secondary | ICD-10-CM

## 2020-12-08 MED ORDER — BENZONATATE 100 MG PO CAPS
100.0000 mg | ORAL_CAPSULE | Freq: Three times a day (TID) | ORAL | 0 refills | Status: AC | PRN
Start: 2020-12-08 — End: 2020-12-18

## 2020-12-08 MED ORDER — MOLNUPIRAVIR EUA 200MG CAPSULE
4.0000 | ORAL_CAPSULE | Freq: Two times a day (BID) | ORAL | 0 refills | Status: AC
Start: 2020-12-08 — End: 2020-12-13

## 2020-12-08 NOTE — Patient Instructions (Signed)
You are being prescribed MOLNUPIRAVIR for COVID-19 infection.  ° ° °Please call the pharmacy or go through the drive through vs going inside if you are picking up the mediation yourself to prevent further spread. If prescribed to a [facility] affiliated pharmacy, a pharmacist will bring the medication out to your car. ° ° °ADMINISTRATION INSTRUCTIONS: °Take with or without food. Swallow the tablets whole. Don't chew, crush, or break the medications because it might not work as well ° °For each dose of the medication, you should be taking FOUR tablets at one time, TWICE a day  ° °Finish your full five-day course of Molnupiravir even if you feel better before you're done. Stopping this medication too early can make it less effective to prevent severe illness related to COVID19.   ° °Molnupiravir is prescribed for YOU ONLY. Don't share it with others, even if they have similar symptoms as you. This medication might not be right for everyone.  ° °Make sure to take steps to protect yourself and others while you're taking this medication in order to get well soon and to prevent others from getting sick with COVID-19. ° ° °**If you are of childbearing potential (any gender) - it is advised to not get pregnant while taking this medication and recommended that condoms are used for female partners the next 3 months after taking the medication out of extreme caution  ° ° °COMMON SIDE EFFECTS: °Diarrhea °Nausea  °Dizziness ° ° ° °If your COVID-19 symptoms get worse, get medical help right away. Call 911 if you experience symptoms such as worsening cough, trouble breathing, chest pain that doesn't go away, confusion, a hard time staying awake, and pale or blue-colored skin. °This medication won't prevent all COVID-19 cases from getting worse.  ° ° °

## 2020-12-08 NOTE — Progress Notes (Signed)
Virtual Visit Consent   Mindy Ellis, you are scheduled for a virtual visit with a [facility] provider today.     Just as with appointments in the office, your consent must be obtained to participate.  Your consent will be active for this visit and any virtual visit you may have with one of our providers in the next 365 days.     If you have a MyChart account, a copy of this consent can be sent to you electronically.  All virtual visits are billed to your insurance company just like a traditional visit in the office.    As this is a virtual visit, video technology does not allow for your provider to perform a traditional examination.  This may limit your provider's ability to fully assess your condition.  If your provider identifies any concerns that need to be evaluated in person or the need to arrange testing (such as labs, EKG, etc.), we will make arrangements to do so.     Although advances in technology are sophisticated, we cannot ensure that it will always work on either your end or our end.  If the connection with a video visit is poor, the visit may have to be switched to a telephone visit.  With either a video or telephone visit, we are not always able to ensure that we have a secure connection.     I need to obtain your verbal consent now.   Are you willing to proceed with your visit today?    Mindy Ellis has provided verbal consent on 12/08/2020 for a virtual visit (video or telephone).   Apolonio Schneiders, FNP   Date: 12/08/2020 11:09 AM   Virtual Visit via Video Note   I, Apolonio Schneiders, connected with  Mindy Ellis  ([id number], 17-Dec-1950) on 12/08/20 at 11:15 AM EDT by a video-enabled telemedicine application and verified that I am speaking with the correct person using two identifiers.  Location: Patient: Virtual Visit Location Patient: Home Provider: Virtual Visit Location Provider: Office/Clinic   I discussed the limitations of evaluation and management by telemedicine  and the availability of in person appointments. The patient expressed understanding and agreed to proceed.    History of Present Illness: Mindy Ellis is a 70 y.o. who identifies as a female who was assigned female at birth, and is being seen today after testing positive for COVID this morning at home. She started to have symptoms two days ago.   She has not had COVID in the past.  She has been vaccinated x2 without a booster.   She denies a history of respiratory illness (Asthma/COPD), she has had bronchitis in the past denies a history of pneumonia.  She has been using Robutussin/Delsym OTC for her cough Her cough is bothering her especially when she lays down, a dry cough only  She has nasal congestion as well Denies SOB or wheezing. Denies chest pain.   Problems:  Patient Active Problem List   Diagnosis Date Noted   Primary osteoarthritis of right hip 07/16/2020   Acute right hip pain 06/27/2020   Elevated blood-pressure reading, without diagnosis of hypertension 04/25/2020   Spondylolisthesis of lumbar region 04/04/2020   Chronic bilateral low back pain with left-sided sciatica 03/05/2020   Herniated nucleus pulposus, lumbar 03/05/2020   Other chronic pain 03/05/2020   Benign meningioma ([place]) 02/14/2019   Body mass index (BMI) 29.0-29.9, adult 02/14/2019   Dyspareunia in female 01/04/2019   Cerebral meningioma ([place]) 08/10/2013  Unspecified hereditary and idiopathic peripheral neuropathy 07/07/2013   Memory changes 07/07/2013   Type II or unspecified type diabetes mellitus without mention of complication, not stated as uncontrolled 07/07/2013   Unspecified essential hypertension 07/07/2013    Allergies:  Allergies  Allergen Reactions   Latex    Mirtazapine Nausea Only   Naproxen Sodium     Upsets stomach   Other     Torcan=caused signs of stroke,  vioxx=headache   Rofecoxib    Statins Other (See Comments)    Muscle aches   Zolpidem Tartrate Other (See Comments)     Ambien= confusion   Elemental Sulfur Rash   Erythromycin Rash   Penicillins Rash   Tetracyclines & Related Rash   Medications:  Current Outpatient Medications:    amitriptyline (ELAVIL) 50 MG tablet, Take 50 mg by mouth at bedtime., Disp: , Rfl:    Calcium Carbonate-Vitamin D (CALCIUM + D PO), Take 600 mg by mouth daily., Disp: , Rfl:    cholecalciferol (VITAMIN D3) 25 MCG (1000 UNIT) tablet, Take 2,000 Units by mouth daily., Disp: , Rfl:    diltiazem (CARDIZEM CD) 180 MG 24 hr capsule, Take 180 mg by mouth daily., Disp: , Rfl:    MAGNESIUM MALATE PO, Take 1 tablet by mouth at bedtime., Disp: , Rfl:    methocarbamol (ROBAXIN) 500 MG tablet, Take by mouth., Disp: , Rfl:    Nitrofurantoin Monohyd Macro (MACROBID PO), Take by mouth., Disp: , Rfl:    nitroGLYCERIN (NITROSTAT) 0.4 MG SL tablet, Place 1 tablet (0.4 mg total) under the tongue every 5 (five) minutes as needed for chest pain., Disp: 30 tablet, Rfl: 0   traZODone (DESYREL) 100 MG tablet, Take 100 mg by mouth at bedtime., Disp: , Rfl:   Observations/Objective: Patient is well-developed, well-nourished in no acute distress.  Resting comfortably home.  Head is normocephalic, atraumatic.  No labored breathing.  Speech is clear and coherent with logical content.  Patient is alert and oriented at baseline.    Assessment and Plan: 1. COVID-19  - molnupiravir EUA 200 mg CAPS; Take 4 capsules (800 mg total) by mouth 2 (two) times daily for 5 days.  Dispense: 40 capsule; Refill: 0 - benzonatate (TESSALON) 100 MG capsule; Take 1 capsule (100 mg total) by mouth 3 (three) times daily as needed for up to 10 days for cough.  Dispense: 20 capsule; Refill: 0    Ay continue an over the counter Coricidin for congestion relief as directed. Encouraged hydration and rest, follow up with any new concerns.   Follow Up Instructions: I discussed the assessment and treatment plan with the patient. The patient was provided an opportunity to ask  questions and all were answered. The patient agreed with the plan and demonstrated an understanding of the instructions.  A copy of instructions were sent to the patient via MyChart.  The patient was advised to call back or seek an in-person evaluation if the symptoms worsen or if the condition fails to improve as anticipated.  Time:  I spent 15 minutes with the patient via telehealth technology discussing the above problems/concerns.    Apolonio Schneiders, FNP

## 2020-12-11 ENCOUNTER — Ambulatory Visit: Admitting: Podiatry

## 2021-01-01 ENCOUNTER — Ambulatory Visit (INDEPENDENT_AMBULATORY_CARE_PROVIDER_SITE_OTHER): Payer: Medicare PPO | Admitting: Podiatry

## 2021-01-01 ENCOUNTER — Encounter: Payer: Self-pay | Admitting: Podiatry

## 2021-01-01 ENCOUNTER — Other Ambulatory Visit: Payer: Self-pay

## 2021-01-01 DIAGNOSIS — M7989 Other specified soft tissue disorders: Secondary | ICD-10-CM

## 2021-01-01 DIAGNOSIS — M66872 Spontaneous rupture of other tendons, left ankle and foot: Secondary | ICD-10-CM

## 2021-01-01 NOTE — Progress Notes (Signed)
She presents today with her husband concerned that her left foot and ankle is still not getting any better if she refers to tibialis anterior area.  States that she still walks with a bit of a slap foot.  States that Dr. Vertell Limber did surgery on her back on the left side back in December and started to have symptoms like this afterwards.  Her MRI is back and she would like to discuss the findings.  Objective: Vital signs are stable she is alert oriented x3.  Pulses are palpable.  She has soft tissue swelling of the anterior medial ankle left with pain of the tibialis anterior tendon and some weakness of the tibialis anterior tendon.  MRI does demonstrate only soft tissue mass around that area.  Does not demonstrate any tear of the tendon itself though the tendon feels fluctuant and weak on palpation.  Assessment: Probable tear or or chronic tendinitis of the tibialis anterior tendon and soft tissue lipoma in that area.  Plan: Discussed etiology pathology and surgical therapies this point I expressed to her that we would open this and look at it make sure that it is not anything bad.  But this very well could be associated with her back surgery.  She understands and is amenable to it.  I did express to her that if there was a tear of the tendon that had to be repaired that she would have to be in a cast in a nonweightbearing fashion she understands that and is amenable to it as well.  She very well may need to return to Dr. Vertell Limber should this not alleviate her symptoms.  I will follow-up with him near future for surgical intervention

## 2021-01-07 DIAGNOSIS — M797 Fibromyalgia: Secondary | ICD-10-CM | POA: Diagnosis not present

## 2021-01-07 DIAGNOSIS — E785 Hyperlipidemia, unspecified: Secondary | ICD-10-CM | POA: Diagnosis not present

## 2021-01-07 DIAGNOSIS — E559 Vitamin D deficiency, unspecified: Secondary | ICD-10-CM | POA: Diagnosis not present

## 2021-01-07 DIAGNOSIS — E1140 Type 2 diabetes mellitus with diabetic neuropathy, unspecified: Secondary | ICD-10-CM | POA: Diagnosis not present

## 2021-01-07 DIAGNOSIS — F329 Major depressive disorder, single episode, unspecified: Secondary | ICD-10-CM | POA: Diagnosis not present

## 2021-01-07 DIAGNOSIS — M5136 Other intervertebral disc degeneration, lumbar region: Secondary | ICD-10-CM | POA: Diagnosis not present

## 2021-01-07 DIAGNOSIS — I10 Essential (primary) hypertension: Secondary | ICD-10-CM | POA: Diagnosis not present

## 2021-01-07 DIAGNOSIS — G64 Other disorders of peripheral nervous system: Secondary | ICD-10-CM | POA: Diagnosis not present

## 2021-01-07 DIAGNOSIS — E6609 Other obesity due to excess calories: Secondary | ICD-10-CM | POA: Diagnosis not present

## 2021-01-09 ENCOUNTER — Telehealth: Payer: Self-pay | Admitting: Urology

## 2021-01-09 NOTE — Telephone Encounter (Signed)
DOS - 02/01/21  REPAIR TIBIALIS ANT TENDON LEFT --- 22583 EXC TUMOR LEFT ANKLE --- 46219   [employer] EFFECTIVE DATE - 12-Apr-2019  The following codes do not require a pre-authorization Created on 01/09/2021 28086 Synovectomy, tendon sheath, foot; flexor 28090 Excision of lesion, tendon, tendon sheath, or capsule (including synovectomy) (eg, cyst or ganglion); foot

## 2021-01-30 ENCOUNTER — Other Ambulatory Visit: Payer: Self-pay | Admitting: Podiatry

## 2021-01-30 MED ORDER — OXYCODONE-ACETAMINOPHEN 10-325 MG PO TABS: 1.0000 | ORAL_TABLET | Freq: Three times a day (TID) | ORAL | 0 refills | Status: AC | PRN

## 2021-01-30 MED ORDER — CLINDAMYCIN HCL 150 MG PO CAPS: 150.0000 mg | ORAL_CAPSULE | Freq: Three times a day (TID) | ORAL | 0 refills | Status: DC

## 2021-01-30 MED ORDER — ONDANSETRON HCL 4 MG PO TABS: 4.0000 mg | ORAL_TABLET | Freq: Three times a day (TID) | ORAL | 0 refills | Status: DC | PRN

## 2021-02-01 DIAGNOSIS — D1779 Benign lipomatous neoplasm of other sites: Secondary | ICD-10-CM | POA: Diagnosis not present

## 2021-02-01 DIAGNOSIS — M25572 Pain in left ankle and joints of left foot: Secondary | ICD-10-CM | POA: Diagnosis not present

## 2021-02-01 DIAGNOSIS — M66872 Spontaneous rupture of other tendons, left ankle and foot: Secondary | ICD-10-CM | POA: Diagnosis not present

## 2021-02-01 DIAGNOSIS — M7989 Other specified soft tissue disorders: Secondary | ICD-10-CM | POA: Diagnosis not present

## 2021-02-01 DIAGNOSIS — S86212A Strain of muscle(s) and tendon(s) of anterior muscle group at lower leg level, left leg, initial encounter: Secondary | ICD-10-CM | POA: Diagnosis not present

## 2021-02-04 ENCOUNTER — Telehealth: Payer: Self-pay | Admitting: *Deleted

## 2021-02-04 NOTE — Telephone Encounter (Signed)
Patient is calling to confirm her appointment on 02/07/21.

## 2021-02-07 ENCOUNTER — Other Ambulatory Visit: Payer: Self-pay

## 2021-02-07 ENCOUNTER — Encounter: Payer: Self-pay | Admitting: Podiatry

## 2021-02-07 ENCOUNTER — Ambulatory Visit (INDEPENDENT_AMBULATORY_CARE_PROVIDER_SITE_OTHER): Payer: Medicare PPO | Admitting: Podiatry

## 2021-02-07 DIAGNOSIS — Z9889 Other specified postprocedural states: Secondary | ICD-10-CM

## 2021-02-07 DIAGNOSIS — M66872 Spontaneous rupture of other tendons, left ankle and foot: Secondary | ICD-10-CM

## 2021-02-07 DIAGNOSIS — M7989 Other specified soft tissue disorders: Secondary | ICD-10-CM

## 2021-02-07 NOTE — Progress Notes (Signed)
She presents today with her husband for her first postop visit status post repair of tibialis anterior tendon and excision lipoma anterior medial ankle left.  She denies fever chills nausea vomiting muscle aches and pains states that she has been ambulating with her cam walker.  Objective: Vital signs are stable alert oriented x3 there is no erythema edema cellulitis drainage or odor.  Incision is intact there is normal skin healing.  She is good good dorsiflexion plantarflexion mild tenderness.  Assessment: Well-healing surgical foot.  Plan: Redressed today dressed a compressive dressing encouraged her to continue the use of the cam walker day and night follow-up with her in next week.

## 2021-02-14 ENCOUNTER — Ambulatory Visit (INDEPENDENT_AMBULATORY_CARE_PROVIDER_SITE_OTHER): Payer: Medicare PPO | Admitting: Podiatry

## 2021-02-14 ENCOUNTER — Other Ambulatory Visit: Payer: Self-pay

## 2021-02-14 ENCOUNTER — Encounter: Payer: Self-pay | Admitting: Podiatry

## 2021-02-14 DIAGNOSIS — M66872 Spontaneous rupture of other tendons, left ankle and foot: Secondary | ICD-10-CM | POA: Diagnosis not present

## 2021-02-14 DIAGNOSIS — M7989 Other specified soft tissue disorders: Secondary | ICD-10-CM

## 2021-02-14 DIAGNOSIS — Z9889 Other specified postprocedural states: Secondary | ICD-10-CM

## 2021-02-14 NOTE — Progress Notes (Signed)
She presents today postop visit date of surgery 02/01/2021 repair tibialis anterior tendon left and excision lipoma of the left ankle.  She states that is feeling pretty good is just really is sore tight she is ready to take a shower.  Objective: Vital signs are stable she is alert oriented times 3 sutures are intact margins well coapted remove the suture ends today margins remain well coapted.  There is no signs of infection there is nice range of motion with minimal tenderness.  Assessment: Well-healing surgical foot.  Plan: Placed her in a compression dressing today provided her with an anklet which she will start wearing next week and encouraged her to continue wearing the cam walker.  I would like to follow-up with her in about 2 weeks.

## 2021-02-26 ENCOUNTER — Ambulatory Visit (INDEPENDENT_AMBULATORY_CARE_PROVIDER_SITE_OTHER): Payer: Medicare PPO | Admitting: Podiatry

## 2021-02-26 ENCOUNTER — Other Ambulatory Visit: Payer: Self-pay

## 2021-02-26 DIAGNOSIS — M66872 Spontaneous rupture of other tendons, left ankle and foot: Secondary | ICD-10-CM

## 2021-02-26 NOTE — Progress Notes (Signed)
She presents today for follow-up of her tibialis anterior tendon repair she states that she is still wearing her compression sleeve in her cam boot.  States that it just feels a little stiff as she refers to her left anterior tibial tendon.  Objective: Vital signs are stable alert and oriented x3.  There is no erythema to some mild edema no cellulitis drainage or odor she is got good range of motion on dorsiflexion and plantarflexion and on palpation of the foot is nonpainful.  Assessment: Well-healing surgical foot left.  Plan: Recommended that she continue the use of the cam walker for another week and then try to transition to tennis shoes I would like to follow back with her in 2 weeks at which time I would like to see her in 10 issues.  May need to consider physical therapy at that time.

## 2021-03-12 ENCOUNTER — Encounter: Payer: Medicare PPO | Admitting: Podiatry

## 2021-03-14 ENCOUNTER — Ambulatory Visit (INDEPENDENT_AMBULATORY_CARE_PROVIDER_SITE_OTHER): Payer: Medicare PPO | Admitting: Podiatry

## 2021-03-14 ENCOUNTER — Other Ambulatory Visit: Payer: Self-pay

## 2021-03-14 DIAGNOSIS — Z9889 Other specified postprocedural states: Secondary | ICD-10-CM

## 2021-03-14 DIAGNOSIS — M66872 Spontaneous rupture of other tendons, left ankle and foot: Secondary | ICD-10-CM

## 2021-03-14 DIAGNOSIS — M7989 Other specified soft tissue disorders: Secondary | ICD-10-CM

## 2021-03-16 NOTE — Progress Notes (Signed)
She presents today for follow-up of her tibialis anterior tendon repair and excision of soft tissue mass.  States that is doing well no problems whatsoever date of surgery 02/01/2021.  Some numbness occasionally in her feet but otherwise not too bad.  Objective: No pain on palpation tibialis anterior she got full range of motion and strength.  Assessment: Well-healing surgical foot.  Plan: Follow-up with me as needed

## 2021-04-11 ENCOUNTER — Other Ambulatory Visit: Payer: Self-pay

## 2021-04-11 ENCOUNTER — Encounter: Payer: Self-pay | Admitting: Podiatry

## 2021-04-11 ENCOUNTER — Ambulatory Visit (INDEPENDENT_AMBULATORY_CARE_PROVIDER_SITE_OTHER): Payer: Medicare PPO | Admitting: Podiatry

## 2021-04-11 DIAGNOSIS — G5762 Lesion of plantar nerve, left lower limb: Secondary | ICD-10-CM | POA: Diagnosis not present

## 2021-04-11 DIAGNOSIS — Z9889 Other specified postprocedural states: Secondary | ICD-10-CM

## 2021-04-11 DIAGNOSIS — G5782 Other specified mononeuropathies of left lower limb: Secondary | ICD-10-CM

## 2021-04-11 DIAGNOSIS — M66872 Spontaneous rupture of other tendons, left ankle and foot: Secondary | ICD-10-CM

## 2021-04-11 DIAGNOSIS — M7989 Other specified soft tissue disorders: Secondary | ICD-10-CM

## 2021-04-11 MED ORDER — TRIAMCINOLONE ACETONIDE 40 MG/ML IJ SUSP
20.0000 mg | Freq: Once | INTRAMUSCULAR | Status: AC
  Administered 2021-04-11: 20 mg

## 2021-04-11 NOTE — Progress Notes (Signed)
She presents today for postop visit of her tibialis anterior tendon surgery date of surgery 02/01/2021 she states that is doing very well though she does have some tenderness right over here she refers to her third interdigital space.  Objective: Vital signs stable alert oriented x3 has great recovery from her tibialis anterior tendon repair she has a good range of motion no pain.  She does have pain on palpation with a palpable Mulder's click third interdigital space left foot.  Assessment: Pain in limb secondary to neuroma well-healing surgical foot left.  Plan: Injected the third interspace today Kenalog local anesthetic.  Continue her at home physical therapy to help break down the scar tissue underlying subcutaneous tissue overlying her incision.  Follow-up with her in 1 month

## 2021-04-24 DIAGNOSIS — M4316 Spondylolisthesis, lumbar region: Secondary | ICD-10-CM | POA: Diagnosis not present

## 2021-05-01 ENCOUNTER — Other Ambulatory Visit (HOSPITAL_COMMUNITY): Payer: Self-pay | Admitting: Family Medicine

## 2021-05-01 ENCOUNTER — Other Ambulatory Visit: Payer: Self-pay | Admitting: Family Medicine

## 2021-05-01 DIAGNOSIS — I10 Essential (primary) hypertension: Secondary | ICD-10-CM | POA: Diagnosis not present

## 2021-05-01 DIAGNOSIS — E1140 Type 2 diabetes mellitus with diabetic neuropathy, unspecified: Secondary | ICD-10-CM | POA: Diagnosis not present

## 2021-05-01 DIAGNOSIS — M5136 Other intervertebral disc degeneration, lumbar region: Secondary | ICD-10-CM | POA: Diagnosis not present

## 2021-05-01 DIAGNOSIS — R946 Abnormal results of thyroid function studies: Secondary | ICD-10-CM | POA: Diagnosis not present

## 2021-05-01 DIAGNOSIS — E6609 Other obesity due to excess calories: Secondary | ICD-10-CM | POA: Diagnosis not present

## 2021-05-01 DIAGNOSIS — F329 Major depressive disorder, single episode, unspecified: Secondary | ICD-10-CM | POA: Diagnosis not present

## 2021-05-01 DIAGNOSIS — E785 Hyperlipidemia, unspecified: Secondary | ICD-10-CM | POA: Diagnosis not present

## 2021-05-01 DIAGNOSIS — Z6830 Body mass index (BMI) 30.0-30.9, adult: Secondary | ICD-10-CM | POA: Diagnosis not present

## 2021-05-01 DIAGNOSIS — E559 Vitamin D deficiency, unspecified: Secondary | ICD-10-CM | POA: Diagnosis not present

## 2021-05-07 ENCOUNTER — Ambulatory Visit (HOSPITAL_COMMUNITY)
Admission: RE | Admit: 2021-05-07 | Discharge: 2021-05-07 | Disposition: A | Discharge status: Home / Self Care | Payer: Medicare PPO | Source: Ambulatory Visit | Attending: Family Medicine | Admitting: Family Medicine

## 2021-05-07 ENCOUNTER — Other Ambulatory Visit (HOSPITAL_COMMUNITY): Payer: Self-pay | Admitting: Family Medicine

## 2021-05-07 ENCOUNTER — Other Ambulatory Visit: Payer: Self-pay

## 2021-05-07 DIAGNOSIS — L910 Hypertrophic scar: Secondary | ICD-10-CM | POA: Diagnosis not present

## 2021-05-07 DIAGNOSIS — R946 Abnormal results of thyroid function studies: Secondary | ICD-10-CM

## 2021-05-07 DIAGNOSIS — E041 Nontoxic single thyroid nodule: Secondary | ICD-10-CM | POA: Diagnosis not present

## 2021-05-07 DIAGNOSIS — D485 Neoplasm of uncertain behavior of skin: Secondary | ICD-10-CM | POA: Diagnosis not present

## 2021-05-07 DIAGNOSIS — L309 Dermatitis, unspecified: Secondary | ICD-10-CM | POA: Diagnosis not present

## 2021-05-07 DIAGNOSIS — Z1231 Encounter for screening mammogram for malignant neoplasm of breast: Secondary | ICD-10-CM

## 2021-05-07 DIAGNOSIS — L570 Actinic keratosis: Secondary | ICD-10-CM | POA: Diagnosis not present

## 2021-05-16 ENCOUNTER — Ambulatory Visit: Payer: Medicare PPO | Admitting: Podiatry

## 2021-05-30 ENCOUNTER — Other Ambulatory Visit: Payer: Self-pay

## 2021-05-30 ENCOUNTER — Ambulatory Visit (INDEPENDENT_AMBULATORY_CARE_PROVIDER_SITE_OTHER): Payer: Medicare PPO | Admitting: Podiatry

## 2021-05-30 ENCOUNTER — Encounter: Payer: Self-pay | Admitting: Podiatry

## 2021-05-30 DIAGNOSIS — G5782 Other specified mononeuropathies of left lower limb: Secondary | ICD-10-CM

## 2021-05-30 DIAGNOSIS — G5762 Lesion of plantar nerve, left lower limb: Secondary | ICD-10-CM

## 2021-05-30 MED ORDER — TRIAMCINOLONE ACETONIDE 40 MG/ML IJ SUSP
20.0000 mg | Freq: Once | INTRAMUSCULAR | Status: AC
  Administered 2021-05-30: 20 mg

## 2021-06-01 NOTE — Progress Notes (Signed)
She presents today for follow-up of her neuroma third interdigital space left foot date of surgery for her tibialis anterior tendon repair was February 01, 2021 states that the scar looks a little thick but she is not overly concerned about that.  Objective: Vital signs stable alert and oriented x3 scar overlying the tibialis anterior is mildly hyperpigmented and mildly hypertrophic.  But she has great range of motion and no tenderness on palpation of the area.  She has good dorsiflexion and inversion against resistance.  Palpable neuroma third interdigital space of the left foot.  Plan: Discussed etiology pathology and surgical therapies I recommended silicone skin guard to help decrease the thickness also discussed other topical therapies Moderma bio oil cocoa butter etc.  I also injected the neuroma today 10 mg Kenalog 5 mg Marcaine point maximal tenderness.  Tolerated seizure well without complication.

## 2021-06-03 ENCOUNTER — Ambulatory Visit (HOSPITAL_COMMUNITY)
Admission: RE | Admit: 2021-06-03 | Discharge: 2021-06-03 | Disposition: A | Discharge status: Home / Self Care | Payer: Medicare PPO | Source: Ambulatory Visit | Attending: Family Medicine | Admitting: Family Medicine

## 2021-06-03 ENCOUNTER — Other Ambulatory Visit: Payer: Self-pay

## 2021-06-03 DIAGNOSIS — Z1231 Encounter for screening mammogram for malignant neoplasm of breast: Secondary | ICD-10-CM | POA: Diagnosis not present

## 2021-07-16 DIAGNOSIS — M810 Age-related osteoporosis without current pathological fracture: Secondary | ICD-10-CM | POA: Diagnosis not present

## 2021-08-14 DIAGNOSIS — D485 Neoplasm of uncertain behavior of skin: Secondary | ICD-10-CM | POA: Diagnosis not present

## 2021-08-14 DIAGNOSIS — D225 Melanocytic nevi of trunk: Secondary | ICD-10-CM | POA: Diagnosis not present

## 2021-08-14 DIAGNOSIS — L570 Actinic keratosis: Secondary | ICD-10-CM | POA: Diagnosis not present

## 2021-09-20 DIAGNOSIS — Z6830 Body mass index (BMI) 30.0-30.9, adult: Secondary | ICD-10-CM | POA: Diagnosis not present

## 2021-09-20 DIAGNOSIS — E559 Vitamin D deficiency, unspecified: Secondary | ICD-10-CM | POA: Diagnosis not present

## 2021-09-20 DIAGNOSIS — E6609 Other obesity due to excess calories: Secondary | ICD-10-CM | POA: Diagnosis not present

## 2021-09-20 DIAGNOSIS — E1140 Type 2 diabetes mellitus with diabetic neuropathy, unspecified: Secondary | ICD-10-CM | POA: Diagnosis not present

## 2021-09-20 DIAGNOSIS — K589 Irritable bowel syndrome without diarrhea: Secondary | ICD-10-CM | POA: Diagnosis not present

## 2021-09-20 DIAGNOSIS — E785 Hyperlipidemia, unspecified: Secondary | ICD-10-CM | POA: Diagnosis not present

## 2021-09-20 DIAGNOSIS — F329 Major depressive disorder, single episode, unspecified: Secondary | ICD-10-CM | POA: Diagnosis not present

## 2021-09-20 DIAGNOSIS — N951 Menopausal and female climacteric states: Secondary | ICD-10-CM | POA: Diagnosis not present

## 2021-09-20 DIAGNOSIS — I10 Essential (primary) hypertension: Secondary | ICD-10-CM | POA: Diagnosis not present

## 2021-10-16 DIAGNOSIS — E119 Type 2 diabetes mellitus without complications: Secondary | ICD-10-CM | POA: Diagnosis not present

## 2021-11-14 ENCOUNTER — Other Ambulatory Visit: Payer: Self-pay | Admitting: *Deleted

## 2021-11-14 ENCOUNTER — Encounter: Payer: Self-pay | Admitting: *Deleted

## 2021-11-14 ENCOUNTER — Ambulatory Visit: Payer: Self-pay | Admitting: *Deleted

## 2021-11-14 NOTE — Patient Outreach (Signed)
  Care Coordination   11/14/2021  Name: Mindy Ellis MRN: [id number] DOB: [birth year]   Care Coordination Outreach Attempts:  An unsuccessful telephone outreach was attempted today to offer the patient information about available care coordination services as a benefit of their health plan. HIPAA compliant messages left on voicemail, providing contact information for CSW, encouraging patient to return CSW's call at her earliest convenience.  Follow Up Plan:  Additional outreach attempts will be made to offer the patient care coordination information and services.   Encounter Outcome:  No Answer.   Care Coordination Interventions Activated:  No    Care Coordination Interventions:  No, not indicated.    Nat Christen, BSW, MSW, LCSW  Licensed Education officer, environmental [facility]  Mailing [place] [address] [place], [place] [address] Physical Address-[address] [place], [place] [address] Toll Free Main # [phone] Fax # [phone] Cell # [phone] [email or web address]

## 2021-11-14 NOTE — Patient Outreach (Signed)
  Care Coordination   Initial Visit Note   11/14/2021  Name: KYNDAL HERINGER MRN: [id number] DOB: 07/19/1950  CNIYAH SPROULL is a 71 y.o. year old female who sees Sharilyn Sites, MD for primary care. I spoke with  [facility] by phone today  What matters to the patients health and wellness today?  No Intervention Identified.  Collaboration with Primary Care Provider, Dr. Sharilyn Sites to Report, "Started Taking Cholesterol Medication and It's Going Good".   Goals Addressed   None     SDOH assessments and interventions completed:  Yes  SDOH Interventions Today    Flowsheet Row Most Recent Value  SDOH Interventions   Food Insecurity Interventions Intervention Not Indicated  Financial Strain Interventions Intervention Not Indicated  Housing Interventions Intervention Not Indicated  Physical Activity Interventions Intervention Not Indicated  Stress Interventions Intervention Not Indicated  Social Connections Interventions Intervention Not Indicated  Transportation Interventions Intervention Not Indicated        Care Coordination Interventions Activated:  Yes   Care Coordination Interventions:  Yes, provided.   Follow up plan: No further intervention required.   Encounter Outcome:  Pt. Visit Completed.   Nat Christen, BSW, MSW, LCSW  Licensed Education officer, environmental [facility]  Mailing [place] [address] [place], [place] [address] Physical Address[address], [place], [place] [address] Toll Free Main # [phone] Fax # [phone] Cell # [phone] [email or web address]

## 2021-11-14 NOTE — Patient Instructions (Signed)
Visit Information  Thank you for taking time to visit with me today. Please don't hesitate to contact me if I can be of assistance to you.   Please call the care guide team at [phone] if you need to cancel or reschedule your appointment.   If you are experiencing a Mental Health or Behavioral Health Crisis or need someone to talk to, please call the Suicide and Crisis Lifeline: 988 call the [place] National Suicide Prevention Lifeline: 1-800-273-8255 or TTY: 1-800-799-4 TTY (1-800-799-4889) to talk to a trained counselor call 1-800-273-TALK (toll free, 24 hour hotline) go to [facility] [address], [place] ([phone]) call the [place] Crisis Line: 800-939-9988 call 911  Patient verbalizes understanding of instructions and care plan provided today and agrees to view in MyChart. Active MyChart status and patient understanding of how to access instructions and care plan via MyChart confirmed with patient.     No further follow up required.  Boysie Bonebrake, BSW, MSW, LCSW  Licensed Clinical Social Worker  [facility]  Mailing Address-[address], [place], [place] [address] Physical Address-[address], [place], [place] [address] Toll Free Main # [phone] Fax # [phone] Cell # [phone] [email or web address]            

## 2021-11-19 ENCOUNTER — Ambulatory Visit (INDEPENDENT_AMBULATORY_CARE_PROVIDER_SITE_OTHER): Payer: Medicare PPO | Admitting: Podiatry

## 2021-11-19 ENCOUNTER — Encounter: Payer: Self-pay | Admitting: Podiatry

## 2021-11-19 DIAGNOSIS — G5782 Other specified mononeuropathies of left lower limb: Secondary | ICD-10-CM

## 2021-11-19 DIAGNOSIS — M7989 Other specified soft tissue disorders: Secondary | ICD-10-CM

## 2021-11-19 DIAGNOSIS — M66872 Spontaneous rupture of other tendons, left ankle and foot: Secondary | ICD-10-CM

## 2021-11-19 DIAGNOSIS — Z9889 Other specified postprocedural states: Secondary | ICD-10-CM

## 2021-11-19 DIAGNOSIS — M778 Other enthesopathies, not elsewhere classified: Secondary | ICD-10-CM

## 2021-11-19 MED ORDER — TRIAMCINOLONE ACETONIDE 40 MG/ML IJ SUSP
20.0000 mg | Freq: Once | INTRAMUSCULAR | Status: AC
  Administered 2021-11-19: 20 mg

## 2021-11-19 NOTE — Progress Notes (Signed)
She presents today complaining of still tightness and tenderness of the anterior medial ankle left secondary to previous tibialis anterior tendon repair back in 2022.  She also complaining of a neuroma third interspace left foot states that it is still little bit tender.  Objective: Vital signs stable alert oriented x3 she has slight rash overlying the third interdigital space of the left foot she states that it was sunburn.  She does have tenderness over the distalmost aspect of the tibialis anterior tendon at its insertion site.  Assessment painful scar tissue and tibialis anterior insertional tendinitis.  She also has neuroma third interdigital space left foot.  Plan: Going to refer her to [facility] therapy in [place] to help break up any scar tissue and strengthen tibialis anterior tendon so she will walk more normally.  Also injected her first dose of dehydrated alcohol into the third interdigital space of the left foot.  Follow-up with her after PT.

## 2021-12-11 DIAGNOSIS — E6609 Other obesity due to excess calories: Secondary | ICD-10-CM | POA: Diagnosis not present

## 2021-12-11 DIAGNOSIS — Z1331 Encounter for screening for depression: Secondary | ICD-10-CM | POA: Diagnosis not present

## 2021-12-11 DIAGNOSIS — Z6830 Body mass index (BMI) 30.0-30.9, adult: Secondary | ICD-10-CM | POA: Diagnosis not present

## 2021-12-11 DIAGNOSIS — Z0001 Encounter for general adult medical examination with abnormal findings: Secondary | ICD-10-CM | POA: Diagnosis not present

## 2021-12-17 ENCOUNTER — Ambulatory Visit (HOSPITAL_COMMUNITY): Payer: Medicare PPO | Attending: Podiatry | Admitting: Physical Therapy

## 2021-12-17 ENCOUNTER — Encounter (HOSPITAL_COMMUNITY): Payer: Self-pay | Admitting: Physical Therapy

## 2021-12-17 DIAGNOSIS — M66872 Spontaneous rupture of other tendons, left ankle and foot: Secondary | ICD-10-CM | POA: Diagnosis not present

## 2021-12-17 DIAGNOSIS — M7989 Other specified soft tissue disorders: Secondary | ICD-10-CM | POA: Diagnosis not present

## 2021-12-17 DIAGNOSIS — R262 Difficulty in walking, not elsewhere classified: Secondary | ICD-10-CM | POA: Insufficient documentation

## 2021-12-17 DIAGNOSIS — M25572 Pain in left ankle and joints of left foot: Secondary | ICD-10-CM | POA: Insufficient documentation

## 2021-12-17 DIAGNOSIS — R2689 Other abnormalities of gait and mobility: Secondary | ICD-10-CM | POA: Diagnosis not present

## 2021-12-17 DIAGNOSIS — Z9889 Other specified postprocedural states: Secondary | ICD-10-CM | POA: Insufficient documentation

## 2021-12-17 NOTE — Therapy (Signed)
OUTPATIENT PHYSICAL THERAPY LOWER EXTREMITY EVALUATION   Patient Name: Mindy Ellis MRN: [id number] DOB:Jun 11, 1950, 71 y.o., female Today's Date: 12/17/2021   PT End of Session - 12/17/21 1246     Visit Number 1    Number of Visits 12    Date for PT Re-Evaluation 01/28/22    Authorization Type [employer] Medicare Choice (secondary CHAMPVA)    Authorization Time Period auth req (no visit limit)    Progress Note Due on Visit 10    PT Start Time 1259    PT Stop Time 1348    PT Time Calculation (min) 49 min    Activity Tolerance Patient tolerated treatment well    Behavior During Therapy WFL for tasks assessed/performed             Past Medical History:  Diagnosis Date   Abdominal pain    Acid reflux    Chronic diarrhea    DDD (degenerative disc disease)    Depression    Diabetes mellitus without complication (HCC)    diet controlled   Dysphagia    Fibromyalgia    GERD (gastroesophageal reflux disease)    Hypertension    Meningioma (HCC)    PONV (postoperative nausea and vomiting)    nausea, pt must not vomit due to reflux surgery   Past Surgical History:  Procedure Laterality Date   ABDOMINAL HYSTERECTOMY  age 59   ANTERIOR AND POSTERIOR REPAIR N/A 01/18/2013   Procedure: ANTERIOR (CYSTOCELE) ;  Surgeon: Reece Packer, MD;  Location: [facility];  Service: Urology;  Laterality: N/A;   APPENDECTOMY  age 64   both ovaries removed   BREAST SURGERY Left 21 yrs ago   benign lump removed   CHOLECYSTECTOMY     COLONOSCOPY  11/11/07   NUR   CYSTOSCOPY N/A 01/18/2013   Procedure: CYSTOSCOPY;  Surgeon: Reece Packer, MD;  Location: [facility];  Service: Urology;  Laterality: N/A;   EYE SURGERY     lasix eye surgery Bilateral    PROCTOSCOPY  01/18/2013   Procedure: PROCTOSCOPY;  Surgeon: Reece Packer, MD;  Location: [facility];  Service: Urology;;   surgery  for acid reflux  1999   TONSILLECTOMY     TRANSFORAMINAL LUMBAR INTERBODY FUSION (TLIF) WITH PEDICLE SCREW  FIXATION 1 LEVEL Left 04/04/2020   Procedure: Left Lumbar four-five Transforaminal lumbar interbody fusion;  Surgeon: Erline Levine, MD;  Location: [facility];  Service: Neurosurgery;  Laterality: Left;   UPPER GASTROINTESTINAL ENDOSCOPY  2006   Patient Active Problem List   Diagnosis Date Noted   Primary osteoarthritis of right hip 07/16/2020   Acute right hip pain 06/27/2020   Elevated blood-pressure reading, without diagnosis of hypertension 04/25/2020   Spondylolisthesis of lumbar region 04/04/2020   Chronic bilateral low back pain with left-sided sciatica 03/05/2020   Herniated nucleus pulposus, lumbar 03/05/2020   Other chronic pain 03/05/2020   Benign meningioma ([place]) 02/14/2019   Body mass index (BMI) 29.0-29.9, adult 02/14/2019   Dyspareunia in female 01/04/2019   Cerebral meningioma ([place]) 08/10/2013   Unspecified hereditary and idiopathic peripheral neuropathy 07/07/2013   Memory changes 07/07/2013   Type II or unspecified type diabetes mellitus without mention of complication, not stated as uncontrolled 07/07/2013   Unspecified essential hypertension 07/07/2013    PCP: Sharilyn Sites, MD  REFERRING PROVIDER: Tyson Dense, DPM  REFERRING DIAG: [phone] (ICD-10-CM) - Nontraumatic tear of left tibialis posterior tendon M79.89 (ICD-10-CM) - Mass of soft tissue of foot Z98.890 (ICD-10-CM) - Status  post left foot surgery   THERAPY DIAG:  Pain in left ankle and joints of left foot  Other abnormalities of gait and mobility  Difficulty in walking, not elsewhere classified  Rationale for Evaluation and Treatment Rehabilitation  ONSET DATE: Oct 2022  SUBJECTIVE:   SUBJECTIVE STATEMENT: Pt reports she sprained her left ankle in 2021.Underwent left ankle surgery in October of 2022 to repair torn tendons. States the ankle has been very stiff ever since. States the pain causes to her walk funny and causes pain. Feels there is some weakness. Only walking short distances.  PERTINENT  HISTORY: Reports hx of lumbar fusion  PAIN:  Are you having pain? Yes: NPRS scale: 8/10 Pain location: left medial ankle Pain description: stiffness Aggravating factors: walking, stairs Relieving factors: rest  PRECAUTIONS: None  WEIGHT BEARING RESTRICTIONS No  FALLS:  Has patient fallen in last 6 months? No  LIVING ENVIRONMENT: Lives with: lives with their spouse Lives in: House/apartment Stairs: Yes: External: 1 steps; none Has following equipment at home: None  OCCUPATION: Retired  PLOF: Independent  PATIENT GOALS Reduce pain, walk at least 30 minutes for exercise because of back issues, navigate stairs confidently.    OBJECTIVE:   DIAGNOSTIC FINDINGS:  IMPRESSION 1. No internal derangement of the left ankle. 2. Prominence of the subcutaneous fat along the anteromedial aspect of the ankle at the site of clinical palpable abnormality without a defined encapsulated mass or fluid collection.  PATIENT SURVEYS:  FOTO 43%  COGNITION:  Overall cognitive status: Within functional limits for tasks assessed     SENSATION: WFL  LOWER EXTREMITY MMT:   MMT Right eval Left eval  Hip flexion 5 4  Hip extension    Hip abduction 5 5  Hip adduction    Hip internal rotation    Hip external rotation    Knee flexion 4+ 4+  Knee extension 5 5  Ankle dorsiflexion 5 5  Ankle plantarflexion    Ankle inversion 4+ 4  Ankle eversion 5 4   (Blank rows = not tested)  LOWER EXTREMITY ROM:   Active ROM Right eval Left eval  Hip flexion    Hip extension    Hip abduction    Hip adduction    Hip internal rotation    Hip external rotation    Knee flexion    Knee extension    Ankle dorsiflexion 11 knee ext 5 knee flex  -5 knee ext 1 knee flex  Ankle plantarflexion 30 30  Ankle inversion 14 11  Ankle eversion 5 4   (Blank rows = not tested)  LOWER EXTREMITY SPECIAL TESTS:  No overt laxity noted. Restricted talocrural motion with AP and PA glides and mildly  painful.  FUNCTIONAL TESTS:  5 times sit to stand: 11.5 sec 2 minute walk test: 356 feet no AD  GAIT: Distance walked: 356' Assistive device utilized: None Level of assistance: Complete Independence Comments: rapid forefoot slap at initial heel strike, narrow BOS, intermittent stagger towards left, early heel off at terminal stance    TODAY'S TREATMENT: Eval MMT, ROM 2MWT, 5x sit to stand Provided HEP Pt. education   PATIENT EDUCATION:  Education details: Findings, HEP, Role of PT, symptom awareness and activity modifications Person educated: Patient Education method: Explanation Education comprehension: verbalized understanding   HOME EXERCISE PROGRAM: Access Code: [email or web address] URL: [email or web address] Date: 12/17/2021 Prepared by: Candie Mile  Exercises - Long Sitting Ankle Eversion with Resistance  - 3 x daily - 7 x weekly - 1 sets -  10 reps - Long Sitting Ankle Inversion with Resistance  - 3 x daily - 7 x weekly - 1 sets - 10 reps - Seated Ankle Dorsiflexion with Resistance  - 3 x daily - 7 x weekly - 1 sets - 10 reps - Gastroc Stretch on Wall  - 3 x daily - 7 x weekly - 1 sets - 30 sec hold - Standing Ankle Dorsiflexion Stretch on Chair  - 3 x daily - 7 x weekly - 1 sets - 10 reps - 3 sec hold hold  ASSESSMENT:  CLINICAL IMPRESSION: Patient is a 71 y.o. female who was seen today for physical therapy evaluation and treatment for left ankle pain.  Patient presents with deficits including reduced strength, limited range of motion, decreased endurance, limited activity tolerance, abnormalities of gait, impaired balance, and pain, contributing to impaired functional mobility with ADLs and IADLs. Patient is currently restricted in ADLs as indicated by objective and subjective functional outcome measures, as well as reported history and objective measures taken during this exam. Patient will benefit from skilled physical therapy intervention in order to  improve function and reduce the impairments listed above.    OBJECTIVE IMPAIRMENTS Abnormal gait, decreased activity tolerance, decreased balance, decreased endurance, decreased knowledge of condition, decreased mobility, difficulty walking, decreased ROM, decreased strength, hypomobility, increased fascial restrictions, impaired flexibility, and pain.   ACTIVITY LIMITATIONS carrying, standing, stairs, and locomotion level  PARTICIPATION LIMITATIONS: cleaning, laundry, shopping, community activity, and yard work  PERSONAL FACTORS Age, Fitness, and Time since onset of injury/illness/exacerbation are also affecting patient's functional outcome.   REHAB POTENTIAL: Excellent  CLINICAL DECISION MAKING: Stable/uncomplicated  EVALUATION COMPLEXITY: Low   GOALS: Goals reviewed with patient? Yes  SHORT TERM GOALS: Target date: 01/07/22   Patient will be independent with initial HEP and self-management strategies to improve functional outcomes Baseline: initiated Goal status: INITIAL    LONG TERM GOALS: Target date: 01/28/22  Patient will be independent with advanced HEP and self-management strategies to improve functional outcomes Baseline: n/a Goal status: INITIAL  2.  Patient will improve FOTO score to predicted value of 58% to indicate improvement in functional outcomes Baseline: 43% Goal status: INITIAL  3.  Patient will have Lt ankle AROM  dorsiflexion within 5 degrees of non-affected ankle to improve functional mobility and facilitate improved gait and stair navigation. Baseline: see above Goal status: INITIAL  4. Patient will have equal to or > 4+/5 MMT throughout Bil LEs to improve ability to perform functional mobility, stair ambulation and ADLs.  Baseline: see above Goal status: INITIAL  5. Patient will be able to ambulate at least 400 feet during 2MWT with LRAD to demonstrate improved ability to perform functional mobility and associated tasks. Baseline: 356 feet Goal  status: INITIAL    PLAN: PT FREQUENCY: 2x/week  PT DURATION: 6 weeks  PLANNED INTERVENTIONS: Therapeutic exercises, Therapeutic activity, Neuromuscular re-education, Balance training, Gait training, Patient/Family education, Self Care, Joint mobilization, Joint manipulation, Stair training, DME instructions, Dry Needling, Cryotherapy, Moist heat, Taping, Ultrasound, Ionotophoresis '4mg'$ /ml Dexamethasone, Manual therapy, and Re-evaluation  PLAN FOR NEXT SESSION: left ankle ROM, manual work as tolerated, gradual strengthening of ankle mm as range improves. Balance, gait, and stair training to improve mechanics.  Candie Mile, PT, DPT Physical Therapist Acute Rehabilitation Services [facility]  12/17/2021, 2:19 PM

## 2021-12-19 ENCOUNTER — Ambulatory Visit (HOSPITAL_COMMUNITY): Payer: Medicare PPO | Admitting: Physical Therapy

## 2021-12-19 ENCOUNTER — Encounter (HOSPITAL_COMMUNITY): Payer: Self-pay | Admitting: Physical Therapy

## 2021-12-19 DIAGNOSIS — M66872 Spontaneous rupture of other tendons, left ankle and foot: Secondary | ICD-10-CM | POA: Diagnosis not present

## 2021-12-19 DIAGNOSIS — R262 Difficulty in walking, not elsewhere classified: Secondary | ICD-10-CM | POA: Diagnosis not present

## 2021-12-19 DIAGNOSIS — R2689 Other abnormalities of gait and mobility: Secondary | ICD-10-CM | POA: Diagnosis not present

## 2021-12-19 DIAGNOSIS — Z9889 Other specified postprocedural states: Secondary | ICD-10-CM | POA: Diagnosis not present

## 2021-12-19 DIAGNOSIS — M7989 Other specified soft tissue disorders: Secondary | ICD-10-CM | POA: Diagnosis not present

## 2021-12-19 DIAGNOSIS — M25572 Pain in left ankle and joints of left foot: Secondary | ICD-10-CM

## 2021-12-19 NOTE — Therapy (Signed)
OUTPATIENT PHYSICAL THERAPY LOWER EXTREMITY TREATMENT   Patient Name: Mindy Ellis MRN: [id number] DOB:[birth year], 71 y.o., female Today's Date: 12/19/2021   PT End of Session - 12/19/21 1034     Visit Number 2    Number of Visits 12    Date for PT Re-Evaluation 01/28/22    Authorization Type [employer] Medicare Choice (secondary CHAM[employer]A)    Authorization Time Period auth req (no visit limit)    Progress Note Due on Visit 10    PT Start Time 1031    PT Stop Time 1116    PT Time Calculation (min) 45 min    Activity Tolerance Patient tolerated treatment well    Behavior During Therapy WFL for tasks assessed/performed             Past Medical History:  Diagnosis Date   Abdominal pain    Acid reflux    Chronic diarrhea    DDD (degenerative disc disease)    Depression    Diabetes mellitus without complication (HCC)    diet controlled   Dysphagia    Fibromyalgia    GERD (gastroesophageal reflux disease)    Hypertension    Meningioma (HCC)    PONV (postoperative nausea and vomiting)    nausea, pt must not vomit due to reflux surgery   Past Surgical History:  Procedure Laterality Date   ABDOMINAL HYSTERECTOMY  age 45   ANTERIOR AND POSTERIOR REPAIR N/A 01/18/2013   Procedure: ANTERIOR (CYSTOCELE) ;  Surgeon: Reece Packer, MD;  [facility];  Service: Urology;  Laterality: N/A;   APPENDECTOMY  age 11   both ovaries removed   BREAST SURGERY Left 21 yrs ago   benign lump removed   CHOLECYSTECTOMY     COLONOSCOPY  11/11/07   NUR   CYSTOSCOPY N/A 01/18/2013   Procedure: CYSTOSCOPY;  Surgeon: Reece Packer, MD;  [facility];  Service: Urology;  Laterality: N/A;   EYE SURGERY     lasix eye surgery Bilateral    PROCTOSCOPY  01/18/2013   Procedure: PROCTOSCOPY;  Surgeon: Reece Packer, MD;  Location[facility];  Service: Urology;;   surgery  for acid reflux  1999   TONSILLECTOMY     TRANSFORAMINAL LUMBAR INTERBODY FUSION (TLIF) WITH PEDICLE SCREW  FIXATION 1 LEVEL Left 04/04/2020   Procedure: Left Lumbar four-five Transforaminal lumbar interbody fusion;  Surgeon: Erline Levine, MD;  Location: [facility];  Service: Neurosurgery;  Laterality: Left;   UPPER GASTROINTESTINAL ENDOSCOPY  2006   Patient Active Problem List   Diagnosis Date Noted   Primary osteoarthritis of right hip 07/16/2020   Acute right hip pain 06/27/2020   Elevated blood-pressure reading, without diagnosis of hypertension 04/25/2020   Spondylolisthesis of lumbar region 04/04/2020   Chronic bilateral low back pain with left-sided sciatica 03/05/2020   Herniated nucleus pulposus, lumbar 03/05/2020   Other chronic pain 03/05/2020   Benign meningioma ([place]) 02/14/2019   Body mass index (BMI) 29.0-29.9, adult 02/14/2019   Dyspareunia in female 01/04/2019   Cerebral meningioma ([place]) 08/10/2013   Unspecified hereditary and idiopathic peripheral neuropathy 07/07/2013   Memory changes 07/07/2013   Type II or unspecified type diabetes mellitus without mention of complication, not stated as uncontrolled 07/07/2013   Unspecified essential hypertension 07/07/2013    PCP: Sharilyn Sites, MD  REFERRING PROVIDER: Tyson Dense, DPM  REFERRING DIAG: [id number] (ICD-10-CM) - Nontraumatic tear of left tibialis posterior tendon M79.89 (ICD-10-CM) - Mass of soft tissue of foot Z98.890 (ICD-10-CM) - Status  post left foot surgery   THERAPY DIAG:  Pain in left ankle and joints of left foot  Difficulty in walking, not elsewhere classified  Other abnormalities of gait and mobility  Rationale for Evaluation and Treatment Rehabilitation  ONSET DATE: Oct 2022  SUBJECTIVE:   SUBJECTIVE STATEMENT: Pt has questions about HEP technique. Reports no overt soreness from performing regularly at home.  PERTINENT HISTORY: Reports hx of lumbar fusion  PAIN:  Are you having pain? Yes: NPRS scale: 2/10 Pain location: left medial ankle Pain description: stiffness Aggravating factors: walking,  stairs Relieving factors: rest  PRECAUTIONS: None  WEIGHT BEARING RESTRICTIONS No  FALLS:  Has patient fallen in last 6 months? No  LIVING ENVIRONMENT: Lives with: lives with their spouse Lives in: House/apartment Stairs: Yes: External: 1 steps; none Has following equipment at home: None  OCCUPATION: Retired  PLOF: Independent  PATIENT GOALS Reduce pain, walk at least 30 minutes for exercise because of back issues, navigate stairs confidently.    OBJECTIVE:   DIAGNOSTIC FINDINGS:  IMPRESSION 1. No internal derangement of the left ankle. 2. Prominence of the subcutaneous fat along the anteromedial aspect of the ankle at the site of clinical palpable abnormality without a defined encapsulated mass or fluid collection.  PATIENT SURVEYS:  FOTO 43%  COGNITION:  Overall cognitive status: Within functional limits for tasks assessed     SENSATION: WFL  LOWER EXTREMITY MMT:   MMT Right eval Left eval  Hip flexion 5 4  Hip extension    Hip abduction 5 5  Hip adduction    Hip internal rotation    Hip external rotation    Knee flexion 4+ 4+  Knee extension 5 5  Ankle dorsiflexion 5 5  Ankle plantarflexion    Ankle inversion 4+ 4  Ankle eversion 5 4   (Blank rows = not tested)  LOWER EXTREMITY ROM:   Active ROM Right eval Left eval  Hip flexion    Hip extension    Hip abduction    Hip adduction    Hip internal rotation    Hip external rotation    Knee flexion    Knee extension    Ankle dorsiflexion 11 knee ext 5 knee flex  -5 knee ext 1 knee flex  Ankle plantarflexion 30 30  Ankle inversion 14 11  Ankle eversion 5 4   (Blank rows = not tested)  LOWER EXTREMITY SPECIAL TESTS:  No overt laxity noted. Restricted talocrural motion with AP and PA glides and mildly painful.  FUNCTIONAL TESTS:  5 times sit to stand: 11.5 sec 2 minute walk test: 356 feet no AD  GAIT: Distance walked: 356' Assistive device utilized: None Level of assistance:  Complete Independence Comments: rapid forefoot slap at initial heel strike, narrow BOS, intermittent stagger towards left, early heel off at terminal stance    TODAY'S TREATMENT: 12/19/21 Reviewed HEP Manual Therapy - left ankle long axis distraction with dorsiflexion bias 3'. AP talocrural mobilization grade III-IV 15 oscillations x 10 Seated:    GTB ankle dorsiflexion 2x10  GTB ankle eversion 2x10  GTB ankle inversion 2x10 Standing:  Gastroc stretch 2x30"  Soleus stretch 2x30"  Ankle dorsiflexion stretch on chair 10 x 3" hold (posterior glide manually applied to distal tib/fib joint)    Ankle dorsiflexion leaning on wall 2x10  Heel raise 2x10  Eval MMT, ROM 2MWT, 5x sit to stand Provided HEP Pt. education   PATIENT EDUCATION:  Education details: Findings, HEP, Role of PT, symptom awareness and  activity modifications Person educated: Patient Education method: Explanation Education comprehension: verbalized understanding   HOME EXERCISE PROGRAM: Access Code: [email or web address]  12/19/21 - Standing Tandem Balance with Counter Support (Mirrored)  - 3 x daily - 7 x weekly - 2 sets - 20 seconds hold   URL: [email or web address] Date: 12/17/2021 Prepared by: Candie Mile  Exercises - Long Sitting Ankle Eversion with Resistance  - 3 x daily - 7 x weekly - 1 sets - 10 reps - Long Sitting Ankle Inversion with Resistance  - 3 x daily - 7 x weekly - 1 sets - 10 reps - Seated Ankle Dorsiflexion with Resistance  - 3 x daily - 7 x weekly - 1 sets - 10 reps - Gastroc Stretch on Wall  - 3 x daily - 7 x weekly - 1 sets - 30 sec hold - Standing Ankle Dorsiflexion Stretch on Chair  - 3 x daily - 7 x weekly - 1 sets - 10 reps - 3 sec hold hold  ASSESSMENT:  CLINICAL IMPRESSION: Improved ROM following manual therapy, and followed up with loaded ankle dorsiflexion with foot on table. Pt without increased pain throughout session. Updated HEP for  proprioception/balance.   OBJECTIVE IMPAIRMENTS Abnormal gait, decreased activity tolerance, decreased balance, decreased endurance, decreased knowledge of condition, decreased mobility, difficulty walking, decreased ROM, decreased strength, hypomobility, increased fascial restrictions, impaired flexibility, and pain.   ACTIVITY LIMITATIONS carrying, standing, stairs, and locomotion level  PARTICIPATION LIMITATIONS: cleaning, laundry, shopping, community activity, and yard work  PERSONAL FACTORS Age, Fitness, and Time since onset of injury/illness/exacerbation are also affecting patient's functional outcome.   REHAB POTENTIAL: Excellent  CLINICAL DECISION MAKING: Stable/uncomplicated  EVALUATION COMPLEXITY: Low   GOALS: Goals reviewed with patient? Yes  SHORT TERM GOALS: Target date: 01/07/22   Patient will be independent with initial HEP and self-management strategies to improve functional outcomes Baseline: initiated Goal status: INITIAL    LONG TERM GOALS: Target date: 01/28/22  Patient will be independent with advanced HEP and self-management strategies to improve functional outcomes Baseline: n/a Goal status: INITIAL  2.  Patient will improve FOTO score to predicted value of 58% to indicate improvement in functional outcomes Baseline: 43% Goal status: INITIAL  3.  Patient will have Lt ankle AROM  dorsiflexion within 5 degrees of non-affected ankle to improve functional mobility and facilitate improved gait and stair navigation. Baseline: see above Goal status: INITIAL  4. Patient will have equal to or > 4+/5 MMT throughout Bil LEs to improve ability to perform functional mobility, stair ambulation and ADLs.  Baseline: see above Goal status: INITIAL  5. Patient will be able to ambulate at least 400 feet during 2MWT with LRAD to demonstrate improved ability to perform functional mobility and associated tasks. Baseline: 356 feet Goal status: INITIAL    PLAN: PT  FREQUENCY: 2x/week  PT DURATION: 6 weeks  PLANNED INTERVENTIONS: Therapeutic exercises, Therapeutic activity, Neuromuscular re-education, Balance training, Gait training, Patient/Family education, Self Care, Joint mobilization, Joint manipulation, Stair training, DME instructions, Dry Needling, Cryotherapy, Moist heat, Taping, Ultrasound, Ionotophoresis '4mg'$ /ml Dexamethasone, Manual therapy, and Re-evaluation  PLAN FOR NEXT SESSION: Manual therapy - left ankle ROM, gradual strengthening of ankle mm as range improves. Balance, gait, and stair training to improve mechanics.  Candie Mile, PT, DPT Physical Therapist Acute Rehabilitation Services [facility]  12/19/2021, 11:22 AM

## 2021-12-23 ENCOUNTER — Ambulatory Visit (HOSPITAL_COMMUNITY): Payer: Medicare PPO

## 2021-12-23 DIAGNOSIS — M25572 Pain in left ankle and joints of left foot: Secondary | ICD-10-CM | POA: Diagnosis not present

## 2021-12-23 DIAGNOSIS — M7989 Other specified soft tissue disorders: Secondary | ICD-10-CM | POA: Diagnosis not present

## 2021-12-23 DIAGNOSIS — Z9889 Other specified postprocedural states: Secondary | ICD-10-CM | POA: Diagnosis not present

## 2021-12-23 DIAGNOSIS — R262 Difficulty in walking, not elsewhere classified: Secondary | ICD-10-CM

## 2021-12-23 DIAGNOSIS — R2689 Other abnormalities of gait and mobility: Secondary | ICD-10-CM | POA: Diagnosis not present

## 2021-12-23 DIAGNOSIS — M66872 Spontaneous rupture of other tendons, left ankle and foot: Secondary | ICD-10-CM | POA: Diagnosis not present

## 2021-12-23 NOTE — Therapy (Signed)
OUTPATIENT PHYSICAL THERAPY LOWER EXTREMITY TREATMENT   Patient Name: Mindy Ellis MRN: [id number] DOB:18-Jan-1951, 71 y.o., female Today's Date: 12/23/2021   PT End of Session - 12/23/21 1519     Visit Number 3    Number of Visits 12    Date for PT Re-Evaluation 01/28/22    Authorization Type [employer] Medicare Choice (secondary [employer])    Authorization Time Period Approved 12 visits 9/12-10/24    Authorization - Visit Number 2    Authorization - Number of Visits 12    Progress Note Due on Visit 10    PT Start Time 1518    PT Stop Time 1600    PT Time Calculation (min) 42 min    Activity Tolerance Patient tolerated treatment well    Behavior During Therapy WFL for tasks assessed/performed              Past Medical History:  Diagnosis Date   Abdominal pain    Acid reflux    Chronic diarrhea    DDD (degenerative disc disease)    Depression    Diabetes mellitus without complication (HCC)    diet controlled   Dysphagia    Fibromyalgia    GERD (gastroesophageal reflux disease)    Hypertension    Meningioma (HCC)    PONV (postoperative nausea and vomiting)    nausea, pt must not vomit due to reflux surgery   Past Surgical History:  Procedure Laterality Date   ABDOMINAL HYSTERECTOMY  age 21   ANTERIOR AND POSTERIOR REPAIR N/A 01/18/2013   Procedure: ANTERIOR (CYSTOCELE) ;  Surgeon: Reece Packer, MD;  Location: [facility];  Service: Urology;  Laterality: N/A;   APPENDECTOMY  age 15   both ovaries removed   BREAST SURGERY Left 21 yrs ago   benign lump removed   CHOLECYSTECTOMY     COLONOSCOPY  11/11/07   NUR   CYSTOSCOPY N/A 01/18/2013   Procedure: CYSTOSCOPY;  Surgeon: Reece Packer, MD;  Location: [facility];  Service: Urology;  Laterality: N/A;   EYE SURGERY     lasix eye surgery Bilateral    PROCTOSCOPY  01/18/2013   Procedure: PROCTOSCOPY;  Surgeon: Reece Packer, MD;  Location: [facility];  Service: Urology;;   surgery  for acid reflux  1999    TONSILLECTOMY     TRANSFORAMINAL LUMBAR INTERBODY FUSION (TLIF) WITH PEDICLE SCREW FIXATION 1 LEVEL Left 04/04/2020   Procedure: Left Lumbar four-five Transforaminal lumbar interbody fusion;  Surgeon: Erline Levine, MD;  Location: [facility];  Service: Neurosurgery;  Laterality: Left;   UPPER GASTROINTESTINAL ENDOSCOPY  2006   Patient Active Problem List   Diagnosis Date Noted   Primary osteoarthritis of right hip 07/16/2020   Acute right hip pain 06/27/2020   Elevated blood-pressure reading, without diagnosis of hypertension 04/25/2020   Spondylolisthesis of lumbar region 04/04/2020   Chronic bilateral low back pain with left-sided sciatica 03/05/2020   Herniated nucleus pulposus, lumbar 03/05/2020   Other chronic pain 03/05/2020   Benign meningioma ([place]) 02/14/2019   Body mass index (BMI) 29.0-29.9, adult 02/14/2019   Dyspareunia in female 01/04/2019   Cerebral meningioma ([place]) 08/10/2013   Unspecified hereditary and idiopathic peripheral neuropathy 07/07/2013   Memory changes 07/07/2013   Type II or unspecified type diabetes mellitus without mention of complication, not stated as uncontrolled 07/07/2013   Unspecified essential hypertension 07/07/2013    PCP: Sharilyn Sites, MD  REFERRING PROVIDER: Tyson Dense, DPM  REFERRING DIAG: [phone] (ICD-10-CM) - Nontraumatic tear of  left tibialis posterior tendon M79.89 (ICD-10-CM) - Mass of soft tissue of foot Z98.890 (ICD-10-CM) - Status post left foot surgery   THERAPY DIAG:  Pain in left ankle and joints of left foot  Difficulty in walking, not elsewhere classified  Other abnormalities of gait and mobility  Rationale for Evaluation and Treatment Rehabilitation  ONSET DATE: Oct 2022  SUBJECTIVE:   SUBJECTIVE STATEMENT: "Wore my hey dudes this weekend to a soccer game and my toes hurt me the rest of the day and night"; stiff today  PERTINENT HISTORY: Reports hx of lumbar fusion  PAIN:  Are you having pain? Yes: NPRS scale:  2/10 Pain location: left medial ankle Pain description: stiffness Aggravating factors: walking, stairs Relieving factors: rest  PRECAUTIONS: None  WEIGHT BEARING RESTRICTIONS No  FALLS:  Has patient fallen in last 6 months? No  LIVING ENVIRONMENT: Lives with: lives with their spouse Lives in: House/apartment Stairs: Yes: External: 1 steps; none Has following equipment at home: None  OCCUPATION: Retired  PLOF: Independent  PATIENT GOALS Reduce pain, walk at least 30 minutes for exercise because of back issues, navigate stairs confidently.    OBJECTIVE:   DIAGNOSTIC FINDINGS:  IMPRESSION 1. No internal derangement of the left ankle. 2. Prominence of the subcutaneous fat along the anteromedial aspect of the ankle at the site of clinical palpable abnormality without a defined encapsulated mass or fluid collection.  PATIENT SURVEYS:  FOTO 43%  COGNITION:  Overall cognitive status: Within functional limits for tasks assessed     SENSATION: WFL  LOWER EXTREMITY MMT:   MMT Right eval Left eval  Hip flexion 5 4  Hip extension    Hip abduction 5 5  Hip adduction    Hip internal rotation    Hip external rotation    Knee flexion 4+ 4+  Knee extension 5 5  Ankle dorsiflexion 5 5  Ankle plantarflexion    Ankle inversion 4+ 4  Ankle eversion 5 4   (Blank rows = not tested)  LOWER EXTREMITY ROM:   Active ROM Right eval Left eval  Hip flexion    Hip extension    Hip abduction    Hip adduction    Hip internal rotation    Hip external rotation    Knee flexion    Knee extension    Ankle dorsiflexion 11 knee ext 5 knee flex  -5 knee ext 1 knee flex  Ankle plantarflexion 30 30  Ankle inversion 14 11  Ankle eversion 5 4   (Blank rows = not tested)  LOWER EXTREMITY SPECIAL TESTS:  No overt laxity noted. Restricted talocrural motion with AP and PA glides and mildly painful.  FUNCTIONAL TESTS:  5 times sit to stand: 11.5 sec 2 minute walk test: 356  feet no AD  GAIT: Distance walked: 356' Assistive device utilized: None Level of assistance: Complete Independence Comments: rapid forefoot slap at initial heel strike, narrow BOS, intermittent stagger towards left, early heel off at terminal stance    TODAY'S TREATMENT: 12/23/21 STM and manual stretching x 10' to decrease pain and improve tissue mobility  Seated: Heel slides x 2' Marble pick up x 1 round Heel/toe raises x 20  Standing: Slant board 5 x 20" 12" box knee drives for soleus stretch x 10  Gait training down hallway with emphasis on improved heel strike and controlled eccentric dorsiflexion       12/19/21 Reviewed HEP Manual Therapy - left ankle long axis distraction with dorsiflexion bias 3'. AP talocrural  mobilization grade III-IV 15 oscillations x 10 Seated:    GTB ankle dorsiflexion 2x10  GTB ankle eversion 2x10  GTB ankle inversion 2x10 Standing:  Gastroc stretch 2x30"  Soleus stretch 2x30"  Ankle dorsiflexion stretch on chair 10 x 3" hold (posterior glide manually applied to distal tib/fib joint)    Ankle dorsiflexion leaning on wall 2x10  Heel raise 2x10  Eval MMT, ROM 2MWT, 5x sit to stand Provided HEP Pt. education   PATIENT EDUCATION:  Education details: Findings, HEP, Role of PT, symptom awareness and activity modifications Person educated: Patient Education method: Explanation Education comprehension: verbalized understanding   HOME EXERCISE PROGRAM: Access Code: [email or web address]  12/19/21 - Standing Tandem Balance with Counter Support (Mirrored)  - 3 x daily - 7 x weekly - 2 sets - 20 seconds hold   URL: [email or web address] Date: 12/17/2021 Prepared by: Candie Mile  Exercises - Long Sitting Ankle Eversion with Resistance  - 3 x daily - 7 x weekly - 1 sets - 10 reps - Long Sitting Ankle Inversion with Resistance  - 3 x daily - 7 x weekly - 1 sets - 10 reps - Seated Ankle Dorsiflexion with Resistance  - 3 x daily - 7  x weekly - 1 sets - 10 reps - Gastroc Stretch on Wall  - 3 x daily - 7 x weekly - 1 sets - 30 sec hold - Standing Ankle Dorsiflexion Stretch on Chair  - 3 x daily - 7 x weekly - 1 sets - 10 reps - 3 sec hold hold  ASSESSMENT:  CLINICAL IMPRESSION: Today's session focused on left ankle mobility and strength. Added marble pickup to work intrinsic muscle of the plantar portion of foot; patient with noted crepitus plantar fascia with STM. Overall improving mobility.still has some issue with "foot slap" decreased eccentric dorsiflexion control. Patient will benefit from continued skilled therapy services  to address deficits and promote return to optimal function.       OBJECTIVE IMPAIRMENTS Abnormal gait, decreased activity tolerance, decreased balance, decreased endurance, decreased knowledge of condition, decreased mobility, difficulty walking, decreased ROM, decreased strength, hypomobility, increased fascial restrictions, impaired flexibility, and pain.   ACTIVITY LIMITATIONS carrying, standing, stairs, and locomotion level  PARTICIPATION LIMITATIONS: cleaning, laundry, shopping, community activity, and yard work  PERSONAL FACTORS Age, Fitness, and Time since onset of injury/illness/exacerbation are also affecting patient's functional outcome.   REHAB POTENTIAL: Excellent  CLINICAL DECISION MAKING: Stable/uncomplicated  EVALUATION COMPLEXITY: Low   GOALS: Goals reviewed with patient? Yes  SHORT TERM GOALS: Target date: 01/07/22   Patient will be independent with initial HEP and self-management strategies to improve functional outcomes Baseline: initiated Goal status: IN PROGRESS    LONG TERM GOALS: Target date: 01/28/22  Patient will be independent with advanced HEP and self-management strategies to improve functional outcomes Baseline: n/a Goal status: IN PROGRESS  2.  Patient will improve FOTO score to predicted value of 58% to indicate improvement in functional  outcomes Baseline: 43% Goal status: IN PROGRESS  3.  Patient will have Lt ankle AROM  dorsiflexion within 5 degrees of non-affected ankle to improve functional mobility and facilitate improved gait and stair navigation. Baseline: see above Goal status: IN PROGRESS  4. Patient will have equal to or > 4+/5 MMT throughout Bil LEs to improve ability to perform functional mobility, stair ambulation and ADLs.  Baseline: see above Goal status: IN PROGRESS  5. Patient will be able to ambulate at least 400 feet during 2MWT with LRAD to demonstrate improved  ability to perform functional mobility and associated tasks. Baseline: 356 feet Goal status: IN PROGRESS    PLAN: PT FREQUENCY: 2x/week  PT DURATION: 6 weeks  PLANNED INTERVENTIONS: Therapeutic exercises, Therapeutic activity, Neuromuscular re-education, Balance training, Gait training, Patient/Family education, Self Care, Joint mobilization, Joint manipulation, Stair training, DME instructions, Dry Needling, Cryotherapy, Moist heat, Taping, Ultrasound, Ionotophoresis '4mg'$ /ml Dexamethasone, Manual therapy, and Re-evaluation  PLAN FOR NEXT SESSION: Manual therapy - left ankle ROM, gradual strengthening of ankle mm as range improves. Balance, gait, and stair training to improve mechanics.  3:20 PM, 12/23/21 Dequita Schleicher Small Merrill Deanda MPT [place] [facility] [place] [phone]

## 2021-12-24 ENCOUNTER — Encounter (HOSPITAL_COMMUNITY): Payer: Self-pay | Admitting: Physical Therapy

## 2021-12-24 ENCOUNTER — Ambulatory Visit (HOSPITAL_COMMUNITY): Payer: Medicare PPO | Admitting: Physical Therapy

## 2021-12-24 DIAGNOSIS — M7989 Other specified soft tissue disorders: Secondary | ICD-10-CM | POA: Diagnosis not present

## 2021-12-24 DIAGNOSIS — M25572 Pain in left ankle and joints of left foot: Secondary | ICD-10-CM | POA: Diagnosis not present

## 2021-12-24 DIAGNOSIS — M66872 Spontaneous rupture of other tendons, left ankle and foot: Secondary | ICD-10-CM | POA: Diagnosis not present

## 2021-12-24 DIAGNOSIS — R262 Difficulty in walking, not elsewhere classified: Secondary | ICD-10-CM

## 2021-12-24 DIAGNOSIS — Z9889 Other specified postprocedural states: Secondary | ICD-10-CM | POA: Diagnosis not present

## 2021-12-24 DIAGNOSIS — R2689 Other abnormalities of gait and mobility: Secondary | ICD-10-CM

## 2021-12-24 NOTE — Therapy (Signed)
OUTPATIENT PHYSICAL THERAPY LOWER EXTREMITY TREATMENT   Patient Name: YARELIE HAMS MRN: [id number] DOB:06/19/1950, 71 y.o., female Today's Date: 12/24/2021   PT End of Session - 12/24/21 1034     Visit Number 4    Number of Visits 12    Date for PT Re-Evaluation 01/28/22    Authorization Type [employer] Medicare Choice (secondary [employer])    Authorization Time Period Approved 12 visits 9/12-10/24    Authorization - Visit Number 3    Authorization - Number of Visits 12    Progress Note Due on Visit 10    PT Start Time 1034    PT Stop Time 1113    PT Time Calculation (min) 39 min    Activity Tolerance Patient tolerated treatment well    Behavior During Therapy WFL for tasks assessed/performed              Past Medical History:  Diagnosis Date   Abdominal pain    Acid reflux    Chronic diarrhea    DDD (degenerative disc disease)    Depression    Diabetes mellitus without complication (HCC)    diet controlled   Dysphagia    Fibromyalgia    GERD (gastroesophageal reflux disease)    Hypertension    Meningioma (HCC)    PONV (postoperative nausea and vomiting)    nausea, pt must not vomit due to reflux surgery   Past Surgical History:  Procedure Laterality Date   ABDOMINAL HYSTERECTOMY  age 44   ANTERIOR AND POSTERIOR REPAIR N/A 01/18/2013   Procedure: ANTERIOR (CYSTOCELE) ;  Surgeon: Reece Packer, MD;  Location: [facility];  Service: Urology;  Laterality: N/A;   APPENDECTOMY  age 67   both ovaries removed   BREAST SURGERY Left 21 yrs ago   benign lump removed   CHOLECYSTECTOMY     COLONOSCOPY  11/11/07   NUR   CYSTOSCOPY N/A 01/18/2013   Procedure: CYSTOSCOPY;  Surgeon: Reece Packer, MD;  Location: [facility];  Service: Urology;  Laterality: N/A;   EYE SURGERY     lasix eye surgery Bilateral    PROCTOSCOPY  01/18/2013   Procedure: PROCTOSCOPY;  Surgeon: Reece Packer, MD;  Location[facility];  Service: Urology;;   surgery  for acid reflux  1999    TONSILLECTOMY     TRANSFORAMINAL LUMBAR INTERBODY FUSION (TLIF) WITH PEDICLE SCREW FIXATION 1 LEVEL Left 04/04/2020   Procedure: Left Lumbar four-five Transforaminal lumbar interbody fusion;  Surgeon: Erline Levine, MD;  Location: [facility];  Service: Neurosurgery;  Laterality: Left;   UPPER GASTROINTESTINAL ENDOSCOPY  2006   Patient Active Problem List   Diagnosis Date Noted   Primary osteoarthritis of right hip 07/16/2020   Acute right hip pain 06/27/2020   Elevated blood-pressure reading, without diagnosis of hypertension 04/25/2020   Spondylolisthesis of lumbar region 04/04/2020   Chronic bilateral low back pain with left-sided sciatica 03/05/2020   Herniated nucleus pulposus, lumbar 03/05/2020   Other chronic pain 03/05/2020   Benign meningioma ([place]) 02/14/2019   Body mass index (BMI) 29.0-29.9, adult 02/14/2019   Dyspareunia in female 01/04/2019   Cerebral meningioma ([place]) 08/10/2013   Unspecified hereditary and idiopathic peripheral neuropathy 07/07/2013   Memory changes 07/07/2013   Type II or unspecified type diabetes mellitus without mention of complication, not stated as uncontrolled 07/07/2013   Unspecified essential hypertension 07/07/2013    PCP: Sharilyn Sites, MD  REFERRING PROVIDER: Tyson Dense, DPM  REFERRING DIAG: [phone] (ICD-10-CM) - Nontraumatic tear of  left tibialis posterior tendon M79.89 (ICD-10-CM) - Mass of soft tissue of foot Z98.890 (ICD-10-CM) - Status post left foot surgery   THERAPY DIAG:  Pain in left ankle and joints of left foot  Difficulty in walking, not elsewhere classified  Other abnormalities of gait and mobility  Rationale for Evaluation and Treatment Rehabilitation  ONSET DATE: Oct 2022  SUBJECTIVE:   SUBJECTIVE STATEMENT: Reports she is able to move her ankle better, still a little stiff but notices improvement. No issues following previous appointment.  PERTINENT HISTORY: Reports hx of lumbar fusion  PAIN:  Are you having pain?  Yes: NPRS scale: 1/10 Pain location: left medial ankle Pain description: stiffness Aggravating factors: walking, stairs Relieving factors: rest  PRECAUTIONS: None  WEIGHT BEARING RESTRICTIONS No  FALLS:  Has patient fallen in last 6 months? No  LIVING ENVIRONMENT: Lives with: lives with their spouse Lives in: House/apartment Stairs: Yes: External: 1 steps; none Has following equipment at home: None  OCCUPATION: Retired  PLOF: Independent  PATIENT GOALS Reduce pain, walk at least 30 minutes for exercise because of back issues, navigate stairs confidently.    OBJECTIVE:   DIAGNOSTIC FINDINGS:  IMPRESSION 1. No internal derangement of the left ankle. 2. Prominence of the subcutaneous fat along the anteromedial aspect of the ankle at the site of clinical palpable abnormality without a defined encapsulated mass or fluid collection.  PATIENT SURVEYS:  FOTO 43%  COGNITION:  Overall cognitive status: Within functional limits for tasks assessed     SENSATION: WFL  LOWER EXTREMITY MMT:   MMT Right eval Left eval  Hip flexion 5 4  Hip extension    Hip abduction 5 5  Hip adduction    Hip internal rotation    Hip external rotation    Knee flexion 4+ 4+  Knee extension 5 5  Ankle dorsiflexion 5 5  Ankle plantarflexion    Ankle inversion 4+ 4  Ankle eversion 5 4   (Blank rows = not tested)  LOWER EXTREMITY ROM:   Active ROM Right eval Left eval  Hip flexion    Hip extension    Hip abduction    Hip adduction    Hip internal rotation    Hip external rotation    Knee flexion    Knee extension    Ankle dorsiflexion 11 knee ext 5 knee flex  -5 knee ext 1 knee flex  Ankle plantarflexion 30 30  Ankle inversion 14 11  Ankle eversion 5 4   (Blank rows = not tested)  LOWER EXTREMITY SPECIAL TESTS:  No overt laxity noted. Restricted talocrural motion with AP and PA glides and mildly painful.  FUNCTIONAL TESTS:  5 times sit to stand: 11.5 sec 2 minute  walk test: 356 feet no AD  GAIT: Distance walked: 356' Assistive device utilized: None Level of assistance: Complete Independence Comments: rapid forefoot slap at initial heel strike, narrow BOS, intermittent stagger towards left, early heel off at terminal stance    TODAY'S TREATMENT: 12/24/21 Manual Therapy - left ankle long axis distraction with dorsiflexion bias 3'. AP talocrural mobilization grade III-IV 15 oscillations x 10; to increase ROM, reduce pain  Seated: Soleus stretch x2' Marble pick up x 1 round  Standing: Ankle dorsiflexion leaning on wall 2x10 Slant board knees extended; 3 x 30" Ankle dorsiflexion stretch on chair 10 x 3" hold (posterior glide manually applied to distal tib/fib joint) Tandem stance on foam 2x30" ea SLS on firm with toe touch for feedback 3x15"  Gait  training down hallway with emphasis on improved heel strike and controlled eccentric dorsiflexion   12/23/21 STM and manual stretching x 10' to decrease pain and improve tissue mobility  Seated: Heel slides x 2' Marble pick up x 1 round Heel/toe raises x 20  Standing: Slant board 5 x 20" 12" box knee drives for soleus stretch x 10        12/19/21 Reviewed HEP Manual Therapy - left ankle long axis distraction with dorsiflexion bias 3'. AP talocrural mobilization grade III-IV 15 oscillations x 10 Seated:    GTB ankle dorsiflexion 2x10  GTB ankle eversion 2x10  GTB ankle inversion 2x10 Standing:  Gastroc stretch 2x30"  Soleus stretch 2x30"  Ankle dorsiflexion stretch on chair 10 x 3" hold (posterior glide manually applied to distal tib/fib joint)    Ankle dorsiflexion leaning on wall 2x10  Heel raise 2x10    PATIENT EDUCATION:  Education details: Findings, HEP, Role of PT, symptom awareness and activity modifications Person educated: Patient Education method: Explanation Education comprehension: verbalized understanding   HOME EXERCISE PROGRAM: Access Code:  [email or web address]  12/19/21 - Standing Tandem Balance with Counter Support (Mirrored)  - 3 x daily - 7 x weekly - 2 sets - 20 seconds hold   URL: [email or web address] Date: 12/17/2021 Prepared by: Candie Mile  Exercises - Long Sitting Ankle Eversion with Resistance  - 3 x daily - 7 x weekly - 1 sets - 10 reps - Long Sitting Ankle Inversion with Resistance  - 3 x daily - 7 x weekly - 1 sets - 10 reps - Seated Ankle Dorsiflexion with Resistance  - 3 x daily - 7 x weekly - 1 sets - 10 reps - Gastroc Stretch on Wall  - 3 x daily - 7 x weekly - 1 sets - 30 sec hold - Standing Ankle Dorsiflexion Stretch on Chair  - 3 x daily - 7 x weekly - 1 sets - 10 reps - 3 sec hold hold  ASSESSMENT:  CLINICAL IMPRESSION: Improved control and range with leaning dorsiflexion exercise. Provided proprioceptive control training on foam, intermittent UE support required. Patient will continue to benefit from skilled physical therapy services to further improve independence with functional mobility.     OBJECTIVE IMPAIRMENTS Abnormal gait, decreased activity tolerance, decreased balance, decreased endurance, decreased knowledge of condition, decreased mobility, difficulty walking, decreased ROM, decreased strength, hypomobility, increased fascial restrictions, impaired flexibility, and pain.   ACTIVITY LIMITATIONS carrying, standing, stairs, and locomotion level  PARTICIPATION LIMITATIONS: cleaning, laundry, shopping, community activity, and yard work  PERSONAL FACTORS Age, Fitness, and Time since onset of injury/illness/exacerbation are also affecting patient's functional outcome.   REHAB POTENTIAL: Excellent  CLINICAL DECISION MAKING: Stable/uncomplicated  EVALUATION COMPLEXITY: Low   GOALS: Goals reviewed with patient? Yes  SHORT TERM GOALS: Target date: 01/07/22   Patient will be independent with initial HEP and self-management strategies to improve functional outcomes Baseline:  initiated Goal status: IN PROGRESS    LONG TERM GOALS: Target date: 01/28/22  Patient will be independent with advanced HEP and self-management strategies to improve functional outcomes Baseline: n/a Goal status: IN PROGRESS  2.  Patient will improve FOTO score to predicted value of 58% to indicate improvement in functional outcomes Baseline: 43% Goal status: IN PROGRESS  3.  Patient will have Lt ankle AROM  dorsiflexion within 5 degrees of non-affected ankle to improve functional mobility and facilitate improved gait and stair navigation. Baseline: see above Goal status: IN PROGRESS  4. Patient will have equal to  or > 4+/5 MMT throughout Bil LEs to improve ability to perform functional mobility, stair ambulation and ADLs.  Baseline: see above Goal status: IN PROGRESS  5. Patient will be able to ambulate at least 400 feet during 2MWT with LRAD to demonstrate improved ability to perform functional mobility and associated tasks. Baseline: 356 feet Goal status: IN PROGRESS    PLAN: PT FREQUENCY: 2x/week  PT DURATION: 6 weeks  PLANNED INTERVENTIONS: Therapeutic exercises, Therapeutic activity, Neuromuscular re-education, Balance training, Gait training, Patient/Family education, Self Care, Joint mobilization, Joint manipulation, Stair training, DME instructions, Dry Needling, Cryotherapy, Moist heat, Taping, Ultrasound, Ionotophoresis '4mg'$ /ml Dexamethasone, Manual therapy, and Re-evaluation  PLAN FOR NEXT SESSION: Manual therapy - left ankle ROM, gradual strengthening of ankle mm as range improves. Balance, gait, and stair training to improve mechanics.  11:16 AM, 12/24/21 Candie Mile, PT, DPT Physical Therapist [facility]

## 2021-12-30 ENCOUNTER — Ambulatory Visit (HOSPITAL_COMMUNITY): Payer: Medicare PPO | Admitting: Physical Therapy

## 2021-12-30 DIAGNOSIS — M25572 Pain in left ankle and joints of left foot: Secondary | ICD-10-CM | POA: Diagnosis not present

## 2021-12-30 DIAGNOSIS — R2689 Other abnormalities of gait and mobility: Secondary | ICD-10-CM

## 2021-12-30 DIAGNOSIS — R262 Difficulty in walking, not elsewhere classified: Secondary | ICD-10-CM

## 2021-12-30 DIAGNOSIS — M66872 Spontaneous rupture of other tendons, left ankle and foot: Secondary | ICD-10-CM | POA: Diagnosis not present

## 2021-12-30 DIAGNOSIS — M7989 Other specified soft tissue disorders: Secondary | ICD-10-CM | POA: Diagnosis not present

## 2021-12-30 DIAGNOSIS — Z9889 Other specified postprocedural states: Secondary | ICD-10-CM | POA: Diagnosis not present

## 2021-12-30 NOTE — Therapy (Signed)
OUTPATIENT PHYSICAL THERAPY LOWER EXTREMITY TREATMENT   Patient Name: Mindy Ellis MRN: [id number] DOB:1950/12/14, 71 y.o., female Today's Date: 12/30/2021   PT End of Session - 12/30/21 1650     Visit Number 5    Number of Visits 12    Date for PT Re-Evaluation 01/28/22    Authorization Type [employer] Choice (secondary [employer])    Authorization Time Period Approved 12 visits 9/12-10/24    Authorization - Visit Number 4    Authorization - Number of Visits 12    Progress Note Due on Visit 10    PT Start Time 7106    PT Stop Time 1730    PT Time Calculation (min) 40 min    Activity Tolerance Patient tolerated treatment well    Behavior During Therapy WFL for tasks assessed/performed              Past Medical History:  Diagnosis Date   Abdominal pain    Acid reflux    Chronic diarrhea    DDD (degenerative disc disease)    Depression    Diabetes mellitus without complication (HCC)    diet controlled   Dysphagia    Fibromyalgia    GERD (gastroesophageal reflux disease)    Hypertension    Meningioma (HCC)    PONV (postoperative nausea and vomiting)    nausea, pt must not vomit due to reflux surgery   Past Surgical History:  Procedure Laterality Date   ABDOMINAL HYSTERECTOMY  age 81   ANTERIOR AND POSTERIOR REPAIR N/A 01/18/2013   Procedure: ANTERIOR (CYSTOCELE) ;  Surgeon: Reece Packer, MD;  Location: [facility];  Service: Urology;  Laterality: N/A;   APPENDECTOMY  age 56   both ovaries removed   BREAST SURGERY Left 21 yrs ago   benign lump removed   CHOLECYSTECTOMY     COLONOSCOPY  11/11/07   NUR   CYSTOSCOPY N/A 01/18/2013   Procedure: CYSTOSCOPY;  Surgeon: Reece Packer, MD;  Location: [facility];  Service: Urology;  Laterality: N/A;   EYE SURGERY     lasix eye surgery Bilateral    PROCTOSCOPY  01/18/2013   Procedure: PROCTOSCOPY;  Surgeon: Reece Packer, MD;  Location: [facility];  Service: Urology;;   surgery  for acid reflux  1999    TONSILLECTOMY     TRANSFORAMINAL LUMBAR INTERBODY FUSION (TLIF) WITH PEDICLE SCREW FIXATION 1 LEVEL Left 04/04/2020   Procedure: Left Lumbar four-five Transforaminal lumbar interbody fusion;  Surgeon: Erline Levine, MD;  Location: [place];  Service: Neurosurgery;  Laterality: Left;   UPPER GASTROINTESTINAL ENDOSCOPY  2006   Patient Active Problem List   Diagnosis Date Noted   Primary osteoarthritis of right hip 07/16/2020   Acute right hip pain 06/27/2020   Elevated blood-pressure reading, without diagnosis of hypertension 04/25/2020   Spondylolisthesis of lumbar region 04/04/2020   Chronic bilateral low back pain with left-sided sciatica 03/05/2020   Herniated nucleus pulposus, lumbar 03/05/2020   Other chronic pain 03/05/2020   Benign meningioma ([place]) 02/14/2019   Body mass index (BMI) 29.0-29.9, adult 02/14/2019   Dyspareunia in female 01/04/2019   Cerebral meningioma ([place]) 08/10/2013   Unspecified hereditary and idiopathic peripheral neuropathy 07/07/2013   Memory changes 07/07/2013   Type II or unspecified type diabetes mellitus without mention of complication, not stated as uncontrolled 07/07/2013   Unspecified essential hypertension 07/07/2013    PCP: Sharilyn Sites, MD  REFERRING PROVIDER: Tyson Dense, DPM  REFERRING DIAG: [phone] (ICD-10-CM) - Nontraumatic tear of  left tibialis posterior tendon M79.89 (ICD-10-CM) - Mass of soft tissue of foot Z98.890 (ICD-10-CM) - Status post left foot surgery   THERAPY DIAG:  Pain in left ankle and joints of left foot  Difficulty in walking, not elsewhere classified  Other abnormalities of gait and mobility  Rationale for Evaluation and Treatment Rehabilitation  ONSET DATE: Oct 2022  SUBJECTIVE:   SUBJECTIVE STATEMENT: Reports she is able to move her ankle better, still a little stiff but notices improvement. No issues following previous appointment.  PERTINENT HISTORY: Reports hx of lumbar fusion  PAIN:  Are you having pain?  Yes: NPRS scale: 1/10 Pain location: left medial ankle Pain description: stiffness Aggravating factors: walking, stairs Relieving factors: rest  PRECAUTIONS: None  WEIGHT BEARING RESTRICTIONS No  FALLS:  Has patient fallen in last 6 months? No  LIVING ENVIRONMENT: Lives with: lives with their spouse Lives in: House/apartment Stairs: Yes: External: 1 steps; none Has following equipment at home: None  OCCUPATION: Retired  PLOF: Independent  PATIENT GOALS Reduce pain, walk at least 30 minutes for exercise because of back issues, navigate stairs confidently.    OBJECTIVE:   DIAGNOSTIC FINDINGS:  IMPRESSION 1. No internal derangement of the left ankle. 2. Prominence of the subcutaneous fat along the anteromedial aspect of the ankle at the site of clinical palpable abnormality without a defined encapsulated mass or fluid collection.  PATIENT SURVEYS:  FOTO 43%  COGNITION:  Overall cognitive status: Within functional limits for tasks assessed     SENSATION: WFL  LOWER EXTREMITY MMT:   MMT Right eval Left eval  Hip flexion 5 4  Hip extension    Hip abduction 5 5  Hip adduction    Hip internal rotation    Hip external rotation    Knee flexion 4+ 4+  Knee extension 5 5  Ankle dorsiflexion 5 5  Ankle plantarflexion    Ankle inversion 4+ 4  Ankle eversion 5 4   (Blank rows = not tested)  LOWER EXTREMITY ROM:   Active ROM Right eval Left eval  Hip flexion    Hip extension    Hip abduction    Hip adduction    Hip internal rotation    Hip external rotation    Knee flexion    Knee extension    Ankle dorsiflexion 11 knee ext 5 knee flex  -5 knee ext 1 knee flex  Ankle plantarflexion 30 30  Ankle inversion 14 11  Ankle eversion 5 4   (Blank rows = not tested)  LOWER EXTREMITY SPECIAL TESTS:  No overt laxity noted. Restricted talocrural motion with AP and PA glides and mildly painful.  FUNCTIONAL TESTS:  5 times sit to stand: 11.5 sec 2 minute  walk test: 356 feet no AD  GAIT: Distance walked: 356' Assistive device utilized: None Level of assistance: Complete Independence Comments: rapid forefoot slap at initial heel strike, narrow BOS, intermittent stagger towards left, early heel off at terminal stance    TODAY'S TREATMENT: 12/30/21 Standing:  Slant board 3X30"  PF stretch Lt LE on step 2X30"  Heelraises 10X  Toeraises 10X  Tandem stance on foam 2x30" ea  SLS with HHA as needed 30" each LE Seated:  marble pick up 2X (16 marbles)  Towel inversion/eversion (windshield wipers Manual:  supine to medial ankle/scar massage, PROM    12/24/21 Manual Therapy - left ankle long axis distraction with dorsiflexion bias 3'. AP talocrural mobilization grade III-IV 15 oscillations x 10; to increase ROM, reduce pain  Seated: Soleus stretch x2' Marble pick up x 1 round Standing: Ankle dorsiflexion leaning on wall 2x10 Slant board knees extended; 3 x 30" Ankle dorsiflexion stretch on chair 10 x 3" hold (posterior glide manually applied to distal tib/fib joint) Tandem stance on foam 2x30" ea SLS on firm with toe touch for feedback 3x15" Gait training down hallway with emphasis on improved heel strike and controlled eccentric dorsiflexion   12/23/21 STM and manual stretching x 10' to decrease pain and improve tissue mobility Seated: Heel slides x 2' Marble pick up x 1 round Heel/toe raises x 20 Standing: Slant board 5 x 20" 12" box knee drives for soleus stretch x 10     12/19/21 Reviewed HEP Manual Therapy - left ankle long axis distraction with dorsiflexion bias 3'. AP talocrural mobilization grade III-IV 15 oscillations x 10 Seated:  GTB ankle dorsiflexion 2x10   GTB ankle eversion 2x10   GTB ankle inversion 2x10 Standing: Gastroc stretch 2x30" Soleus stretch 2x30" Ankle dorsiflexion stretch on chair 10 x 3" hold (posterior glide manually applied to distal tib/fib joint) Ankle dorsiflexion leaning on wall 2x10             Heel raise 2x10    PATIENT EDUCATION:  Education details: Findings, HEP, Role of PT, symptom awareness and activity modifications Person educated: Patient Education method: Explanation Education comprehension: verbalized understanding   HOME EXERCISE PROGRAM: Access Code: [email or web address]  12/19/21 - Standing Tandem Balance with Counter Support (Mirrored)  - 3 x daily - 7 x weekly - 2 sets - 20 seconds hold   URL: [email or web address] Date: 12/17/2021 Prepared by: Candie Mile  Exercises - Long Sitting Ankle Eversion with Resistance  - 3 x daily - 7 x weekly - 1 sets - 10 reps - Long Sitting Ankle Inversion with Resistance  - 3 x daily - 7 x weekly - 1 sets - 10 reps - Seated Ankle Dorsiflexion with Resistance  - 3 x daily - 7 x weekly - 1 sets - 10 reps - Gastroc Stretch on Wall  - 3 x daily - 7 x weekly - 1 sets - 30 sec hold - Standing Ankle Dorsiflexion Stretch on Chair  - 3 x daily - 7 x weekly - 1 sets - 10 reps - 3 sec hold hold  ASSESSMENT:  CLINICAL IMPRESSION: Continued with focus on improving motor control, proprioception and general strength/ROM.  Instructed with standing PF stretch to also help to stretch gastroc mm using step. Improved ability to pick up marbles today and completed 2RT.  Continued with manual to reduce scar tissue and improve mobility.  Patient will continue to benefit from skilled physical therapy services to further improve independence with functional mobility.     OBJECTIVE IMPAIRMENTS Abnormal gait, decreased activity tolerance, decreased balance, decreased endurance, decreased knowledge of condition, decreased mobility, difficulty walking, decreased ROM, decreased strength, hypomobility, increased fascial restrictions, impaired flexibility, and pain.   ACTIVITY LIMITATIONS carrying, standing, stairs, and locomotion level  PARTICIPATION LIMITATIONS: cleaning, laundry, shopping, community activity, and yard work  PERSONAL  FACTORS Age, Fitness, and Time since onset of injury/illness/exacerbation are also affecting patient's functional outcome.   REHAB POTENTIAL: Excellent  CLINICAL DECISION MAKING: Stable/uncomplicated  EVALUATION COMPLEXITY: Low   GOALS: Goals reviewed with patient? Yes  SHORT TERM GOALS: Target date: 01/07/22   Patient will be independent with initial HEP and self-management strategies to improve functional outcomes Baseline: initiated Goal status: IN PROGRESS    LONG TERM GOALS: Target date: 01/28/22  Patient  will be independent with advanced HEP and self-management strategies to improve functional outcomes Baseline: n/a Goal status: IN PROGRESS  2.  Patient will improve FOTO score to predicted value of 58% to indicate improvement in functional outcomes Baseline: 43% Goal status: IN PROGRESS  3.  Patient will have Lt ankle AROM  dorsiflexion within 5 degrees of non-affected ankle to improve functional mobility and facilitate improved gait and stair navigation. Baseline: see above Goal status: IN PROGRESS  4. Patient will have equal to or > 4+/5 MMT throughout Bil LEs to improve ability to perform functional mobility, stair ambulation and ADLs.  Baseline: see above Goal status: IN PROGRESS  5. Patient will be able to ambulate at least 400 feet during 2MWT with LRAD to demonstrate improved ability to perform functional mobility and associated tasks. Baseline: 356 feet Goal status: IN PROGRESS    PLAN: PT FREQUENCY: 2x/week  PT DURATION: 6 weeks  PLANNED INTERVENTIONS: Therapeutic exercises, Therapeutic activity, Neuromuscular re-education, Balance training, Gait training, Patient/Family education, Self Care, Joint mobilization, Joint manipulation, Stair training, DME instructions, Dry Needling, Cryotherapy, Moist heat, Taping, Ultrasound, Ionotophoresis '4mg'$ /ml Dexamethasone, Manual therapy, and Re-evaluation  PLAN FOR NEXT SESSION: Manual therapy - left ankle ROM,  gradual strengthening of ankle mm as range improves. Balance, gait, and stair training to improve mechanics.  4:51 PM, 12/30/21 Teena Irani, PTA/CLT [place] Ph: [phone]

## 2022-01-02 ENCOUNTER — Ambulatory Visit (HOSPITAL_COMMUNITY): Payer: Medicare PPO | Admitting: Physical Therapy

## 2022-01-02 ENCOUNTER — Encounter (HOSPITAL_COMMUNITY): Payer: Self-pay | Admitting: Physical Therapy

## 2022-01-02 DIAGNOSIS — R262 Difficulty in walking, not elsewhere classified: Secondary | ICD-10-CM

## 2022-01-02 DIAGNOSIS — M25572 Pain in left ankle and joints of left foot: Secondary | ICD-10-CM | POA: Diagnosis not present

## 2022-01-02 DIAGNOSIS — M66872 Spontaneous rupture of other tendons, left ankle and foot: Secondary | ICD-10-CM | POA: Diagnosis not present

## 2022-01-02 DIAGNOSIS — R2689 Other abnormalities of gait and mobility: Secondary | ICD-10-CM

## 2022-01-02 DIAGNOSIS — Z9889 Other specified postprocedural states: Secondary | ICD-10-CM | POA: Diagnosis not present

## 2022-01-02 DIAGNOSIS — M7989 Other specified soft tissue disorders: Secondary | ICD-10-CM | POA: Diagnosis not present

## 2022-01-02 NOTE — Therapy (Signed)
OUTPATIENT PHYSICAL THERAPY LOWER EXTREMITY TREATMENT   Patient Name: Mindy Ellis MRN: [id number] DOB:11/28/1950, 71 y.o., female Today's Date: 01/02/2022   PT End of Session - 01/02/22 1258     Visit Number 6    Number of Visits 12    Date for PT Re-Evaluation 01/28/22    Authorization Type [employer] Choice (secondary [employer])    Authorization Time Period Approved 12 visits 9/12-10/24    Authorization - Visit Number 5    Authorization - Number of Visits 12    Progress Note Due on Visit 10    PT Start Time 1300    PT Stop Time 1338    PT Time Calculation (min) 38 min    Activity Tolerance Patient tolerated treatment well    Behavior During Therapy WFL for tasks assessed/performed              Past Medical History:  Diagnosis Date   Abdominal pain    Acid reflux    Chronic diarrhea    DDD (degenerative disc disease)    Depression    Diabetes mellitus without complication (HCC)    diet controlled   Dysphagia    Fibromyalgia    GERD (gastroesophageal reflux disease)    Hypertension    Meningioma (HCC)    PONV (postoperative nausea and vomiting)    nausea, pt must not vomit due to reflux surgery   Past Surgical History:  Procedure Laterality Date   ABDOMINAL HYSTERECTOMY  age 64   ANTERIOR AND POSTERIOR REPAIR N/A 01/18/2013   Procedure: ANTERIOR (CYSTOCELE) ;  Surgeon: Reece Packer, MD;  Location: [facility];  Service: Urology;  Laterality: N/A;   APPENDECTOMY  age 85   both ovaries removed   BREAST SURGERY Left 21 yrs ago   benign lump removed   CHOLECYSTECTOMY     COLONOSCOPY  11/11/07   NUR   CYSTOSCOPY N/A 01/18/2013   Procedure: CYSTOSCOPY;  Surgeon: Reece Packer, MD;  Location: [facility];  Service: Urology;  Laterality: N/A;   EYE SURGERY     lasix eye surgery Bilateral    PROCTOSCOPY  01/18/2013   Procedure: PROCTOSCOPY;  Surgeon: Reece Packer, MD;  Location: [facility];  Service: Urology;;   surgery  for acid reflux  1999    TONSILLECTOMY     TRANSFORAMINAL LUMBAR INTERBODY FUSION (TLIF) WITH PEDICLE SCREW FIXATION 1 LEVEL Left 04/04/2020   Procedure: Left Lumbar four-five Transforaminal lumbar interbody fusion;  Surgeon: Erline Levine, MD;  Location: [place];  Service: Neurosurgery;  Laterality: Left;   UPPER GASTROINTESTINAL ENDOSCOPY  2006   Patient Active Problem List   Diagnosis Date Noted   Primary osteoarthritis of right hip 07/16/2020   Acute right hip pain 06/27/2020   Elevated blood-pressure reading, without diagnosis of hypertension 04/25/2020   Spondylolisthesis of lumbar region 04/04/2020   Chronic bilateral low back pain with left-sided sciatica 03/05/2020   Herniated nucleus pulposus, lumbar 03/05/2020   Other chronic pain 03/05/2020   Benign meningioma ([place]) 02/14/2019   Body mass index (BMI) 29.0-29.9, adult 02/14/2019   Dyspareunia in female 01/04/2019   Cerebral meningioma ([place]) 08/10/2013   Unspecified hereditary and idiopathic peripheral neuropathy 07/07/2013   Memory changes 07/07/2013   Type II or unspecified type diabetes mellitus without mention of complication, not stated as uncontrolled 07/07/2013   Unspecified essential hypertension 07/07/2013    PCP: Sharilyn Sites, MD  REFERRING PROVIDER: Tyson Dense, DPM  REFERRING DIAG: [phone] (ICD-10-CM) - Nontraumatic tear of  left tibialis posterior tendon M79.89 (ICD-10-CM) - Mass of soft tissue of foot Z98.890 (ICD-10-CM) - Status post left foot surgery   THERAPY DIAG:  Pain in left ankle and joints of left foot  Difficulty in walking, not elsewhere classified  Other abnormalities of gait and mobility  Rationale for Evaluation and Treatment Rehabilitation  ONSET DATE: Oct 2022  SUBJECTIVE:   SUBJECTIVE STATEMENT: She had a massage yesterday and it helped. She feels like her foot is doing better.   PERTINENT HISTORY: Reports hx of lumbar fusion  PAIN:  Are you having pain? Yes: NPRS scale: 1/10 Pain location: left medial  ankle Pain description: stiffness Aggravating factors: walking, stairs Relieving factors: rest  PRECAUTIONS: None  WEIGHT BEARING RESTRICTIONS No  FALLS:  Has patient fallen in last 6 months? No  LIVING ENVIRONMENT: Lives with: lives with their spouse Lives in: House/apartment Stairs: Yes: External: 1 steps; none Has following equipment at home: None  OCCUPATION: Retired  PLOF: Independent  PATIENT GOALS Reduce pain, walk at least 30 minutes for exercise because of back issues, navigate stairs confidently.    OBJECTIVE:   DIAGNOSTIC FINDINGS:  IMPRESSION 1. No internal derangement of the left ankle. 2. Prominence of the subcutaneous fat along the anteromedial aspect of the ankle at the site of clinical palpable abnormality without a defined encapsulated mass or fluid collection.  PATIENT SURVEYS:  FOTO 43%  COGNITION:  Overall cognitive status: Within functional limits for tasks assessed     SENSATION: WFL  LOWER EXTREMITY MMT:   MMT Right eval Left eval  Hip flexion 5 4  Hip extension    Hip abduction 5 5  Hip adduction    Hip internal rotation    Hip external rotation    Knee flexion 4+ 4+  Knee extension 5 5  Ankle dorsiflexion 5 5  Ankle plantarflexion    Ankle inversion 4+ 4  Ankle eversion 5 4   (Blank rows = not tested)  LOWER EXTREMITY ROM:   Active ROM Right eval Left eval  Hip flexion    Hip extension    Hip abduction    Hip adduction    Hip internal rotation    Hip external rotation    Knee flexion    Knee extension    Ankle dorsiflexion 11 knee ext 5 knee flex  -5 knee ext 1 knee flex  Ankle plantarflexion 30 30  Ankle inversion 14 11  Ankle eversion 5 4   (Blank rows = not tested)  LOWER EXTREMITY SPECIAL TESTS:  No overt laxity noted. Restricted talocrural motion with AP and PA glides and mildly painful.  FUNCTIONAL TESTS:  5 times sit to stand: 11.5 sec 2 minute walk test: 356 feet no AD  GAIT: Distance  walked: 356' Assistive device utilized: None Level of assistance: Complete Independence Comments: rapid forefoot slap at initial heel strike, narrow BOS, intermittent stagger towards left, early heel off at terminal stance    TODAY'S TREATMENT: 01/02/22 Calf stretch on slant board. 3 x 30 second holds  HR 1 x 15  TR 1 x 15 SLS with HHA as needed 2x 30" each LE Step up 6 inch 2 x 10  Lateral step up 6 inch 2x 10  Seated BAPS x 10 PF/DF, INV/EV, CW/CCW  12/30/21 Standing:  Slant board 3X30"  PF stretch Lt LE on step 2X30"  Heelraises 10X  Toeraises 10X  Tandem stance on foam 2x30" ea  SLS with HHA as needed 30" each LE Seated:  marble pick up 2X (16 marbles)  Towel inversion/eversion (windshield wipers Manual:  supine to medial ankle/scar massage, PROM    12/24/21 Manual Therapy - left ankle long axis distraction with dorsiflexion bias 3'. AP talocrural mobilization grade III-IV 15 oscillations x 10; to increase ROM, reduce pain Seated: Soleus stretch x2' Marble pick up x 1 round Standing: Ankle dorsiflexion leaning on wall 2x10 Slant board knees extended; 3 x 30" Ankle dorsiflexion stretch on chair 10 x 3" hold (posterior glide manually applied to distal tib/fib joint) Tandem stance on foam 2x30" ea SLS on firm with toe touch for feedback 3x15" Gait training down hallway with emphasis on improved heel strike and controlled eccentric dorsiflexion   12/23/21 STM and manual stretching x 10' to decrease pain and improve tissue mobility Seated: Heel slides x 2' Marble pick up x 1 round Heel/toe raises x 20 Standing: Slant board 5 x 20" 12" box knee drives for soleus stretch x 10    PATIENT EDUCATION:  Education details: Findings, HEP, Role of PT, symptom awareness and activity modifications Person educated: Patient Education method: Explanation Education comprehension: verbalized understanding   HOME EXERCISE PROGRAM: Access Code: [email or web address] 01/02/22 - Step Up  - 1  x daily - 7 x weekly - 2 sets - 10 reps - Lateral Step Up (Mirrored)  - 1 x daily - 7 x weekly - 2 sets - 10 reps  12/19/21 - Standing Tandem Balance with Counter Support (Mirrored)  - 3 x daily - 7 x weekly - 2 sets - 20 seconds hold  Date: 12/17/2021 Exercises - Long Sitting Ankle Eversion with Resistance  - 3 x daily - 7 x weekly - 1 sets - 10 reps - Long Sitting Ankle Inversion with Resistance  - 3 x daily - 7 x weekly - 1 sets - 10 reps - Seated Ankle Dorsiflexion with Resistance  - 3 x daily - 7 x weekly - 1 sets - 10 reps - Gastroc Stretch on Wall  - 3 x daily - 7 x weekly - 1 sets - 30 sec hold - Standing Ankle Dorsiflexion Stretch on Chair  - 3 x daily - 7 x weekly - 1 sets - 10 reps - 3 sec hold hold  ASSESSMENT:  CLINICAL IMPRESSION: Patient ambulating with improving heel strike but slightly flat footed. Continued with mobility and ankle strengthening exercises which are tolerated well. She requires intermittent UE support throughout exercises and frequent HHA with SLS on LLE. Began additional functional strengthening and she demonstrates improving mechanics and balance with increased reps. She notes moderate fatigue at end of session. Patient will continue to benefit from physical therapy in order to improve function and reduce impairment.   OBJECTIVE IMPAIRMENTS Abnormal gait, decreased activity tolerance, decreased balance, decreased endurance, decreased knowledge of condition, decreased mobility, difficulty walking, decreased ROM, decreased strength, hypomobility, increased fascial restrictions, impaired flexibility, and pain.   ACTIVITY LIMITATIONS carrying, standing, stairs, and locomotion level  PARTICIPATION LIMITATIONS: cleaning, laundry, shopping, community activity, and yard work  PERSONAL FACTORS Age, Fitness, and Time since onset of injury/illness/exacerbation are also affecting patient's functional outcome.   REHAB POTENTIAL: Excellent  CLINICAL DECISION MAKING:  Stable/uncomplicated  EVALUATION COMPLEXITY: Low   GOALS: Goals reviewed with patient? Yes  SHORT TERM GOALS: Target date: 01/07/22   Patient will be independent with initial HEP and self-management strategies to improve functional outcomes Baseline: initiated Goal status: IN PROGRESS    LONG TERM GOALS: Target date: 01/28/22  Patient will be independent with  advanced HEP and self-management strategies to improve functional outcomes Baseline: n/a Goal status: IN PROGRESS  2.  Patient will improve FOTO score to predicted value of 58% to indicate improvement in functional outcomes Baseline: 43% Goal status: IN PROGRESS  3.  Patient will have Lt ankle AROM  dorsiflexion within 5 degrees of non-affected ankle to improve functional mobility and facilitate improved gait and stair navigation. Baseline: see above Goal status: IN PROGRESS  4. Patient will have equal to or > 4+/5 MMT throughout Bil LEs to improve ability to perform functional mobility, stair ambulation and ADLs.  Baseline: see above Goal status: IN PROGRESS  5. Patient will be able to ambulate at least 400 feet during 2MWT with LRAD to demonstrate improved ability to perform functional mobility and associated tasks. Baseline: 356 feet Goal status: IN PROGRESS    PLAN: PT FREQUENCY: 2x/week  PT DURATION: 6 weeks  PLANNED INTERVENTIONS: Therapeutic exercises, Therapeutic activity, Neuromuscular re-education, Balance training, Gait training, Patient/Family education, Self Care, Joint mobilization, Joint manipulation, Stair training, DME instructions, Dry Needling, Cryotherapy, Moist heat, Taping, Ultrasound, Ionotophoresis '4mg'$ /ml Dexamethasone, Manual therapy, and Re-evaluation  PLAN FOR NEXT SESSION: Manual therapy - left ankle ROM, gradual strengthening of ankle mm as range improves. Balance, gait, and stair training to improve mechanics.  12:59 PM, 01/02/22 Mearl Latin PT, DPT Physical Therapist at [facility]

## 2022-01-07 ENCOUNTER — Ambulatory Visit (HOSPITAL_COMMUNITY): Payer: Medicare PPO | Attending: Podiatry

## 2022-01-07 DIAGNOSIS — M25572 Pain in left ankle and joints of left foot: Secondary | ICD-10-CM | POA: Insufficient documentation

## 2022-01-07 DIAGNOSIS — R2689 Other abnormalities of gait and mobility: Secondary | ICD-10-CM | POA: Insufficient documentation

## 2022-01-07 DIAGNOSIS — R262 Difficulty in walking, not elsewhere classified: Secondary | ICD-10-CM | POA: Diagnosis not present

## 2022-01-07 NOTE — Therapy (Signed)
OUTPATIENT PHYSICAL THERAPY LOWER EXTREMITY TREATMENT   Patient Name: MYCHELLE KENDRA MRN: [id number] DOB:04-13-1950, 71 y.o., female Today's Date: 01/07/2022   PT End of Session - 01/07/22 1520     Visit Number 7    Number of Visits 12    Date for PT Re-Evaluation 01/28/22    Authorization Type [employer] Medicare Choice (secondary [employer])    Authorization Time Period Approved 12 visits 9/12-10/24    Authorization - Visit Number 6    Authorization - Number of Visits 12    Progress Note Due on Visit 10    PT Start Time 1519    PT Stop Time 1600    PT Time Calculation (min) 41 min    Activity Tolerance Patient tolerated treatment well    Behavior During Therapy WFL for tasks assessed/performed               Past Medical History:  Diagnosis Date   Abdominal pain    Acid reflux    Chronic diarrhea    DDD (degenerative disc disease)    Depression    Diabetes mellitus without complication (HCC)    diet controlled   Dysphagia    Fibromyalgia    GERD (gastroesophageal reflux disease)    Hypertension    Meningioma (HCC)    PONV (postoperative nausea and vomiting)    nausea, pt must not vomit due to reflux surgery   Past Surgical History:  Procedure Laterality Date   ABDOMINAL HYSTERECTOMY  age 57   ANTERIOR AND POSTERIOR REPAIR N/A 01/18/2013   Procedure: ANTERIOR (CYSTOCELE) ;  Surgeon: Reece Packer, MD;  Location: [facility];  Service: Urology;  Laterality: N/A;   APPENDECTOMY  age 14   both ovaries removed   BREAST SURGERY Left 21 yrs ago   benign lump removed   CHOLECYSTECTOMY     COLONOSCOPY  11/11/07   NUR   CYSTOSCOPY N/A 01/18/2013   Procedure: CYSTOSCOPY;  Surgeon: Reece Packer, MD;  Location: [facility];  Service: Urology;  Laterality: N/A;   EYE SURGERY     lasix eye surgery Bilateral    PROCTOSCOPY  01/18/2013   Procedure: PROCTOSCOPY;  Surgeon: Reece Packer, MD;  Location: [facility];  Service: Urology;;   surgery  for acid reflux  1999    TONSILLECTOMY     TRANSFORAMINAL LUMBAR INTERBODY FUSION (TLIF) WITH PEDICLE SCREW FIXATION 1 LEVEL Left 04/04/2020   Procedure: Left Lumbar four-five Transforaminal lumbar interbody fusion;  Surgeon: Erline Levine, MD;  Location: [place];  Service: Neurosurgery;  Laterality: Left;   UPPER GASTROINTESTINAL ENDOSCOPY  2006   Patient Active Problem List   Diagnosis Date Noted   Primary osteoarthritis of right hip 07/16/2020   Acute right hip pain 06/27/2020   Elevated blood-pressure reading, without diagnosis of hypertension 04/25/2020   Spondylolisthesis of lumbar region 04/04/2020   Chronic bilateral low back pain with left-sided sciatica 03/05/2020   Herniated nucleus pulposus, lumbar 03/05/2020   Other chronic pain 03/05/2020   Benign meningioma ([place]) 02/14/2019   Body mass index (BMI) 29.0-29.9, adult 02/14/2019   Dyspareunia in female 01/04/2019   Cerebral meningioma ([place]) 08/10/2013   Unspecified hereditary and idiopathic peripheral neuropathy 07/07/2013   Memory changes 07/07/2013   Type II or unspecified type diabetes mellitus without mention of complication, not stated as uncontrolled 07/07/2013   Unspecified essential hypertension 07/07/2013    PCP: Sharilyn Sites, MD  REFERRING PROVIDER: Tyson Dense, DPM  REFERRING DIAG: [phone] (ICD-10-CM) - Nontraumatic tear  of left tibialis posterior tendon M79.89 (ICD-10-CM) - Mass of soft tissue of foot Z98.890 (ICD-10-CM) - Status post left foot surgery   THERAPY DIAG:  Pain in left ankle and joints of left foot  Difficulty in walking, not elsewhere classified  Other abnormalities of gait and mobility  Rationale for Evaluation and Treatment Rehabilitation  ONSET DATE: Oct 2022  SUBJECTIVE:   SUBJECTIVE STATEMENT: Overall improving; still has the most trouble with her balance; her back is hurting some today 4/10; ankle 0/10  PERTINENT HISTORY: Reports hx of lumbar fusion  PAIN:  Are you having pain? Yes: NPRS scale:  1/10 Pain location: left medial ankle Pain description: stiffness Aggravating factors: walking, stairs Relieving factors: rest  PRECAUTIONS: None  WEIGHT BEARING RESTRICTIONS No  FALLS:  Has patient fallen in last 6 months? No  LIVING ENVIRONMENT: Lives with: lives with their spouse Lives in: House/apartment Stairs: Yes: External: 1 steps; none Has following equipment at home: None  OCCUPATION: Retired  PLOF: Independent  PATIENT GOALS Reduce pain, walk at least 30 minutes for exercise because of back issues, navigate stairs confidently.    OBJECTIVE:   DIAGNOSTIC FINDINGS:  IMPRESSION 1. No internal derangement of the left ankle. 2. Prominence of the subcutaneous fat along the anteromedial aspect of the ankle at the site of clinical palpable abnormality without a defined encapsulated mass or fluid collection.  PATIENT SURVEYS:  FOTO 43%  COGNITION:  Overall cognitive status: Within functional limits for tasks assessed     SENSATION: WFL  LOWER EXTREMITY MMT:   MMT Right eval Left eval  Hip flexion 5 4  Hip extension    Hip abduction 5 5  Hip adduction    Hip internal rotation    Hip external rotation    Knee flexion 4+ 4+  Knee extension 5 5  Ankle dorsiflexion 5 5  Ankle plantarflexion    Ankle inversion 4+ 4  Ankle eversion 5 4   (Blank rows = not tested)  LOWER EXTREMITY ROM:   Active ROM Right eval Left eval  Hip flexion    Hip extension    Hip abduction    Hip adduction    Hip internal rotation    Hip external rotation    Knee flexion    Knee extension    Ankle dorsiflexion 11 knee ext 5 knee flex  -5 knee ext 1 knee flex  Ankle plantarflexion 30 30  Ankle inversion 14 11  Ankle eversion 5 4   (Blank rows = not tested)  LOWER EXTREMITY SPECIAL TESTS:  No overt laxity noted. Restricted talocrural motion with AP and PA glides and mildly painful.  FUNCTIONAL TESTS:  5 times sit to stand: 11.5 sec 2 minute walk test: 356  feet no AD  GAIT: Distance walked: 356' Assistive device utilized: None Level of assistance: Complete Independence Comments: rapid forefoot slap at initial heel strike, narrow BOS, intermittent stagger towards left, early heel off at terminal stance    TODAY'S TREATMENT: 01/07/22 Bike seat 8 x 5' Heel toe raises 2 x 10 Slant board 5 x 20" Mini squats x 20 Tandem stance 2 x 30" each 8" step up x 10 without UE assist Balance board F/B, Inv/Ev x 10 reps each  Manual:  supine to medial ankle/scar massage, PROM x 10'    01/02/22 Calf stretch on slant board. 3 x 30 second holds  HR 1 x 15  TR 1 x 15 SLS with HHA as needed 2x 30" each LE Step  up 6 inch 2 x 10  Lateral step up 6 inch 2x 10  Seated BAPS x 10 PF/DF, INV/EV, CW/CCW  12/30/21 Standing:  Slant board 3X30"  PF stretch Lt LE on step 2X30"  Heelraises 10X  Toeraises 10X  Tandem stance on foam 2x30" ea  SLS with HHA as needed 30" each LE Seated:  marble pick up 2X (16 marbles)  Towel inversion/eversion (windshield wipers Manual:  supine to medial ankle/scar massage, PROM    12/24/21 Manual Therapy - left ankle long axis distraction with dorsiflexion bias 3'. AP talocrural mobilization grade III-IV 15 oscillations x 10; to increase ROM, reduce pain Seated: Soleus stretch x2' Marble pick up x 1 round Standing: Ankle dorsiflexion leaning on wall 2x10 Slant board knees extended; 3 x 30" Ankle dorsiflexion stretch on chair 10 x 3" hold (posterior glide manually applied to distal tib/fib joint) Tandem stance on foam 2x30" ea SLS on firm with toe touch for feedback 3x15" Gait training down hallway with emphasis on improved heel strike and controlled eccentric dorsiflexion   12/23/21 STM and manual stretching x 10' to decrease pain and improve tissue mobility Seated: Heel slides x 2' Marble pick up x 1 round Heel/toe raises x 20 Standing: Slant board 5 x 20" 12" box knee drives for soleus stretch x  10    PATIENT EDUCATION:  Education details: Findings, HEP, Role of PT, symptom awareness and activity modifications Person educated: Patient Education method: Explanation Education comprehension: verbalized understanding   HOME EXERCISE PROGRAM: Access Code: [email or web address] 01/02/22 - Step Up  - 1 x daily - 7 x weekly - 2 sets - 10 reps - Lateral Step Up (Mirrored)  - 1 x daily - 7 x weekly - 2 sets - 10 reps  12/19/21 - Standing Tandem Balance with Counter Support (Mirrored)  - 3 x daily - 7 x weekly - 2 sets - 20 seconds hold  Date: 12/17/2021 Exercises - Long Sitting Ankle Eversion with Resistance  - 3 x daily - 7 x weekly - 1 sets - 10 reps - Long Sitting Ankle Inversion with Resistance  - 3 x daily - 7 x weekly - 1 sets - 10 reps - Seated Ankle Dorsiflexion with Resistance  - 3 x daily - 7 x weekly - 1 sets - 10 reps - Gastroc Stretch on Wall  - 3 x daily - 7 x weekly - 1 sets - 30 sec hold - Standing Ankle Dorsiflexion Stretch on Chair  - 3 x daily - 7 x weekly - 1 sets - 10 reps - 3 sec hold hold  ASSESSMENT:  CLINICAL IMPRESSION: Today's session continued to focus on ankle mobility, strengthening and balance.  Added balance board; patient with moderate difficulty shifting weight needs some UE assist. Patient continues to improve her gait pattern; reports decreased back pain after treatment. Patient will continue to benefit from physical therapy in order to improve function and reduce impairment.   OBJECTIVE IMPAIRMENTS Abnormal gait, decreased activity tolerance, decreased balance, decreased endurance, decreased knowledge of condition, decreased mobility, difficulty walking, decreased ROM, decreased strength, hypomobility, increased fascial restrictions, impaired flexibility, and pain.   ACTIVITY LIMITATIONS carrying, standing, stairs, and locomotion level  PARTICIPATION LIMITATIONS: cleaning, laundry, shopping, community activity, and yard work  PERSONAL FACTORS Age, Fitness,  and Time since onset of injury/illness/exacerbation are also affecting patient's functional outcome.   REHAB POTENTIAL: Excellent  CLINICAL DECISION MAKING: Stable/uncomplicated  EVALUATION COMPLEXITY: Low   GOALS: Goals reviewed with patient? Yes  SHORT  TERM GOALS: Target date: 01/07/22   Patient will be independent with initial HEP and self-management strategies to improve functional outcomes Baseline: initiated Goal status: IN PROGRESS    LONG TERM GOALS: Target date: 01/28/22  Patient will be independent with advanced HEP and self-management strategies to improve functional outcomes Baseline: n/a Goal status: IN PROGRESS  2.  Patient will improve FOTO score to predicted value of 58% to indicate improvement in functional outcomes Baseline: 43% Goal status: IN PROGRESS  3.  Patient will have Lt ankle AROM  dorsiflexion within 5 degrees of non-affected ankle to improve functional mobility and facilitate improved gait and stair navigation. Baseline: see above Goal status: IN PROGRESS  4. Patient will have equal to or > 4+/5 MMT throughout Bil LEs to improve ability to perform functional mobility, stair ambulation and ADLs.  Baseline: see above Goal status: IN PROGRESS  5. Patient will be able to ambulate at least 400 feet during 2MWT with LRAD to demonstrate improved ability to perform functional mobility and associated tasks. Baseline: 356 feet Goal status: IN PROGRESS    PLAN: PT FREQUENCY: 2x/week  PT DURATION: 6 weeks  PLANNED INTERVENTIONS: Therapeutic exercises, Therapeutic activity, Neuromuscular re-education, Balance training, Gait training, Patient/Family education, Self Care, Joint mobilization, Joint manipulation, Stair training, DME instructions, Dry Needling, Cryotherapy, Moist heat, Taping, Ultrasound, Ionotophoresis '4mg'$ /ml Dexamethasone, Manual therapy, and Re-evaluation  PLAN FOR NEXT SESSION: Manual therapy - left ankle ROM, gradual strengthening of  ankle mm as range improves. Balance, gait, and stair training to improve mechanics.  4:04 PM, 01/07/22 Jibril Mcminn Small Mj Willis MPT [facility] [place] [phone]

## 2022-01-08 ENCOUNTER — Ambulatory Visit (HOSPITAL_COMMUNITY): Payer: Medicare PPO | Admitting: Physical Therapy

## 2022-01-08 DIAGNOSIS — M25572 Pain in left ankle and joints of left foot: Secondary | ICD-10-CM

## 2022-01-08 DIAGNOSIS — R2689 Other abnormalities of gait and mobility: Secondary | ICD-10-CM | POA: Diagnosis not present

## 2022-01-08 DIAGNOSIS — R262 Difficulty in walking, not elsewhere classified: Secondary | ICD-10-CM

## 2022-01-08 NOTE — Therapy (Signed)
OUTPATIENT PHYSICAL THERAPY LOWER EXTREMITY TREATMENT   Patient Name: Mindy Ellis MRN: [id number] DOB:Apr 07, 1951, 71 y.o., female Today's Date: 01/08/2022   PT End of Session - 01/08/22 1520     Visit Number 8    Number of Visits 12    Date for PT Re-Evaluation 01/28/22    Authorization Type [employer] Medicare Choice (secondary [employer])    Authorization Time Period Approved 12 visits 9/12-10/24    Authorization - Visit Number 7    Authorization - Number of Visits 12    Progress Note Due on Visit 10    PT Start Time 1520    PT Stop Time 1600    PT Time Calculation (min) 40 min    Activity Tolerance Patient tolerated treatment well    Behavior During Therapy WFL for tasks assessed/performed               Past Medical History:  Diagnosis Date   Abdominal pain    Acid reflux    Chronic diarrhea    DDD (degenerative disc disease)    Depression    Diabetes mellitus without complication (HCC)    diet controlled   Dysphagia    Fibromyalgia    GERD (gastroesophageal reflux disease)    Hypertension    Meningioma (HCC)    PONV (postoperative nausea and vomiting)    nausea, pt must not vomit due to reflux surgery   Past Surgical History:  Procedure Laterality Date   ABDOMINAL HYSTERECTOMY  age 52   ANTERIOR AND POSTERIOR REPAIR N/A 01/18/2013   Procedure: ANTERIOR (CYSTOCELE) ;  Surgeon: Reece Packer, MD;  Location: [facility];  Service: Urology;  Laterality: N/A;   APPENDECTOMY  age 45   both ovaries removed   BREAST SURGERY Left 21 yrs ago   benign lump removed   CHOLECYSTECTOMY     COLONOSCOPY  11/11/07   NUR   CYSTOSCOPY N/A 01/18/2013   Procedure: CYSTOSCOPY;  Surgeon: Reece Packer, MD;  Location: [facility];  Service: Urology;  Laterality: N/A;   EYE SURGERY     lasix eye surgery Bilateral    PROCTOSCOPY  01/18/2013   Procedure: PROCTOSCOPY;  Surgeon: Reece Packer, MD;  Location: [facility];  Service: Urology;;   surgery  for acid reflux  1999    TONSILLECTOMY     TRANSFORAMINAL LUMBAR INTERBODY FUSION (TLIF) WITH PEDICLE SCREW FIXATION 1 LEVEL Left 04/04/2020   Procedure: Left Lumbar four-five Transforaminal lumbar interbody fusion;  Surgeon: Erline Levine, MD;  Location: [place];  Service: Neurosurgery;  Laterality: Left;   UPPER GASTROINTESTINAL ENDOSCOPY  2006   Patient Active Problem List   Diagnosis Date Noted   Primary osteoarthritis of right hip 07/16/2020   Acute right hip pain 06/27/2020   Elevated blood-pressure reading, without diagnosis of hypertension 04/25/2020   Spondylolisthesis of lumbar region 04/04/2020   Chronic bilateral low back pain with left-sided sciatica 03/05/2020   Herniated nucleus pulposus, lumbar 03/05/2020   Other chronic pain 03/05/2020   Benign meningioma ([place]) 02/14/2019   Body mass index (BMI) 29.0-29.9, adult 02/14/2019   Dyspareunia in female 01/04/2019   Cerebral meningioma ([place]) 08/10/2013   Unspecified hereditary and idiopathic peripheral neuropathy 07/07/2013   Memory changes 07/07/2013   Type II or unspecified type diabetes mellitus without mention of complication, not stated as uncontrolled 07/07/2013   Unspecified essential hypertension 07/07/2013    PCP: Sharilyn Sites, MD  REFERRING PROVIDER: Tyson Dense, DPM  REFERRING DIAG: [phone] (ICD-10-CM) - Nontraumatic tear  of left tibialis posterior tendon M79.89 (ICD-10-CM) - Mass of soft tissue of foot Z98.890 (ICD-10-CM) - Status post left foot surgery   THERAPY DIAG:  Pain in left ankle and joints of left foot  Difficulty in walking, not elsewhere classified  Other abnormalities of gait and mobility  Rationale for Evaluation and Treatment Rehabilitation  ONSET DATE: Oct 2022  SUBJECTIVE:   SUBJECTIVE STATEMENT: Overall improving and knows she's walking better. Feels like her balance is still not where it should be. No pain today.  PERTINENT HISTORY: S/P Lt posterior tibialis tendon repain 01/2021; Reports hx of lumbar  fusion  PAIN:  Are you having pain? Yes: NPRS scale: 0/10 Pain location: left medial ankle Pain description: stiffness Aggravating factors: walking, stairs Relieving factors: rest  PRECAUTIONS: None  WEIGHT BEARING RESTRICTIONS No  FALLS:  Has patient fallen in last 6 months? No  LIVING ENVIRONMENT: Lives with: lives with their spouse Lives in: House/apartment Stairs: Yes: External: 1 steps; none Has following equipment at home: None  OCCUPATION: Retired  PLOF: Independent  PATIENT GOALS Reduce pain, walk at least 30 minutes for exercise because of back issues, navigate stairs confidently.    OBJECTIVE:   DIAGNOSTIC FINDINGS:  IMPRESSION 1. No internal derangement of the left ankle. 2. Prominence of the subcutaneous fat along the anteromedial aspect of the ankle at the site of clinical palpable abnormality without a defined encapsulated mass or fluid collection.  PATIENT SURVEYS:  FOTO 43%  COGNITION:  Overall cognitive status: Within functional limits for tasks assessed     SENSATION: WFL  LOWER EXTREMITY MMT:   MMT Right eval Left eval  Hip flexion 5 4  Hip extension    Hip abduction 5 5  Hip adduction    Hip internal rotation    Hip external rotation    Knee flexion 4+ 4+  Knee extension 5 5  Ankle dorsiflexion 5 5  Ankle plantarflexion    Ankle inversion 4+ 4  Ankle eversion 5 4   (Blank rows = not tested)  LOWER EXTREMITY ROM:   Active ROM Right eval Left eval  Hip flexion    Hip extension    Hip abduction    Hip adduction    Hip internal rotation    Hip external rotation    Knee flexion    Knee extension    Ankle dorsiflexion 11 knee ext 5 knee flex  -5 knee ext 1 knee flex  Ankle plantarflexion 30 30  Ankle inversion 14 11  Ankle eversion 5 4   (Blank rows = not tested)  LOWER EXTREMITY SPECIAL TESTS:  No overt laxity noted. Restricted talocrural motion with AP and PA glides and mildly painful.  FUNCTIONAL TESTS:  5  times sit to stand: 11.5 sec 2 minute walk test: 356 feet no AD  GAIT: Distance walked: 356' Assistive device utilized: None Level of assistance: Complete Independence Comments: rapid forefoot slap at initial heel strike, narrow BOS, intermittent stagger towards left, early heel off at terminal stance    TODAY'S TREATMENT: 01/08/22 Bike seat 7;  5 minutes level 2 Standing:  Heel raises on incline with UE assist 20X  Toe raises on incline with UE assist 20X  Slant board stretch 3X30" each  Mini squats 20X in good form  8 inch step ups without UE assist forward  Balance board F/B, inv/ev 10X each without UE assist Balance tandem and sidestepping on blue line 2RT each Manual: supine to Lt medial ankle/scar massage and PROM 10'  01/07/22 Bike seat 8 x 5' Heel toe raises 2 x 10 Slant board 5 x 20" Mini squats x 20 Tandem stance 2 x 30" each 8" step up x 10 without UE assist Balance board F/B, Inv/Ev x 10 reps each  Manual:  supine to medial ankle/scar massage, PROM x 10'    01/02/22 Calf stretch on slant board. 3 x 30 second holds  HR 1 x 15  TR 1 x 15 SLS with HHA as needed 2x 30" each LE Step up 6 inch 2 x 10  Lateral step up 6 inch 2x 10  Seated BAPS x 10 PF/DF, INV/EV, CW/CCW  12/30/21 Standing:  Slant board 3X30"  PF stretch Lt LE on step 2X30"  Heelraises 10X  Toeraises 10X  Tandem stance on foam 2x30" ea  SLS with HHA as needed 30" each LE Seated:  marble pick up 2X (16 marbles)  Towel inversion/eversion (windshield wipers Manual:  supine to medial ankle/scar massage, PROM    12/24/21 Manual Therapy - left ankle long axis distraction with dorsiflexion bias 3'. AP talocrural mobilization grade III-IV 15 oscillations x 10; to increase ROM, reduce pain Seated: Soleus stretch x2' Marble pick up x 1 round Standing: Ankle dorsiflexion leaning on wall 2x10 Slant board knees extended; 3 x 30" Ankle dorsiflexion stretch on chair 10 x 3" hold (posterior glide  manually applied to distal tib/fib joint) Tandem stance on foam 2x30" ea SLS on firm with toe touch for feedback 3x15" Gait training down hallway with emphasis on improved heel strike and controlled eccentric dorsiflexion   12/23/21 STM and manual stretching x 10' to decrease pain and improve tissue mobility Seated: Heel slides x 2' Marble pick up x 1 round Heel/toe raises x 20 Standing: Slant board 5 x 20" 12" box knee drives for soleus stretch x 10    PATIENT EDUCATION:  Education details: Findings, HEP, Role of PT, symptom awareness and activity modifications Person educated: Patient Education method: Explanation Education comprehension: verbalized understanding   HOME EXERCISE PROGRAM: Access Code: [email or web address] 01/02/22 - Step Up  - 1 x daily - 7 x weekly - 2 sets - 10 reps - Lateral Step Up (Mirrored)  - 1 x daily - 7 x weekly - 2 sets - 10 reps  12/19/21 - Standing Tandem Balance with Counter Support (Mirrored)  - 3 x daily - 7 x weekly - 2 sets - 20 seconds hold  Date: 12/17/2021 Exercises - Long Sitting Ankle Eversion with Resistance  - 3 x daily - 7 x weekly - 1 sets - 10 reps - Long Sitting Ankle Inversion with Resistance  - 3 x daily - 7 x weekly - 1 sets - 10 reps - Seated Ankle Dorsiflexion with Resistance  - 3 x daily - 7 x weekly - 1 sets - 10 reps - Gastroc Stretch on Wall  - 3 x daily - 7 x weekly - 1 sets - 30 sec hold - Standing Ankle Dorsiflexion Stretch on Chair  - 3 x daily - 7 x weekly - 1 sets - 10 reps - 3 sec hold hold  ASSESSMENT:  CLINICAL IMPRESSION: Continued focus on ankle mobility, strengthening and balance.  PT able to complete balance board with good stability without UE assist in both directions.  Progressed dynamic gait outside of parallel bars without LOB, however cues to maintain forward gaze.  Noted improvement in gait quality without antalgia present today.  Less restrictions in soft tissue and ROM today with manual noted.  Patient will  continue to benefit from physical therapy in order to improve function and reduce impairment.   OBJECTIVE IMPAIRMENTS Abnormal gait, decreased activity tolerance, decreased balance, decreased endurance, decreased knowledge of condition, decreased mobility, difficulty walking, decreased ROM, decreased strength, hypomobility, increased fascial restrictions, impaired flexibility, and pain.   ACTIVITY LIMITATIONS carrying, standing, stairs, and locomotion level  PARTICIPATION LIMITATIONS: cleaning, laundry, shopping, community activity, and yard work  PERSONAL FACTORS Age, Fitness, and Time since onset of injury/illness/exacerbation are also affecting patient's functional outcome.   REHAB POTENTIAL: Excellent  CLINICAL DECISION MAKING: Stable/uncomplicated  EVALUATION COMPLEXITY: Low   GOALS: Goals reviewed with patient? Yes  SHORT TERM GOALS: Target date: 01/07/22   Patient will be independent with initial HEP and self-management strategies to improve functional outcomes Baseline: initiated Goal status: IN PROGRESS    LONG TERM GOALS: Target date: 01/28/22  Patient will be independent with advanced HEP and self-management strategies to improve functional outcomes Baseline: n/a Goal status: IN PROGRESS  2.  Patient will improve FOTO score to predicted value of 58% to indicate improvement in functional outcomes Baseline: 43% Goal status: IN PROGRESS  3.  Patient will have Lt ankle AROM  dorsiflexion within 5 degrees of non-affected ankle to improve functional mobility and facilitate improved gait and stair navigation. Baseline: see above Goal status: IN PROGRESS  4. Patient will have equal to or > 4+/5 MMT throughout Bil LEs to improve ability to perform functional mobility, stair ambulation and ADLs.  Baseline: see above Goal status: IN PROGRESS  5. Patient will be able to ambulate at least 400 feet during 2MWT with LRAD to demonstrate improved ability to perform functional  mobility and associated tasks. Baseline: 356 feet Goal status: IN PROGRESS    PLAN: PT FREQUENCY: 2x/week  PT DURATION: 6 weeks  PLANNED INTERVENTIONS: Therapeutic exercises, Therapeutic activity, Neuromuscular re-education, Balance training, Gait training, Patient/Family education, Self Care, Joint mobilization, Joint manipulation, Stair training, DME instructions, Dry Needling, Cryotherapy, Moist heat, Taping, Ultrasound, Ionotophoresis '4mg'$ /ml Dexamethasone, Manual therapy, and Re-evaluation  PLAN FOR NEXT SESSION: Manual therapy - left ankle ROM, gradual strengthening of ankle mm as range improves. Balance, gait, and stair training to improve mechanics.  3:20 PM, 01/08/22 Teena Irani, PTA/CLT [place] Ph: [phone]

## 2022-01-13 ENCOUNTER — Ambulatory Visit (HOSPITAL_COMMUNITY): Payer: Medicare PPO | Admitting: Physical Therapy

## 2022-01-13 ENCOUNTER — Encounter (HOSPITAL_COMMUNITY): Payer: Self-pay | Admitting: Physical Therapy

## 2022-01-13 DIAGNOSIS — R262 Difficulty in walking, not elsewhere classified: Secondary | ICD-10-CM

## 2022-01-13 DIAGNOSIS — M25572 Pain in left ankle and joints of left foot: Secondary | ICD-10-CM

## 2022-01-13 DIAGNOSIS — R2689 Other abnormalities of gait and mobility: Secondary | ICD-10-CM

## 2022-01-13 NOTE — Therapy (Signed)
OUTPATIENT PHYSICAL THERAPY LOWER EXTREMITY TREATMENT   Patient Name: Mindy Ellis MRN: [id number] DOB:08/21/1950, 71 y.o., female Today's Date: 01/13/2022   PT End of Session - 01/13/22 1435     Visit Number 9    Number of Visits 12    Date for PT Re-Evaluation 01/28/22    Authorization Type [employer] Medicare Choice (secondary [employer])    Authorization Time Period Approved 12 visits 9/12-10/24    Authorization - Visit Number 8    Authorization - Number of Visits 12    Progress Note Due on Visit 10    PT Start Time 1435    PT Stop Time 1513    PT Time Calculation (min) 38 min    Activity Tolerance Patient tolerated treatment well    Behavior During Therapy WFL for tasks assessed/performed               Past Medical History:  Diagnosis Date   Abdominal pain    Acid reflux    Chronic diarrhea    DDD (degenerative disc disease)    Depression    Diabetes mellitus without complication (HCC)    diet controlled   Dysphagia    Fibromyalgia    GERD (gastroesophageal reflux disease)    Hypertension    Meningioma (HCC)    PONV (postoperative nausea and vomiting)    nausea, pt must not vomit due to reflux surgery   Past Surgical History:  Procedure Laterality Date   ABDOMINAL HYSTERECTOMY  age 78   ANTERIOR AND POSTERIOR REPAIR N/A 01/18/2013   Procedure: ANTERIOR (CYSTOCELE) ;  Surgeon: Reece Packer, MD;  Location: [facility];  Service: Urology;  Laterality: N/A;   APPENDECTOMY  age 11   both ovaries removed   BREAST SURGERY Left 21 yrs ago   benign lump removed   CHOLECYSTECTOMY     COLONOSCOPY  11/11/07   NUR   CYSTOSCOPY N/A 01/18/2013   Procedure: CYSTOSCOPY;  Surgeon: Reece Packer, MD;  Location: [facility];  Service: Urology;  Laterality: N/A;   EYE SURGERY     lasix eye surgery Bilateral    PROCTOSCOPY  01/18/2013   Procedure: PROCTOSCOPY;  Surgeon: Reece Packer, MD;  Location: [facility];  Service: Urology;;   surgery  for acid reflux  1999    TONSILLECTOMY     TRANSFORAMINAL LUMBAR INTERBODY FUSION (TLIF) WITH PEDICLE SCREW FIXATION 1 LEVEL Left 04/04/2020   Procedure: Left Lumbar four-five Transforaminal lumbar interbody fusion;  Surgeon: Erline Levine, MD;  Location: [facility];  Service: Neurosurgery;  Laterality: Left;   UPPER GASTROINTESTINAL ENDOSCOPY  2006   Patient Active Problem List   Diagnosis Date Noted   Primary osteoarthritis of right hip 07/16/2020   Acute right hip pain 06/27/2020   Elevated blood-pressure reading, without diagnosis of hypertension 04/25/2020   Spondylolisthesis of lumbar region 04/04/2020   Chronic bilateral low back pain with left-sided sciatica 03/05/2020   Herniated nucleus pulposus, lumbar 03/05/2020   Other chronic pain 03/05/2020   Benign meningioma ([place]) 02/14/2019   Body mass index (BMI) 29.0-29.9, adult 02/14/2019   Dyspareunia in female 01/04/2019   Cerebral meningioma ([place]) 08/10/2013   Unspecified hereditary and idiopathic peripheral neuropathy 07/07/2013   Memory changes 07/07/2013   Type II or unspecified type diabetes mellitus without mention of complication, not stated as uncontrolled 07/07/2013   Unspecified essential hypertension 07/07/2013    PCP: Sharilyn Sites, MD  REFERRING PROVIDER: Tyson Dense, DPM  REFERRING DIAG: [phone] (ICD-10-CM) - Nontraumatic tear  of left tibialis posterior tendon M79.89 (ICD-10-CM) - Mass of soft tissue of foot Z98.890 (ICD-10-CM) - Status post left foot surgery   THERAPY DIAG:  Pain in left ankle and joints of left foot  Difficulty in walking, not elsewhere classified  Other abnormalities of gait and mobility  Rationale for Evaluation and Treatment Rehabilitation  ONSET DATE: Oct 2022  SUBJECTIVE:   SUBJECTIVE STATEMENT: Patient feels like she will be ready to be done soon. Still having trouble with balance, standing on 1 leg. Has trouble with steps still.    PERTINENT HISTORY: S/P Lt posterior tibialis tendon repain 01/2021; Reports  hx of lumbar fusion  PAIN:  Are you having pain? Yes: NPRS scale: 0/10 Pain location: left medial ankle Pain description: stiffness Aggravating factors: walking, stairs Relieving factors: rest  PRECAUTIONS: None  WEIGHT BEARING RESTRICTIONS No  FALLS:  Has patient fallen in last 6 months? No  LIVING ENVIRONMENT: Lives with: lives with their spouse Lives in: House/apartment Stairs: Yes: External: 1 steps; none Has following equipment at home: None  OCCUPATION: Retired  PLOF: Independent  PATIENT GOALS Reduce pain, walk at least 30 minutes for exercise because of back issues, navigate stairs confidently.    OBJECTIVE:   DIAGNOSTIC FINDINGS:  IMPRESSION 1. No internal derangement of the left ankle. 2. Prominence of the subcutaneous fat along the anteromedial aspect of the ankle at the site of clinical palpable abnormality without a defined encapsulated mass or fluid collection.  PATIENT SURVEYS:  FOTO 43%  COGNITION:  Overall cognitive status: Within functional limits for tasks assessed     SENSATION: WFL  LOWER EXTREMITY MMT:   MMT Right eval Left eval  Hip flexion 5 4  Hip extension    Hip abduction 5 5  Hip adduction    Hip internal rotation    Hip external rotation    Knee flexion 4+ 4+  Knee extension 5 5  Ankle dorsiflexion 5 5  Ankle plantarflexion    Ankle inversion 4+ 4  Ankle eversion 5 4   (Blank rows = not tested)  LOWER EXTREMITY ROM:   Active ROM Right eval Left eval  Hip flexion    Hip extension    Hip abduction    Hip adduction    Hip internal rotation    Hip external rotation    Knee flexion    Knee extension    Ankle dorsiflexion 11 knee ext 5 knee flex  -5 knee ext 1 knee flex  Ankle plantarflexion 30 30  Ankle inversion 14 11  Ankle eversion 5 4   (Blank rows = not tested)  LOWER EXTREMITY SPECIAL TESTS:  No overt laxity noted. Restricted talocrural motion with AP and PA glides and mildly  painful.  FUNCTIONAL TESTS:  5 times sit to stand: 11.5 sec 2 minute walk test: 356 feet no AD  GAIT: Distance walked: 356' Assistive device utilized: None Level of assistance: Complete Independence Comments: rapid forefoot slap at initial heel strike, narrow BOS, intermittent stagger towards left, early heel off at terminal stance    TODAY'S TREATMENT: 01/13/22 Bike seat 7;  5 minutes level 2 Slant board stretch 3X30" each Heel raises on incline with UE assist 20X Toe raises on incline with UE assist 20X Stairs 7 inch 5RT alternating  Step up and over 2x 10 LLE Lunge 2x 10 LLE Rockerboard 2 x 1 minute lateral, 2 x 1 minute A/P Lateral stepping 3x 15 feet bilateral   01/08/22 Bike seat 7;  5 minutes  level 2 Standing:  Heel raises on incline with UE assist 20X  Toe raises on incline with UE assist 20X  Slant board stretch 3X30" each  Mini squats 20X in good form  8 inch step ups without UE assist forward  Balance board F/B, inv/ev 10X each without UE assist Balance tandem and sidestepping on blue line 2RT each Manual: supine to Lt medial ankle/scar massage and PROM 10'   01/07/22 Bike seat 8 x 5' Heel toe raises 2 x 10 Slant board 5 x 20" Mini squats x 20 Tandem stance 2 x 30" each 8" step up x 10 without UE assist Balance board F/B, Inv/Ev x 10 reps each  Manual:  supine to medial ankle/scar massage, PROM x 10'   PATIENT EDUCATION:  Education details: 01/13/22: HEP; EVAL: Findings, HEP, Role of PT, symptom awareness and activity modifications Person educated: Patient Education method: Explanation Education comprehension: verbalized understanding   HOME EXERCISE PROGRAM: Access Code: [email or web address] 01/02/22 - Step Up  - 1 x daily - 7 x weekly - 2 sets - 10 reps - Lateral Step Up (Mirrored)  - 1 x daily - 7 x weekly - 2 sets - 10 reps  12/19/21 - Standing Tandem Balance with Counter Support (Mirrored)  - 3 x daily - 7 x weekly - 2 sets - 20 seconds hold  Date:  12/17/2021 Exercises - Long Sitting Ankle Eversion with Resistance  - 3 x daily - 7 x weekly - 1 sets - 10 reps - Long Sitting Ankle Inversion with Resistance  - 3 x daily - 7 x weekly - 1 sets - 10 reps - Seated Ankle Dorsiflexion with Resistance  - 3 x daily - 7 x weekly - 1 sets - 10 reps - Gastroc Stretch on Wall  - 3 x daily - 7 x weekly - 1 sets - 30 sec hold - Standing Ankle Dorsiflexion Stretch on Chair  - 3 x daily - 7 x weekly - 1 sets - 10 reps - 3 sec hold hold  ASSESSMENT:  CLINICAL IMPRESSION: Began session on bike for dynamic warm up and conditioning. Patient able to complete stairs with reciprocal pattern with UE use PRN. Added step up and overs for improving strength, motor control and confidence with stairs/curbs at home and in the community. Moderate fatigue at end of session. Patient will continue to benefit from physical therapy in order to improve function and reduce impairment.   OBJECTIVE IMPAIRMENTS Abnormal gait, decreased activity tolerance, decreased balance, decreased endurance, decreased knowledge of condition, decreased mobility, difficulty walking, decreased ROM, decreased strength, hypomobility, increased fascial restrictions, impaired flexibility, and pain.   ACTIVITY LIMITATIONS carrying, standing, stairs, and locomotion level  PARTICIPATION LIMITATIONS: cleaning, laundry, shopping, community activity, and yard work  PERSONAL FACTORS Age, Fitness, and Time since onset of injury/illness/exacerbation are also affecting patient's functional outcome.   REHAB POTENTIAL: Excellent  CLINICAL DECISION MAKING: Stable/uncomplicated  EVALUATION COMPLEXITY: Low   GOALS: Goals reviewed with patient? Yes  SHORT TERM GOALS: Target date: 01/07/22   Patient will be independent with initial HEP and self-management strategies to improve functional outcomes Baseline: initiated Goal status: IN PROGRESS    LONG TERM GOALS: Target date: 01/28/22  Patient will be  independent with advanced HEP and self-management strategies to improve functional outcomes Baseline: n/a Goal status: IN PROGRESS  2.  Patient will improve FOTO score to predicted value of 58% to indicate improvement in functional outcomes Baseline: 43% Goal status: IN PROGRESS  3.  Patient  will have Lt ankle AROM  dorsiflexion within 5 degrees of non-affected ankle to improve functional mobility and facilitate improved gait and stair navigation. Baseline: see above Goal status: IN PROGRESS  4. Patient will have equal to or > 4+/5 MMT throughout Bil LEs to improve ability to perform functional mobility, stair ambulation and ADLs.  Baseline: see above Goal status: IN PROGRESS  5. Patient will be able to ambulate at least 400 feet during 2MWT with LRAD to demonstrate improved ability to perform functional mobility and associated tasks. Baseline: 356 feet Goal status: IN PROGRESS    PLAN: PT FREQUENCY: 2x/week  PT DURATION: 6 weeks  PLANNED INTERVENTIONS: Therapeutic exercises, Therapeutic activity, Neuromuscular re-education, Balance training, Gait training, Patient/Family education, Self Care, Joint mobilization, Joint manipulation, Stair training, DME instructions, Dry Needling, Cryotherapy, Moist heat, Taping, Ultrasound, Ionotophoresis '4mg'$ /ml Dexamethasone, Manual therapy, and Re-evaluation  PLAN FOR NEXT SESSION: Manual therapy - left ankle ROM, gradual strengthening of ankle mm as range improves. Balance, gait, and stair training to improve mechanics.  3:11 PM, 01/13/22 Mearl Latin PT, DPT Physical Therapist at [facility]

## 2022-01-14 DIAGNOSIS — D329 Benign neoplasm of meninges, unspecified: Secondary | ICD-10-CM | POA: Diagnosis not present

## 2022-01-14 DIAGNOSIS — G319 Degenerative disease of nervous system, unspecified: Secondary | ICD-10-CM | POA: Diagnosis not present

## 2022-01-16 ENCOUNTER — Encounter (HOSPITAL_COMMUNITY): Payer: Self-pay | Admitting: Physical Therapy

## 2022-01-16 ENCOUNTER — Ambulatory Visit (HOSPITAL_COMMUNITY): Payer: Medicare PPO | Admitting: Physical Therapy

## 2022-01-16 DIAGNOSIS — M25572 Pain in left ankle and joints of left foot: Secondary | ICD-10-CM | POA: Diagnosis not present

## 2022-01-16 DIAGNOSIS — R2689 Other abnormalities of gait and mobility: Secondary | ICD-10-CM

## 2022-01-16 DIAGNOSIS — R262 Difficulty in walking, not elsewhere classified: Secondary | ICD-10-CM

## 2022-01-16 NOTE — Therapy (Signed)
OUTPATIENT PHYSICAL THERAPY LOWER EXTREMITY TREATMENT   Patient Name: Mindy Ellis MRN: [id number] DOB:1950/11/02, 71 y.o., female Today's Date: 01/16/2022 PHYSICAL THERAPY DISCHARGE SUMMARY  Visits from Start of Care: 10  Current functional level related to goals / functional outcomes: See below   Remaining deficits: See below   Education / Equipment: See below   Patient agrees to discharge. Patient goals were met. Patient is being discharged due to meeting the stated rehab goals.    PT End of Session - 01/16/22 1036     Visit Number 10    Number of Visits 12    Date for PT Re-Evaluation 01/28/22    Authorization Type [employer] Choice (secondary [employer])    Authorization Time Period Approved 12 visits 9/12-10/24    Authorization - Visit Number 9    Authorization - Number of Visits 12    Progress Note Due on Visit 10    PT Start Time 1036    PT Stop Time 1109    PT Time Calculation (min) 33 min    Activity Tolerance Patient tolerated treatment well    Behavior During Therapy WFL for tasks assessed/performed               Past Medical History:  Diagnosis Date   Abdominal pain    Acid reflux    Chronic diarrhea    DDD (degenerative disc disease)    Depression    Diabetes mellitus without complication (HCC)    diet controlled   Dysphagia    Fibromyalgia    GERD (gastroesophageal reflux disease)    Hypertension    Meningioma (HCC)    PONV (postoperative nausea and vomiting)    nausea, pt must not vomit due to reflux surgery   Past Surgical History:  Procedure Laterality Date   ABDOMINAL HYSTERECTOMY  age 57   ANTERIOR AND POSTERIOR REPAIR N/A 01/18/2013   Procedure: ANTERIOR (CYSTOCELE) ;  Surgeon: Reece Packer, MD;  [facility]: [facility];  Service: Urology;  Laterality: N/A;   APPENDECTOMY  age 47   both ovaries removed   BREAST SURGERY Left 21 yrs ago   benign lump removed   CHOLECYSTECTOMY     COLONOSCOPY  11/11/07   NUR   CYSTOSCOPY  N/A 01/18/2013   Procedure: CYSTOSCOPY;  Surgeon: Reece Packer, MD;  [facility];  Service: Urology;  Laterality: N/A;   EYE SURGERY     lasix eye surgery Bilateral    PROCTOSCOPY  01/18/2013   Procedure: PROCTOSCOPY;  Surgeon: Reece Packer, MD;  [facility];  Service: Urology;;   surgery  for acid reflux  1999   TONSILLECTOMY     TRANSFORAMINAL LUMBAR INTERBODY FUSION (TLIF) WITH PEDICLE SCREW FIXATION 1 LEVEL Left 04/04/2020   Procedure: Left Lumbar four-five Transforaminal lumbar interbody fusion;  Surgeon: Erline Levine, MD;  Location: [facility];  Service: Neurosurgery;  Laterality: Left;   UPPER GASTROINTESTINAL ENDOSCOPY  2006   Patient Active Problem List   Diagnosis Date Noted   Primary osteoarthritis of right hip 07/16/2020   Acute right hip pain 06/27/2020   Elevated blood-pressure reading, without diagnosis of hypertension 04/25/2020   Spondylolisthesis of lumbar region 04/04/2020   Chronic bilateral low back pain with left-sided sciatica 03/05/2020   Herniated nucleus pulposus, lumbar 03/05/2020   Other chronic pain 03/05/2020   Benign meningioma ([place]) 02/14/2019   Body mass index (BMI) 29.0-29.9, adult 02/14/2019   Dyspareunia in female 01/04/2019   Cerebral meningioma ([place]) 08/10/2013  Unspecified hereditary and idiopathic peripheral neuropathy 07/07/2013   Memory changes 07/07/2013   Type II or unspecified type diabetes mellitus without mention of complication, not stated as uncontrolled 07/07/2013   Unspecified essential hypertension 07/07/2013    PCP: Sharilyn Sites, MD  REFERRING PROVIDER: Tyson Dense, DPM  REFERRING DIAG: [phone] (ICD-10-CM) - Nontraumatic tear of left tibialis posterior tendon M79.89 (ICD-10-CM) - Mass of soft tissue of foot Z98.890 (ICD-10-CM) - Status post left foot surgery   THERAPY DIAG:  Pain in left ankle and joints of left foot  Difficulty in walking, not elsewhere classified  Other abnormalities of gait and  mobility  Rationale for Evaluation and Treatment Rehabilitation  ONSET DATE: Oct 2022  SUBJECTIVE:   SUBJECTIVE STATEMENT: Patient states calf was sore after last time. Sore spot on foot still. Been trying to massage it. HEP is going well. Bands have gotten really easy. Patient states at least 85% improvement with PT intervention. Walking around at home has been better.   PERTINENT HISTORY: S/P Lt posterior tibialis tendon repain 01/2021; Reports hx of lumbar fusion  PAIN:  Are you having pain? Yes: NPRS scale: 0/10 Pain location: left medial ankle Pain description: stiffness Aggravating factors: walking, stairs Relieving factors: rest  PRECAUTIONS: None  WEIGHT BEARING RESTRICTIONS No  FALLS:  Has patient fallen in last 6 months? No  LIVING ENVIRONMENT: Lives with: lives with their spouse Lives in: House/apartment Stairs: Yes: External: 1 steps; none Has following equipment at home: None  OCCUPATION: Retired  PLOF: Independent  PATIENT GOALS Reduce pain, walk at least 30 minutes for exercise because of back issues, navigate stairs confidently.    OBJECTIVE:   DIAGNOSTIC FINDINGS:  IMPRESSION 1. No internal derangement of the left ankle. 2. Prominence of the subcutaneous fat along the anteromedial aspect of the ankle at the site of clinical palpable abnormality without a defined encapsulated mass or fluid collection.  PATIENT SURVEYS:  FOTO 43%  01/16/22: 76% function  COGNITION:  Overall cognitive status: Within functional limits for tasks assessed     SENSATION: [facility]  LOWER EXTREMITY MMT:   MMT Right eval Left eval Right 01/16/22 Left  01/16/22  Hip flexion 5 4 5 5   Hip extension      Hip abduction 5 5 5 5   Hip adduction      Hip internal rotation      Hip external rotation      Knee flexion 4+ 4+ 5 5  Knee extension 5 5 5 5   Ankle dorsiflexion 5 5 5 5   Ankle plantarflexion      Ankle inversion 4+ 4 5 4+  Ankle eversion 5 4 5  4+   (Blank  rows = not tested)  LOWER EXTREMITY ROM:   Active ROM Right eval Left eval Right 01/16/22 Left 01/16/22  Hip flexion      Hip extension      Hip abduction      Hip adduction      Hip internal rotation      Hip external rotation      Knee flexion      Knee extension      Ankle dorsiflexion 11 knee ext 5 knee flex  -5 knee ext 1 knee flex 11 7  Ankle plantarflexion 30 30 38 33  Ankle inversion 14 11 34 28  Ankle eversion 5 4 18 17    (Blank rows = not tested)  LOWER EXTREMITY SPECIAL TESTS:  No overt laxity noted. Restricted talocrural motion with AP and PA  glides and mildly painful.  FUNCTIONAL TESTS:  5 times sit to stand: 11.5 sec 2 minute walk test: 356 feet no AD  GAIT: Distance walked: 356' Assistive device utilized: None Level of assistance: Complete Independence Comments: rapid forefoot slap at initial heel strike, narrow BOS, intermittent stagger towards left, early heel off at terminal stance Reassessment 01/16/22 FUNCTIONAL TESTS:  5 times sit to stand: 7.54 sec without UE support 2 minute walk test: 415 feet no AD  GAIT: Distance walked: 415 feet Assistive device utilized: None Level of assistance: Complete Independence Comments: 2 MWT, decreased L glute muscle mass with slight antalgic on L stance  TODAY'S TREATMENT: 01/16/22 Bike seat 7;  5 minutes level 4 Reassessment Sidelying hip abduction RLE x 10  01/13/22 Bike seat 7;  5 minutes level 2 Slant board stretch 3X30" each Heel raises on incline with UE assist 20X Toe raises on incline with UE assist 20X Stairs 7 inch 5RT alternating  Step up and over 2x 10 LLE Lunge 2x 10 LLE Rockerboard 2 x 1 minute lateral, 2 x 1 minute A/P Lateral stepping 3x 15 feet bilateral   01/08/22 Bike seat 7;  5 minutes level 2 Standing:  Heel raises on incline with UE assist 20X  Toe raises on incline with UE assist 20X  Slant board stretch 3X30" each  Mini squats 20X in good form  8 inch step ups without  UE assist forward  Balance board F/B, inv/ev 10X each without UE assist Balance tandem and sidestepping on blue line 2RT each Manual: supine to Lt medial ankle/scar massage and PROM 10'   01/07/22 Bike seat 8 x 5' Heel toe raises 2 x 10 Slant board 5 x 20" Mini squats x 20 Tandem stance 2 x 30" each 8" step up x 10 without UE assist Balance board F/B, Inv/Ev x 10 reps each  Manual:  supine to medial ankle/scar massage, PROM x 10'   PATIENT EDUCATION:  Education details: 01/16/22: HEP, reassessment findings, returning to PT if needed; 01/13/22: HEP; EVAL: Findings, HEP, Role of PT, symptom awareness and activity modifications Person educated: Patient Education method: Explanation Education comprehension: verbalized understanding   HOME EXERCISE PROGRAM: Access Code: [email or web address] 01/16/22 - Squat with Counter Support  - 1 x daily - 7 x weekly - 3 sets - 10 reps - Standing Hip Abduction with Counter Support  - 1 x daily - 7 x weekly - 3 sets - 10 reps - Sidelying Hip Abduction  - 1 x daily - 7 x weekly - 3 sets - 10 reps  01/02/22 - Step Up  - 1 x daily - 7 x weekly - 2 sets - 10 reps - Lateral Step Up (Mirrored)  - 1 x daily - 7 x weekly - 2 sets - 10 reps  12/19/21 - Standing Tandem Balance with Counter Support (Mirrored)  - 3 x daily - 7 x weekly - 2 sets - 20 seconds hold  Date: 12/17/2021 Exercises - Long Sitting Ankle Eversion with Resistance  - 3 x daily - 7 x weekly - 1 sets - 10 reps - Long Sitting Ankle Inversion with Resistance  - 3 x daily - 7 x weekly - 1 sets - 10 reps - Seated Ankle Dorsiflexion with Resistance  - 3 x daily - 7 x weekly - 1 sets - 10 reps - Gastroc Stretch on Wall  - 3 x daily - 7 x weekly - 1 sets - 30 sec hold - Standing Ankle Dorsiflexion  Stretch on Chair  - 3 x daily - 7 x weekly - 1 sets - 10 reps - 3 sec hold hold  ASSESSMENT:  CLINICAL IMPRESSION: Patient has met all short term goals and long term goals with ability to complete HEP and  improvement in symptoms, strength, ROM, activity tolerance, gait, balance, and functional mobility. Remaining deficits include end range ROM, intermittent symptoms.  Patient with slight antalgic gait on LLE stance which is likely due to end range DF limitation and impaired L gluteal strength. Patient discharged from physical therapy at this time.  OBJECTIVE IMPAIRMENTS Abnormal gait, decreased activity tolerance, decreased balance, decreased endurance, decreased knowledge of condition, decreased mobility, difficulty walking, decreased ROM, decreased strength, hypomobility, increased fascial restrictions, impaired flexibility, and pain.   ACTIVITY LIMITATIONS carrying, standing, stairs, and locomotion level  PARTICIPATION LIMITATIONS: cleaning, laundry, shopping, community activity, and yard work  PERSONAL FACTORS Age, Fitness, and Time since onset of injury/illness/exacerbation are also affecting patient's functional outcome.   REHAB POTENTIAL: Excellent  CLINICAL DECISION MAKING: Stable/uncomplicated  EVALUATION COMPLEXITY: Low   GOALS: Goals reviewed with patient? Yes  SHORT TERM GOALS: Target date: 01/07/22   Patient will be independent with initial HEP and self-management strategies to improve functional outcomes Baseline: initiated Goal status: MET    LONG TERM GOALS: Target date: 01/28/22  Patient will be independent with advanced HEP and self-management strategies to improve functional outcomes Baseline: n/a Goal status: MET  2.  Patient will improve FOTO score to predicted value of 58% to indicate improvement in functional outcomes Baseline: 43% 10/12: 72% function Goal status: MET  3.  Patient will have Lt ankle AROM  dorsiflexion within 5 degrees of non-affected ankle to improve functional mobility and facilitate improved gait and stair navigation. Baseline: see above Goal status: MET  4. Patient will have equal to or > 4+/5 MMT throughout Bil LEs to improve ability  to perform functional mobility, stair ambulation and ADLs.  Baseline: see above Goal status: MET  5. Patient will be able to ambulate at least 400 feet during 2MWT with LRAD to demonstrate improved ability to perform functional mobility and associated tasks. Baseline: 356 feet 01/16/22: 415 feet Goal status: MET    PLAN: PT FREQUENCY: 2x/week  PT DURATION: 6 weeks  PLANNED INTERVENTIONS: Therapeutic exercises, Therapeutic activity, Neuromuscular re-education, Balance training, Gait training, Patient/Family education, Self Care, Joint mobilization, Joint manipulation, Stair training, DME instructions, Dry Needling, Cryotherapy, Moist heat, Taping, Ultrasound, Ionotophoresis 4mg /ml Dexamethasone, Manual therapy, and Re-evaluation  PLAN FOR NEXT SESSION: n/a  11:15 AM, 01/16/22 Mearl Latin PT, DPT Physical Therapist at [facility]

## 2022-01-21 ENCOUNTER — Encounter (HOSPITAL_COMMUNITY): Payer: Medicare PPO | Admitting: Physical Therapy

## 2022-01-23 ENCOUNTER — Encounter (HOSPITAL_COMMUNITY): Payer: Medicare PPO | Admitting: Physical Therapy

## 2022-01-27 DIAGNOSIS — Z6830 Body mass index (BMI) 30.0-30.9, adult: Secondary | ICD-10-CM | POA: Diagnosis not present

## 2022-01-27 DIAGNOSIS — D329 Benign neoplasm of meninges, unspecified: Secondary | ICD-10-CM | POA: Diagnosis not present

## 2022-01-28 ENCOUNTER — Encounter (HOSPITAL_COMMUNITY): Payer: Medicare PPO

## 2022-01-30 ENCOUNTER — Encounter (HOSPITAL_COMMUNITY): Payer: Medicare PPO

## 2022-01-31 ENCOUNTER — Encounter (HOSPITAL_COMMUNITY): Payer: Medicare PPO

## 2022-04-15 ENCOUNTER — Encounter: Payer: Self-pay | Admitting: Podiatry

## 2022-04-15 ENCOUNTER — Ambulatory Visit (INDEPENDENT_AMBULATORY_CARE_PROVIDER_SITE_OTHER): Payer: Medicare PPO | Admitting: Podiatry

## 2022-04-15 DIAGNOSIS — G5782 Other specified mononeuropathies of left lower limb: Secondary | ICD-10-CM | POA: Diagnosis not present

## 2022-04-15 DIAGNOSIS — G5762 Lesion of plantar nerve, left lower limb: Secondary | ICD-10-CM | POA: Diagnosis not present

## 2022-04-15 MED ORDER — TRIAMCINOLONE ACETONIDE 40 MG/ML IJ SUSP
20.0000 mg | Freq: Once | INTRAMUSCULAR | Status: AC
  Administered 2022-04-15: 20 mg

## 2022-04-15 NOTE — Progress Notes (Signed)
She presents today for follow-up of her neuroma third interspace left foot.  States the last time she was in we started her on the ethyl alcohol injection.  Objective: Vital signs stable she alert oriented x 3.  She has strong palpable pulses but she does have a palpable Mulder's click third interspace of the left foot.  Assessment: Neuroma third interspace left foot.  Plan: I injected the left third interdigital space today with 10 mg Kenalog 5 mg Marcaine.  Should this not alleviate her symptoms we will start her on dehydrated alcohol in 3 to 4 weeks.

## 2022-05-13 ENCOUNTER — Ambulatory Visit: Payer: Medicare PPO | Admitting: Podiatry

## 2022-05-16 DIAGNOSIS — K589 Irritable bowel syndrome without diarrhea: Secondary | ICD-10-CM | POA: Diagnosis not present

## 2022-05-16 DIAGNOSIS — G4730 Sleep apnea, unspecified: Secondary | ICD-10-CM | POA: Diagnosis not present

## 2022-05-16 DIAGNOSIS — M5136 Other intervertebral disc degeneration, lumbar region: Secondary | ICD-10-CM | POA: Diagnosis not present

## 2022-05-16 DIAGNOSIS — F419 Anxiety disorder, unspecified: Secondary | ICD-10-CM | POA: Diagnosis not present

## 2022-05-16 DIAGNOSIS — E1159 Type 2 diabetes mellitus with other circulatory complications: Secondary | ICD-10-CM | POA: Diagnosis not present

## 2022-05-16 DIAGNOSIS — R946 Abnormal results of thyroid function studies: Secondary | ICD-10-CM | POA: Diagnosis not present

## 2022-05-16 DIAGNOSIS — E559 Vitamin D deficiency, unspecified: Secondary | ICD-10-CM | POA: Diagnosis not present

## 2022-05-16 DIAGNOSIS — I10 Essential (primary) hypertension: Secondary | ICD-10-CM | POA: Diagnosis not present

## 2022-05-16 DIAGNOSIS — Z6830 Body mass index (BMI) 30.0-30.9, adult: Secondary | ICD-10-CM | POA: Diagnosis not present

## 2022-05-16 DIAGNOSIS — E785 Hyperlipidemia, unspecified: Secondary | ICD-10-CM | POA: Diagnosis not present

## 2022-05-19 ENCOUNTER — Other Ambulatory Visit (HOSPITAL_COMMUNITY): Payer: Self-pay | Admitting: Family Medicine

## 2022-05-19 DIAGNOSIS — E785 Hyperlipidemia, unspecified: Secondary | ICD-10-CM

## 2022-05-19 DIAGNOSIS — E1140 Type 2 diabetes mellitus with diabetic neuropathy, unspecified: Secondary | ICD-10-CM

## 2022-05-20 ENCOUNTER — Ambulatory Visit: Payer: Medicare PPO | Admitting: Podiatry

## 2022-05-27 ENCOUNTER — Ambulatory Visit: Payer: Medicare PPO | Admitting: Podiatry

## 2022-06-10 ENCOUNTER — Ambulatory Visit (INDEPENDENT_AMBULATORY_CARE_PROVIDER_SITE_OTHER): Payer: Medicare PPO | Admitting: Podiatry

## 2022-06-10 DIAGNOSIS — G5782 Other specified mononeuropathies of left lower limb: Secondary | ICD-10-CM | POA: Diagnosis not present

## 2022-06-10 NOTE — Progress Notes (Signed)
She presents today for follow-up of her neuroma and her arch pain.  She states that its about 2 out of 10 at this point.  She has pain on palpation right and here she says.  Objective: Vital signs stable alert oriented x 3 she has pain in relation to the distal third interdigital space left foot.  Assessment: Neuroma third interspace left.  Plan: At this point I injected her first dose of dehydrated alcohol after sterile Betadine skin prep tolerated procedure well I will follow-up with her in 1 month

## 2022-06-11 DIAGNOSIS — M4316 Spondylolisthesis, lumbar region: Secondary | ICD-10-CM | POA: Diagnosis not present

## 2022-06-11 DIAGNOSIS — Z6829 Body mass index (BMI) 29.0-29.9, adult: Secondary | ICD-10-CM | POA: Diagnosis not present

## 2022-06-30 ENCOUNTER — Ambulatory Visit
Admission: RE | Admit: 2022-06-30 | Discharge: 2022-06-30 | Disposition: A | Discharge status: Home / Self Care | Payer: Medicare PPO | Source: Ambulatory Visit | Attending: Nurse Practitioner | Admitting: Nurse Practitioner

## 2022-06-30 ENCOUNTER — Other Ambulatory Visit (HOSPITAL_COMMUNITY): Payer: Medicare PPO

## 2022-06-30 VITALS — BP 152/90 | HR 92 | Temp 98.5°F | Resp 18

## 2022-06-30 DIAGNOSIS — M5441 Lumbago with sciatica, right side: Secondary | ICD-10-CM

## 2022-06-30 MED ORDER — TIZANIDINE HCL 2 MG PO TABS: 2.0000 mg | ORAL_TABLET | Freq: Three times a day (TID) | ORAL | 0 refills | Status: DC | PRN

## 2022-06-30 MED ORDER — DEXAMETHASONE SODIUM PHOSPHATE 10 MG/ML IJ SOLN
10.0000 mg | Freq: Once | INTRAMUSCULAR | Status: AC
  Administered 2022-06-30: 10 mg via INTRAMUSCULAR

## 2022-06-30 NOTE — ED Triage Notes (Signed)
Pt states she bent over Friday night and has been having pain in her back that goes down her right and left leg when trying to move. Taking diclofenac potassium  but no relief of symptoms.

## 2022-06-30 NOTE — Discharge Instructions (Signed)
We have given you a steroid shot today to help with pain and inflammation  Start taking the muscle relaxants to help relax your muscles.  You can continue to take Tylenol [phone] mg every 6 hours as needed for pain.  Also start using lidocaine patches to help with pain.    Follow up with PCP or Spine Specialist if symptoms persist or worsen despite treatment.

## 2022-06-30 NOTE — ED Provider Notes (Signed)
[facility]    CSN: [id number] Arrival date & time: 06/30/22  1515      History   Chief Complaint Chief Complaint  Patient presents with   Back Pain    Bent over Friday night.  Been in pain since then. Can only walk using a walker. - Entered by patient    HPI Mindy Ellis is a 72 y.o. female.   Patient presents for back pain that started suddenly Friday when she bent down to pick something up.  Denies recent, accident, fall, or known injury to the back.  Reports that she has a history of sciatica, has had a ruptured disc and has had an L4-L5 fusion.  She reports the pain is moderate and aching at rest.  When she tries to stand or move, the pain is severe and shoots down her right buttock.  No numbness or tingling in her toes, decreased sensation in her legs, or weakness.  No saddle anesthesia, new bowel or bladder incontinence, fever, nausea/vomiting, dysuria/urinary frequency since back pain began.  Has tried diclofenac orally, heat, and husband's muscle relaxant with minimal improvement.        Past Medical History:  Diagnosis Date   Abdominal pain    Acid reflux    Chronic diarrhea    DDD (degenerative disc disease)    Depression    Diabetes mellitus without complication (HCC)    diet controlled   Dysphagia    Fibromyalgia    GERD (gastroesophageal reflux disease)    Hypertension    Meningioma (HCC)    PONV (postoperative nausea and vomiting)    nausea, pt must not vomit due to reflux surgery    Patient Active Problem List   Diagnosis Date Noted   Primary osteoarthritis of right hip 07/16/2020   Acute right hip pain 06/27/2020   Elevated blood-pressure reading, without diagnosis of hypertension 04/25/2020   Spondylolisthesis of lumbar region 04/04/2020   Chronic bilateral low back pain with left-sided sciatica 03/05/2020   Herniated nucleus pulposus, lumbar 03/05/2020   Other chronic pain 03/05/2020   Benign meningioma ([place]) 02/14/2019   Body  mass index (BMI) 29.0-29.9, adult 02/14/2019   Dyspareunia in female 01/04/2019   Cerebral meningioma ([place]) 08/10/2013   Unspecified hereditary and idiopathic peripheral neuropathy 07/07/2013   Memory changes 07/07/2013   Type II or unspecified type diabetes mellitus without mention of complication, not stated as uncontrolled 07/07/2013   Unspecified essential hypertension 07/07/2013    Past Surgical History:  Procedure Laterality Date   ABDOMINAL HYSTERECTOMY  age 38   ANTERIOR AND POSTERIOR REPAIR N/A 01/18/2013   Procedure: ANTERIOR (CYSTOCELE) ;  Surgeon: Reece Packer, MD;  Location: [facility];  Service: Urology;  Laterality: N/A;   APPENDECTOMY  age 12   both ovaries removed   BREAST SURGERY Left 21 yrs ago   benign lump removed   CHOLECYSTECTOMY     COLONOSCOPY  11/11/07   NUR   CYSTOSCOPY N/A 01/18/2013   Procedure: CYSTOSCOPY;  Surgeon: Reece Packer, MD;  Location: [facility];  Service: Urology;  Laterality: N/A;   EYE SURGERY     lasix eye surgery Bilateral    PROCTOSCOPY  01/18/2013   Procedure: PROCTOSCOPY;  Surgeon: Reece Packer, MD;  Location: [facility];  Service: Urology;;   surgery  for acid reflux  1999   TONSILLECTOMY     TRANSFORAMINAL LUMBAR INTERBODY FUSION (TLIF) WITH PEDICLE SCREW FIXATION 1 LEVEL Left 04/04/2020   Procedure: Left Lumbar  four-five Transforaminal lumbar interbody fusion;  Surgeon: Erline Levine, MD;  Location: [facility];  Service: Neurosurgery;  Laterality: Left;   UPPER GASTROINTESTINAL ENDOSCOPY  2006    OB History   No obstetric history on file.      Home Medications    Prior to Admission medications   Medication Sig Start Date End Date Taking? Authorizing Provider  amitriptyline (ELAVIL) 50 MG tablet Take 50 mg by mouth at bedtime.   Yes [provider]  Calcium Carbonate-Vitamin D (CALCIUM + D PO) Take 600 mg by mouth daily.   Yes [provider]  cholecalciferol (VITAMIN D3) 25 MCG (1000 UNIT) tablet Take  2,000 Units by mouth daily.   Yes [provider]  diltiazem (CARDIZEM CD) 180 MG 24 hr capsule Take 180 mg by mouth daily. 03/17/20  Yes [provider]  MAGNESIUM MALATE PO Take 1 tablet by mouth at bedtime.   Yes [provider]  methocarbamol (ROBAXIN) 500 MG tablet Take by mouth. 07/13/20  Yes [provider]  tiZANidine (ZANAFLEX) 2 MG tablet Take 1 tablet (2 mg total) by mouth every 8 (eight) hours as needed for muscle spasms. Do not take with alcohol or while driving or operating heavy machinery.  May cause drowsiness. 06/30/22  Yes Eulogio Bear, NP  traZODone (DESYREL) 100 MG tablet Take 100 mg by mouth at bedtime.   Yes [provider]  Nitrofurantoin Monohyd Macro (MACROBID PO) Take by mouth.    [provider]  nitroGLYCERIN (NITROSTAT) 0.4 MG SL tablet Place 1 tablet (0.4 mg total) under the tongue every 5 (five) minutes as needed for chest pain. 11/02/19   Rolland Porter, MD  ondansetron (ZOFRAN) 4 MG tablet Take 1 tablet (4 mg total) by mouth every 8 (eight) hours as needed. 01/30/21   Hyatt, Max T, DPM  fexofenadine (ALLEGRA) 180 MG tablet Take by mouth daily.    06/22/11  [provider]  mirtazapine (REMERON) 15 MG tablet Take 15 mg by mouth at bedtime.    06/22/11  [provider]  omeprazole (PRILOSEC) 20 MG capsule Take 20 mg by mouth daily.    06/22/11  [provider]  promethazine (PHENERGAN) 25 MG tablet Take 25 mg by mouth every 6 (six) hours as needed.    06/22/11  [provider]    Family History Family History  Problem Relation Age of Onset   Drug abuse Other    Depression Mother    Asthma Mother    COPD Mother    Anxiety disorder Maternal Grandmother    Depression Maternal Grandmother    COPD Sister     Social History Social History   Tobacco Use   Smoking status: Never    Passive exposure: Never   Smokeless tobacco: Never  Vaping Use   Vaping Use: Never used   Substance Use Topics   Alcohol use: Yes    Comment: occ wine   Drug use: No     Allergies   Latex, Mirtazapine, Naproxen sodium, Other, Rofecoxib, Statins, Zolpidem tartrate, Elemental sulfur, Erythromycin, Penicillins, and Tetracyclines & related   Review of Systems Review of Systems Per HPI  Physical Exam Triage Vital Signs ED Triage Vitals  Enc Vitals Group     BP 06/30/22 1528 (!) 152/90     Pulse Rate 06/30/22 1528 92     Resp 06/30/22 1528 18     Temp 06/30/22 1528 98.5 F (36.9 C)     Temp Source 06/30/22  1528 Oral     SpO2 06/30/22 1528 96 %     Weight --      Height --      Head Circumference --      Peak Flow --      Pain Score 06/30/22 1529 4     Pain Loc --      Pain Edu? --      Excl. in [place]? --    No data found.  Updated Vital Signs BP (!) 152/90 (BP Location: Right Arm)   Pulse 92   Temp 98.5 F (36.9 C) (Oral)   Resp 18   SpO2 96%   Visual Acuity Right Eye Distance:   Left Eye Distance:   Bilateral Distance:    Right Eye Near:   Left Eye Near:    Bilateral Near:     Physical Exam Vitals and nursing note reviewed.  Constitutional:      General: She is not in acute distress.    Appearance: Normal appearance. She is not toxic-appearing.  HENT:     Mouth/Throat:     Mouth: Mucous membranes are moist.     Pharynx: Oropharynx is clear.  Pulmonary:     Effort: Pulmonary effort is normal. No respiratory distress.  Musculoskeletal:       Legs:     Comments: Inspection: No swelling, obvious deformity, or redness to right low back/right upper buttock Palpation: Right upper buttock tender to palpation in area marked; no obvious deformities palpated ROM: Full ROM to right and left hips, spine Strength: 5/5 bilateral lower extremities Neurovascular: neurovascularly intact in left and right lower extremity   Skin:    General: Skin is warm and dry.     Capillary Refill: Capillary refill takes less than 2 seconds.     Coloration: Skin is not  jaundiced or pale.     Findings: No erythema.  Neurological:     Mental Status: She is alert and oriented to person, place, and time.  Psychiatric:        Behavior: Behavior is cooperative.      UC Treatments / Results  Labs (all labs ordered are listed, but only abnormal results are displayed) Labs Reviewed - No data to display  EKG   Radiology No results found.  Procedures Procedures (including critical care time)  Medications Ordered in UC Medications  dexamethasone (DECADRON) injection 10 mg (10 mg Intramuscular Given 06/30/22 1545)    Initial Impression / Assessment and Plan / UC Course  I have reviewed the triage vital signs and the nursing notes.  Pertinent labs & imaging results that were available during my care of the patient were reviewed by me and considered in my medical decision making (see chart for details).   Patient is well-appearing, normotensive, afebrile, not tachycardic, not tachypneic, oxygenating well on room air.    1. Acute right-sided low back pain with right-sided sciatica No red flags in history or on exam today Vital signs are reassuring Decadron 10 mg IM given today in urgent care for pain and inflammation Start muscle accident medication, Tylenol 500 to 1000 mg every 6 hours as needed for pain, along with diclofenac Other supportive care discussed Recommended follow-up with PCP or spine specialist if symptoms persist or worsen despite treatment  The patient was given the opportunity to ask questions.  All questions answered to their satisfaction.  The patient is in agreement to this plan.   Final Clinical Impressions(s) / UC Diagnoses   Final diagnoses:  Acute right-sided low back pain with right-sided sciatica     Discharge Instructions      We have given you a steroid shot today to help with pain and inflammation  Start taking the muscle relaxants to help relax your muscles.  You can continue to take Tylenol [phone] mg every 6  hours as needed for pain.  Also start using lidocaine patches to help with pain.    Follow up with PCP or Spine Specialist if symptoms persist or worsen despite treatment.     ED Prescriptions     Medication Sig Dispense Auth. Provider   tiZANidine (ZANAFLEX) 2 MG tablet Take 1 tablet (2 mg total) by mouth every 8 (eight) hours as needed for muscle spasms. Do not take with alcohol or while driving or operating heavy machinery.  May cause drowsiness. 30 tablet Eulogio Bear, NP      PDMP not reviewed this encounter.   Eulogio Bear, NP 06/30/22 [phone]

## 2022-07-02 DIAGNOSIS — Z6830 Body mass index (BMI) 30.0-30.9, adult: Secondary | ICD-10-CM | POA: Diagnosis not present

## 2022-07-02 DIAGNOSIS — E6609 Other obesity due to excess calories: Secondary | ICD-10-CM | POA: Diagnosis not present

## 2022-07-02 DIAGNOSIS — M5136 Other intervertebral disc degeneration, lumbar region: Secondary | ICD-10-CM | POA: Diagnosis not present

## 2022-07-02 DIAGNOSIS — M797 Fibromyalgia: Secondary | ICD-10-CM | POA: Diagnosis not present

## 2022-07-02 DIAGNOSIS — M5431 Sciatica, right side: Secondary | ICD-10-CM | POA: Diagnosis not present

## 2022-07-04 ENCOUNTER — Other Ambulatory Visit (HOSPITAL_COMMUNITY): Payer: Self-pay | Admitting: Family Medicine

## 2022-07-04 DIAGNOSIS — Z1231 Encounter for screening mammogram for malignant neoplasm of breast: Secondary | ICD-10-CM

## 2022-07-05 ENCOUNTER — Emergency Department (HOSPITAL_COMMUNITY)
Admission: EM | Admit: 2022-07-05 | Discharge: 2022-07-05 | Disposition: A | Discharge status: Home / Self Care | Payer: Medicare PPO | Attending: Emergency Medicine | Admitting: Emergency Medicine

## 2022-07-05 DIAGNOSIS — M5440 Lumbago with sciatica, unspecified side: Secondary | ICD-10-CM

## 2022-07-05 DIAGNOSIS — Z91040 Latex allergy status: Secondary | ICD-10-CM | POA: Diagnosis not present

## 2022-07-05 DIAGNOSIS — M5441 Lumbago with sciatica, right side: Secondary | ICD-10-CM | POA: Diagnosis not present

## 2022-07-05 LAB — CBC WITH DIFFERENTIAL/PLATELET
Abs Immature Granulocytes: 0.14 10*3/uL — ABNORMAL HIGH (ref 0.00–0.07)
Basophils Absolute: 0 10*3/uL (ref 0.0–0.1)
Basophils Relative: 0 %
Eosinophils Absolute: 0.1 10*3/uL (ref 0.0–0.5)
Eosinophils Relative: 0 %
HCT: 43.8 % (ref 36.0–46.0)
Hemoglobin: 14.6 g/dL (ref 12.0–15.0)
Immature Granulocytes: 1 %
Lymphocytes Relative: 6 %
Lymphs Abs: 0.9 10*3/uL (ref 0.7–4.0)
MCH: 28 pg (ref 26.0–34.0)
MCHC: 33.3 g/dL (ref 30.0–36.0)
MCV: 83.9 fL (ref 80.0–100.0)
Monocytes Absolute: 0.4 10*3/uL (ref 0.1–1.0)
Monocytes Relative: 3 %
Neutro Abs: 13.6 10*3/uL — ABNORMAL HIGH (ref 1.7–7.7)
Neutrophils Relative %: 90 %
Platelets: 369 10*3/uL (ref 150–400)
RBC: 5.22 MIL/uL — ABNORMAL HIGH (ref 3.87–5.11)
RDW: 13.2 % (ref 11.5–15.5)
WBC: 15.1 10*3/uL — ABNORMAL HIGH (ref 4.0–10.5)
nRBC: 0 % (ref 0.0–0.2)

## 2022-07-05 LAB — COMPREHENSIVE METABOLIC PANEL
ALT: 22 U/L (ref 0–44)
AST: 18 U/L (ref 15–41)
Albumin: 3.7 g/dL (ref 3.5–5.0)
Alkaline Phosphatase: 63 U/L (ref 38–126)
Anion gap: 12 (ref 5–15)
BUN: 34 mg/dL — ABNORMAL HIGH (ref 8–23)
CO2: 24 mmol/L (ref 22–32)
Calcium: 9.9 mg/dL (ref 8.9–10.3)
Chloride: 98 mmol/L (ref 98–111)
Creatinine, Ser: 1.28 mg/dL — ABNORMAL HIGH (ref 0.44–1.00)
GFR, Estimated: 44 mL/min — ABNORMAL LOW (ref >60)
Glucose, Bld: 421 mg/dL — ABNORMAL HIGH (ref 70–99)
Potassium: 5 mmol/L (ref 3.5–5.1)
Sodium: 134 mmol/L — ABNORMAL LOW (ref 135–145)
Total Bilirubin: 0.6 mg/dL (ref 0.3–1.2)
Total Protein: 6.4 g/dL — ABNORMAL LOW (ref 6.5–8.1)

## 2022-07-05 MED ORDER — CYCLOBENZAPRINE HCL 10 MG PO TABS: 10.0000 mg | ORAL_TABLET | Freq: Two times a day (BID) | ORAL | 0 refills | Status: DC | PRN

## 2022-07-05 MED ORDER — OXYCODONE HCL 5 MG PO TABS: 5.0000 mg | ORAL_TABLET | Freq: Four times a day (QID) | ORAL | 0 refills | Status: DC | PRN

## 2022-07-05 MED ORDER — OXYCODONE HCL 5 MG PO TABS
5.0000 mg | ORAL_TABLET | Freq: Once | ORAL | Status: AC
  Administered 2022-07-05: 5 mg via ORAL
  Filled 2022-07-05: qty 1

## 2022-07-05 NOTE — ED Notes (Signed)
AVS reviewed with pt prior to discharge. Pt verbalizes understanding. Belongings with pt upon depart. Pt taken to POV via wheelchair with husband driving home.

## 2022-07-05 NOTE — ED Provider Notes (Signed)
[facility] Provider Note   CSN: [id number] Arrival date & time: 07/05/22  1226     History  Chief Complaint  Patient presents with   Leg Pain    Mindy Ellis is a 72 y.o. female.  Patient here with on right lower back pain.  Having sciatic symptoms on the right leg at times.  She has been on tinazidine and and Medrol Dosepak with a little bit improvement.  Patient has had prior lumbar fusion.  She follows with neurosurgery.  She denies any loss of bowel or bladder.  She denies any weakness.  She is having some tingling down the right leg at times but mostly only when she stands up.  She uses a walker at home.  She has not had any falls.  Denies any fevers or chills.  The history is provided by the patient.       Home Medications Prior to Admission medications   Medication Sig Start Date End Date Taking? Authorizing Provider  cyclobenzaprine (FLEXERIL) 10 MG tablet Take 1 tablet (10 mg total) by mouth 2 (two) times daily as needed for muscle spasms. 07/05/22  Yes Mclain Freer, DO  oxyCODONE (ROXICODONE) 5 MG immediate release tablet Take 1 tablet (5 mg total) by mouth every 6 (six) hours as needed for severe pain or breakthrough pain. 07/05/22  Yes Ali, Amjad, PA-C  amitriptyline (ELAVIL) 50 MG tablet Take 50 mg by mouth at bedtime.    [provider]  Calcium Carbonate-Vitamin D (CALCIUM + D PO) Take 600 mg by mouth daily.    [provider]  cholecalciferol (VITAMIN D3) 25 MCG (1000 UNIT) tablet Take 2,000 Units by mouth daily.    [provider]  diltiazem (CARDIZEM CD) 180 MG 24 hr capsule Take 180 mg by mouth daily. 03/17/20   [provider]  MAGNESIUM MALATE PO Take 1 tablet by mouth at bedtime.    [provider]  methocarbamol (ROBAXIN) 500 MG tablet Take by mouth. 07/13/20   [provider]  Nitrofurantoin Monohyd Macro (MACROBID PO) Take by mouth.    [provider]  nitroGLYCERIN (NITROSTAT) 0.4 MG SL tablet Place 1 tablet (0.4 mg total) under the tongue every 5 (five) minutes as needed for chest pain. 11/02/19   Rolland Porter, MD  ondansetron (ZOFRAN) 4 MG tablet Take 1 tablet (4 mg total) by mouth every 8 (eight) hours as needed. 01/30/21   Hyatt, Max T, DPM  traZODone (DESYREL) 100 MG tablet Take 100 mg by mouth at bedtime.    [provider]  fexofenadine (ALLEGRA) 180 MG tablet Take by mouth daily.    06/22/11  [provider]  mirtazapine (REMERON) 15 MG tablet Take 15 mg by mouth at bedtime.    06/22/11  [provider]  omeprazole (PRILOSEC) 20 MG capsule Take 20 mg by mouth daily.    06/22/11  [provider]  promethazine (PHENERGAN) 25 MG tablet Take 25 mg by mouth every 6 (six) hours as needed.    06/22/11  [provider]      Allergies    Latex, Mirtazapine, Naproxen sodium, Other, Rofecoxib, Statins, Zolpidem tartrate, Elemental sulfur, Erythromycin, Penicillins, and Tetracyclines & related    Review of Systems   Review of Systems  Physical Exam Updated Vital Signs BP (!) 140/70   Pulse 95   Temp 97.8 F (36.6 C)   Resp 18   SpO2 96%  Physical Exam Vitals  and nursing note reviewed.  Constitutional:      General: She is not in acute distress.    Appearance: She is well-developed.  HENT:     Head: Normocephalic and atraumatic.     Mouth/Throat:     Mouth: Mucous membranes are moist.  Eyes:     Extraocular Movements: Extraocular movements intact.     Conjunctiva/sclera: Conjunctivae normal.     Pupils: Pupils are equal, round, and reactive to light.  Cardiovascular:     Rate and Rhythm: Normal rate and regular rhythm.     Pulses: Normal pulses.     Heart sounds: No murmur heard. Pulmonary:     Effort: Pulmonary effort is normal. No respiratory distress.     Breath sounds: Normal breath sounds.  Abdominal:     Palpations: Abdomen is soft.     Tenderness: There is no abdominal  tenderness.  Musculoskeletal:        General: Tenderness present. No swelling.     Cervical back: Neck supple.     Comments: Tenderness to the paraspinal lumbar muscles on the right the right gluteal area  Skin:    General: Skin is warm and dry.     Capillary Refill: Capillary refill takes less than 2 seconds.  Neurological:     General: No focal deficit present.     Mental Status: She is alert.     Sensory: No sensory deficit.     Motor: No weakness.  Psychiatric:        Mood and Affect: Mood normal.     ED Results / Procedures / Treatments   Labs (all labs ordered are listed, but only abnormal results are displayed) Labs Reviewed  COMPREHENSIVE METABOLIC PANEL - Abnormal; Notable for the following components:      Result Value   Sodium 134 (*)    Glucose, Bld 421 (*)    BUN 34 (*)    Creatinine, Ser 1.28 (*)    Total Protein 6.4 (*)    GFR, Estimated 44 (*)    All other components within normal limits  CBC WITH DIFFERENTIAL/PLATELET - Abnormal; Notable for the following components:   WBC 15.1 (*)    RBC 5.22 (*)    Neutro Abs 13.6 (*)    Abs Immature Granulocytes 0.14 (*)    All other components within normal limits    EKG None  Radiology No results found.  Procedures Procedures    Medications Ordered in ED Medications  oxyCODONE (Oxy IR/ROXICODONE) immediate release tablet 5 mg (5 mg Oral Given 07/05/22 1321)    ED Course/ Medical Decision Making/ A&P                             Medical Decision Making Amount and/or Complexity of Data Reviewed Labs: ordered.  Risk Prescription drug management.   Mindy Ellis is here with right lower back pain.  She is on treatment for sciatica with Medrol Dosepak and tenacity in.  Not helping much.  She denies any symptoms of cauda equina.  She is very well-appearing.  Denies any loss of bowel or bladder.  No weakness.  She is having tingling down the leg at times.  She got blood work done prior to my evaluation.   She has elevated blood sugar but she is not in DKA.  I suspect that this is from her prednisone that she is finished now.  Ultimately will have her aggressively hydrate  at home.  Will put her on oxycodone for breakthrough pain.  She has had better success with Flexeril in the past and will prescribe for that.  She is also been using lidocaine patches and Voltaren.  Overall she appears well.  Neurologically she is intact.  Recommend follow-up with her neurosurgeon and primary care doctor.  Discharged in good condition.  This chart was dictated using voice recognition software.  Despite best efforts to proofread,  errors can occur which can change the documentation meaning.         Final Clinical Impression(s) / ED Diagnoses Final diagnoses:  Acute right-sided low back pain with sciatica, sciatica laterality unspecified    Rx / DC Orders ED Discharge Orders          Ordered    cyclobenzaprine (FLEXERIL) 10 MG tablet  2 times daily PRN,   Status:  Discontinued        07/05/22 1402    oxyCODONE (ROXICODONE) 5 MG immediate release tablet  Every 6 hours PRN,   Status:  Discontinued        07/05/22 1402    cyclobenzaprine (FLEXERIL) 10 MG tablet  2 times daily PRN        07/05/22 1403    oxyCODONE (ROXICODONE) 5 MG immediate release tablet  Every 6 hours PRN        07/05/22 [place], [place], DO 07/05/22 1411

## 2022-07-05 NOTE — ED Triage Notes (Signed)
Patient here for evaluation of right leg pain that starts in her buttocks and radiates down leg. Seen for same at urgent care and diagnosed with sciatica. Is currently prescribed PO prednisone and tizanidine but states pain is unrelieved.

## 2022-07-09 DIAGNOSIS — M545 Low back pain, unspecified: Secondary | ICD-10-CM | POA: Diagnosis not present

## 2022-07-09 DIAGNOSIS — M25551 Pain in right hip: Secondary | ICD-10-CM | POA: Diagnosis not present

## 2022-07-12 ENCOUNTER — Emergency Department (HOSPITAL_COMMUNITY)
Admission: EM | Admit: 2022-07-12 | Discharge: 2022-07-12 | Disposition: A | Discharge status: Home / Self Care | Payer: Medicare PPO | Attending: Emergency Medicine | Admitting: Emergency Medicine

## 2022-07-12 ENCOUNTER — Encounter (HOSPITAL_COMMUNITY): Payer: Self-pay

## 2022-07-12 ENCOUNTER — Emergency Department (HOSPITAL_COMMUNITY): Payer: Medicare PPO

## 2022-07-12 ENCOUNTER — Other Ambulatory Visit: Payer: Self-pay

## 2022-07-12 DIAGNOSIS — M5136 Other intervertebral disc degeneration, lumbar region: Secondary | ICD-10-CM | POA: Diagnosis not present

## 2022-07-12 DIAGNOSIS — M5431 Sciatica, right side: Secondary | ICD-10-CM

## 2022-07-12 DIAGNOSIS — Z91040 Latex allergy status: Secondary | ICD-10-CM | POA: Diagnosis not present

## 2022-07-12 DIAGNOSIS — M545 Low back pain, unspecified: Secondary | ICD-10-CM | POA: Diagnosis not present

## 2022-07-12 DIAGNOSIS — M5441 Lumbago with sciatica, right side: Secondary | ICD-10-CM | POA: Diagnosis not present

## 2022-07-12 LAB — BASIC METABOLIC PANEL
Anion gap: 13 (ref 5–15)
BUN: 22 mg/dL (ref 8–23)
CO2: 23 mmol/L (ref 22–32)
Calcium: 9.1 mg/dL (ref 8.9–10.3)
Chloride: 96 mmol/L — ABNORMAL LOW (ref 98–111)
Creatinine, Ser: 1.02 mg/dL — ABNORMAL HIGH (ref 0.44–1.00)
GFR, Estimated: 58 mL/min — ABNORMAL LOW (ref >60)
Glucose, Bld: 255 mg/dL — ABNORMAL HIGH (ref 70–99)
Potassium: 3.8 mmol/L (ref 3.5–5.1)
Sodium: 132 mmol/L — ABNORMAL LOW (ref 135–145)

## 2022-07-12 LAB — CBC WITH DIFFERENTIAL/PLATELET
Abs Immature Granulocytes: 0.61 10*3/uL — ABNORMAL HIGH (ref 0.00–0.07)
Basophils Absolute: 0.1 10*3/uL (ref 0.0–0.1)
Basophils Relative: 0 %
Eosinophils Absolute: 0 10*3/uL (ref 0.0–0.5)
Eosinophils Relative: 0 %
HCT: 41 % (ref 36.0–46.0)
Hemoglobin: 13.3 g/dL (ref 12.0–15.0)
Immature Granulocytes: 4 %
Lymphocytes Relative: 7 %
Lymphs Abs: 1.1 10*3/uL (ref 0.7–4.0)
MCH: 27.9 pg (ref 26.0–34.0)
MCHC: 32.4 g/dL (ref 30.0–36.0)
MCV: 86.1 fL (ref 80.0–100.0)
Monocytes Absolute: 0.7 10*3/uL (ref 0.1–1.0)
Monocytes Relative: 4 %
Neutro Abs: 13.5 10*3/uL — ABNORMAL HIGH (ref 1.7–7.7)
Neutrophils Relative %: 85 %
Platelets: 374 10*3/uL (ref 150–400)
RBC: 4.76 MIL/uL (ref 3.87–5.11)
RDW: 13.4 % (ref 11.5–15.5)
WBC: 16 10*3/uL — ABNORMAL HIGH (ref 4.0–10.5)
nRBC: 0 % (ref 0.0–0.2)

## 2022-07-12 MED ORDER — HYDROMORPHONE HCL 2 MG PO TABS: 1.0000 mg | ORAL_TABLET | Freq: Four times a day (QID) | ORAL | 0 refills | Status: DC | PRN

## 2022-07-12 MED ORDER — KETOROLAC TROMETHAMINE 15 MG/ML IJ SOLN
15.0000 mg | Freq: Once | INTRAMUSCULAR | Status: AC
  Administered 2022-07-12: 15 mg via INTRAVENOUS
  Filled 2022-07-12: qty 1

## 2022-07-12 MED ORDER — GADOBUTROL 1 MMOL/ML IV SOLN
8.0000 mL | Freq: Once | INTRAVENOUS | Status: AC | PRN
  Administered 2022-07-12: 8 mL via INTRAVENOUS

## 2022-07-12 MED ORDER — ACETAMINOPHEN 500 MG PO TABS
1000.0000 mg | ORAL_TABLET | Freq: Once | ORAL | Status: AC
  Administered 2022-07-12: 1000 mg via ORAL
  Filled 2022-07-12: qty 2

## 2022-07-12 MED ORDER — ONDANSETRON HCL 4 MG/2ML IJ SOLN
4.0000 mg | Freq: Once | INTRAMUSCULAR | Status: AC
  Administered 2022-07-12: 4 mg via INTRAVENOUS
  Filled 2022-07-12: qty 2

## 2022-07-12 MED ORDER — DIAZEPAM 5 MG PO TABS
5.0000 mg | ORAL_TABLET | Freq: Once | ORAL | Status: AC
  Administered 2022-07-12: 5 mg via ORAL
  Filled 2022-07-12: qty 1

## 2022-07-12 MED ORDER — HYDROMORPHONE HCL 1 MG/ML IJ SOLN
0.5000 mg | Freq: Once | INTRAMUSCULAR | Status: AC
  Administered 2022-07-12: 0.5 mg via INTRAVENOUS
  Filled 2022-07-12: qty 1

## 2022-07-12 MED ORDER — PREDNISONE 20 MG PO TABS: 40.0000 mg | ORAL_TABLET | Freq: Every day | ORAL | 0 refills | Status: DC

## 2022-07-12 MED ORDER — HYDROMORPHONE HCL 1 MG/ML IJ SOLN
1.0000 mg | Freq: Once | INTRAMUSCULAR | Status: AC
  Administered 2022-07-12: 1 mg via INTRAVENOUS
  Filled 2022-07-12: qty 1

## 2022-07-12 MED ORDER — DICLOFENAC EPOLAMINE 1.3 % EX PTCH
1.0000 | MEDICATED_PATCH | Freq: Two times a day (BID) | CUTANEOUS | Status: DC
  Filled 2022-07-12: qty 1

## 2022-07-12 MED ORDER — DICLOFENAC EPOLAMINE 1.3 % EX PTCH: 1.0000 | MEDICATED_PATCH | Freq: Two times a day (BID) | CUTANEOUS | Status: DC

## 2022-07-12 NOTE — Discharge Instructions (Signed)
As discussed, it is importantly follow-up with your neurosurgeon in 2 days.  Continue using your oxycodone and obtain and use the newly prescribed steroids as well as the medicated patches.  The commendation of these 3 medications should make your pain substantially better.  Return here for concerning changes in your condition.

## 2022-07-12 NOTE — ED Provider Notes (Signed)
Care of the patient assumed at signout.  On exam she is awake, alert, has been able to ambulate with assistance to the bathroom has been able to void.  I have reviewed her MRI and discussed it with her neurosurgeon.  Patient has a scheduled follow-up in 48 hours, and given her improved condition here this is appropriate, patient is amenable to the plan as is her husband.   Gerhard Munch, MD 07/12/22 [phone]

## 2022-07-12 NOTE — ED Provider Notes (Signed)
[facility] EMERGENCY DEPARTMENT AT [facility] Provider Note   CSN: [id number] Arrival date & time: 07/12/22  1207     History  Chief Complaint  Patient presents with   Sciatica    Mindy Ellis is a 72 y.o. female.  72 yo F with a cc of R hip pain.  Starts in the buttock and goes down to the foot.  No loss of bladder, but feels different when she urinates.  Some weakness to the lower extremity with tingling.  No loss perectalsensation.        Home Medications Prior to Admission medications   Medication Sig Start Date End Date Taking? Authorizing Provider  amitriptyline (ELAVIL) 50 MG tablet Take 50 mg by mouth at bedtime.    [provider]  Calcium Carbonate-Vitamin D (CALCIUM + D PO) Take 600 mg by mouth daily.    [provider]  cholecalciferol (VITAMIN D3) 25 MCG (1000 UNIT) tablet Take 2,000 Units by mouth daily.    [provider]  cyclobenzaprine (FLEXERIL) 10 MG tablet Take 1 tablet (10 mg total) by mouth 2 (two) times daily as needed for muscle spasms. 07/05/22   Curatolo, Adam, DO  diltiazem (CARDIZEM CD) 180 MG 24 hr capsule Take 180 mg by mouth daily. 03/17/20   [provider]  MAGNESIUM MALATE PO Take 1 tablet by mouth at bedtime.    [provider]  methocarbamol (ROBAXIN) 500 MG tablet Take by mouth. 07/13/20   [provider]  Nitrofurantoin Monohyd Macro (MACROBID PO) Take by mouth.    [provider]  nitroGLYCERIN (NITROSTAT) 0.4 MG SL tablet Place 1 tablet (0.4 mg total) under the tongue every 5 (five) minutes as needed for chest pain. 11/02/19   Devoria Albe, MD  ondansetron (ZOFRAN) 4 MG tablet Take 1 tablet (4 mg total) by mouth every 8 (eight) hours as needed. 01/30/21   Hyatt, Max T, DPM  oxyCODONE (ROXICODONE) 5 MG immediate release tablet Take 1 tablet (5 mg total) by mouth every 6 (six) hours as needed for severe pain or breakthrough pain. 07/05/22   Marita Kansas, PA-C  traZODone  (DESYREL) 100 MG tablet Take 100 mg by mouth at bedtime.    [provider]  fexofenadine (ALLEGRA) 180 MG tablet Take by mouth daily.    06/22/11  [provider]  mirtazapine (REMERON) 15 MG tablet Take 15 mg by mouth at bedtime.    06/22/11  [provider]  omeprazole (PRILOSEC) 20 MG capsule Take 20 mg by mouth daily.    06/22/11  [provider]  promethazine (PHENERGAN) 25 MG tablet Take 25 mg by mouth every 6 (six) hours as needed.    06/22/11  [provider]      Allergies    Latex, Mirtazapine, Naproxen sodium, Other, Rofecoxib, Statins, Zolpidem tartrate, Elemental sulfur, Erythromycin, Penicillins, and Tetracyclines & related    Review of Systems   Review of Systems  Physical Exam Updated Vital Signs BP 134/64   Pulse 99   Temp 97.7 F (36.5 C) (Oral)   Resp 18   SpO2 97%  Physical Exam Vitals and nursing note reviewed.  Constitutional:      General: She is not in acute distress.    Appearance: She is well-developed. She is not diaphoretic.  HENT:     Head: Normocephalic and atraumatic.  Eyes:     Pupils: Pupils are equal, round, and reactive to light.  Cardiovascular:     Rate  and Rhythm: Normal rate and regular rhythm.     Heart sounds: No murmur heard.    No friction rub. No gallop.  Pulmonary:     Effort: Pulmonary effort is normal.     Breath sounds: No wheezing or rales.  Abdominal:     General: There is no distension.     Palpations: Abdomen is soft.     Tenderness: There is no abdominal tenderness.  Musculoskeletal:        General: Tenderness present.     Cervical back: Normal range of motion and neck supple.     Comments: Pain from the right piriformis muscle belly.  She denies any subjective sensation to the plantar aspect of the foot which is new for her.  Intact pulse and motor.  Has sensation to light touch in the other nerve distributions though diminished she says from baseline.  Reflexes are 2+ and  equal.  No clonus.  Skin:    General: Skin is warm and dry.  Neurological:     Mental Status: She is alert and oriented to person, place, and time.  Psychiatric:        Behavior: Behavior normal.     ED Results / Procedures / Treatments   Labs (all labs ordered are listed, but only abnormal results are displayed) Labs Reviewed  CBC WITH DIFFERENTIAL/PLATELET - Abnormal; Notable for the following components:      Result Value   WBC 16.0 (*)    Neutro Abs 13.5 (*)    Abs Immature Granulocytes 0.61 (*)    All other components within normal limits  BASIC METABOLIC PANEL - Abnormal; Notable for the following components:   Sodium 132 (*)    Chloride 96 (*)    Glucose, Bld 255 (*)    Creatinine, Ser 1.02 (*)    GFR, Estimated 58 (*)    All other components within normal limits    EKG None  Radiology No results found.  Procedures Procedures    Medications Ordered in ED Medications  HYDROmorphone (DILAUDID) injection 0.5 mg (0.5 mg Intravenous Given 07/12/22 1347)  ondansetron (ZOFRAN) injection 4 mg (4 mg Intravenous Given 07/12/22 1347)  ketorolac (TORADOL) 15 MG/ML injection 15 mg (15 mg Intravenous Given 07/12/22 1347)  acetaminophen (TYLENOL) tablet 1,000 mg (1,000 mg Oral Given 07/12/22 1347)  diazepam (VALIUM) tablet 5 mg (5 mg Oral Given 07/12/22 1346)    ED Course/ Medical Decision Making/ A&P                             Medical Decision Making Amount and/or Complexity of Data Reviewed Labs: ordered. Radiology: ordered.  Risk OTC drugs. Prescription drug management.   72 yo F with a chief complaints of right-sided low back pain that radiates down the leg.  This is going on for few weeks now.  The patient had been seen at urgent care and in the ED previous to this.  She had seen a neurosurgeon shortly before hand but had different pain.  She had episode where she felt like her back caught.  Has been trying home medications without improvement.  She feels like things  have worsened to the point that she needs an MRI.  I had a long discussion with her and family member about how the MRI itself does not typically resolve back pain, with her change to her urinary sensation and with decreased sensation to the plantar aspect of the foot will  do an MRI to assess for cauda equina syndrome.  Will treat pain aggressively here.  Reassess.  Leukocytosis.  Mild hyponatremia, hypochloridemia.   Signed out to Dr. Jeraldine LootsLockwood, please see their note for further details of care in the ED.  The patients results and plan were reviewed and discussed.   Any x-rays performed were independently reviewed by myself.   Differential diagnosis were considered with the presenting HPI.  Medications  HYDROmorphone (DILAUDID) injection 0.5 mg (0.5 mg Intravenous Given 07/12/22 1347)  ondansetron (ZOFRAN) injection 4 mg (4 mg Intravenous Given 07/12/22 1347)  ketorolac (TORADOL) 15 MG/ML injection 15 mg (15 mg Intravenous Given 07/12/22 1347)  acetaminophen (TYLENOL) tablet 1,000 mg (1,000 mg Oral Given 07/12/22 1347)  diazepam (VALIUM) tablet 5 mg (5 mg Oral Given 07/12/22 1346)    Vitals:   07/12/22 1214 07/12/22 1530  BP: (!) 144/84 134/64  Pulse: 90 99  Resp: 18 18  Temp: 97.6 F (36.4 C) 97.7 F (36.5 C)  TempSrc:  Oral  SpO2: 100% 97%    Final diagnoses:  Right sided sciatica          Final Clinical Impression(s) / ED Diagnoses Final diagnoses:  Right sided sciatica    Rx / DC Orders ED Discharge Orders     None         Melene PlanFloyd, Emri Sample, DO 07/12/22 1534

## 2022-07-12 NOTE — ED Triage Notes (Signed)
Pt c/o pain running from R buttock down R leg, worsening over last day; no known injury; hx sciatica; taking norco at home w/o relief

## 2022-07-13 ENCOUNTER — Telehealth (HOSPITAL_COMMUNITY): Payer: Self-pay | Admitting: Emergency Medicine

## 2022-07-13 ENCOUNTER — Telehealth: Payer: Self-pay

## 2022-07-13 MED ORDER — OXYCODONE HCL 5 MG PO TABS: 5.0000 mg | ORAL_TABLET | Freq: Four times a day (QID) | ORAL | 0 refills | Status: DC | PRN

## 2022-07-13 NOTE — Telephone Encounter (Signed)
Received a call from pharmacy [facility] in [place] that they do not have Dilaudid.  Patient is asking to switch her pain medicine to the Oxy IR.

## 2022-07-13 NOTE — Telephone Encounter (Signed)
Pharmacy called  to inquire about dilaudid since she had recently filled percocet. Patient notes stated no relief from percocet and that is why dilaudid ordered. Per provider. Patient called right after spoke with pharmacy and stated pharmacy would not fill, explained I had just spoken to them and they will fill it.

## 2022-07-14 ENCOUNTER — Telehealth: Payer: Self-pay | Admitting: Podiatry

## 2022-07-14 ENCOUNTER — Ambulatory Visit: Payer: Medicare PPO | Admitting: Podiatry

## 2022-07-14 DIAGNOSIS — M5126 Other intervertebral disc displacement, lumbar region: Secondary | ICD-10-CM | POA: Diagnosis not present

## 2022-07-14 DIAGNOSIS — Z6829 Body mass index (BMI) 29.0-29.9, adult: Secondary | ICD-10-CM | POA: Diagnosis not present

## 2022-07-14 NOTE — Telephone Encounter (Signed)
Pt left message on 4.5 @ 251pm stating she has thrown her back out and will not be able to make the appt on 4.8 @ 245.  I left message for pt that I got her message and to call to r/s

## 2022-07-16 ENCOUNTER — Other Ambulatory Visit: Payer: Self-pay | Admitting: Neurosurgery

## 2022-07-16 DIAGNOSIS — M5126 Other intervertebral disc displacement, lumbar region: Secondary | ICD-10-CM

## 2022-07-21 ENCOUNTER — Ambulatory Visit (HOSPITAL_COMMUNITY): Payer: Medicare PPO

## 2022-07-22 ENCOUNTER — Other Ambulatory Visit: Payer: Self-pay

## 2022-07-22 ENCOUNTER — Ambulatory Visit (HOSPITAL_COMMUNITY): Payer: Medicare PPO | Attending: Neurosurgery | Admitting: Physical Therapy

## 2022-07-22 DIAGNOSIS — M25572 Pain in left ankle and joints of left foot: Secondary | ICD-10-CM | POA: Insufficient documentation

## 2022-07-22 DIAGNOSIS — M6281 Muscle weakness (generalized): Secondary | ICD-10-CM | POA: Diagnosis not present

## 2022-07-22 DIAGNOSIS — M5416 Radiculopathy, lumbar region: Secondary | ICD-10-CM | POA: Diagnosis not present

## 2022-07-22 NOTE — Therapy (Signed)
OUTPATIENT PHYSICAL THERAPY THORACOLUMBAR EVALUATION   Patient Name: Mindy Ellis MRN: [id number] DOB:06-Apr-1951, 72 y.o., female Today's Date: 07/22/2022  END OF SESSION:  PT End of Session - 07/22/22 1121     Visit Number 1    Number of Visits 12    Date for PT Re-Evaluation 09/02/22    Authorization Type humana    PT Start Time 1030    PT Stop Time 1115    PT Time Calculation (min) 45 min    Activity Tolerance Patient limited by pain    Behavior During Therapy WFL for tasks assessed/performed             Past Medical History:  Diagnosis Date   Abdominal pain    Acid reflux    Chronic diarrhea    DDD (degenerative disc disease)    Depression    Diabetes mellitus without complication    diet controlled   Dysphagia    Fibromyalgia    GERD (gastroesophageal reflux disease)    Hypertension    Meningioma    PONV (postoperative nausea and vomiting)    nausea, pt must not vomit due to reflux surgery   Past Surgical History:  Procedure Laterality Date   ABDOMINAL HYSTERECTOMY  age 53   ANTERIOR AND POSTERIOR REPAIR N/A 01/18/2013   Procedure: ANTERIOR (CYSTOCELE) ;  Surgeon: Martina Sinner, MD;  Location: [facility];  Service: Urology;  Laterality: N/A;   APPENDECTOMY  age 25   both ovaries removed   BREAST SURGERY Left 21 yrs ago   benign lump removed   CHOLECYSTECTOMY     COLONOSCOPY  11/11/07   NUR   CYSTOSCOPY N/A 01/18/2013   Procedure: CYSTOSCOPY;  Surgeon: Martina Sinner, MD;  Location: [facility];  Service: Urology;  Laterality: N/A;   EYE SURGERY     lasix eye surgery Bilateral    PROCTOSCOPY  01/18/2013   Procedure: PROCTOSCOPY;  Surgeon: Martina Sinner, MD;  Location: [facility];  Service: Urology;;   surgery  for acid reflux  1999   TONSILLECTOMY     TRANSFORAMINAL LUMBAR INTERBODY FUSION (TLIF) WITH PEDICLE SCREW FIXATION 1 LEVEL Left 04/04/2020   Procedure: Left Lumbar four-five Transforaminal lumbar interbody fusion;  Surgeon: Maeola Harman,  MD;  [facility];  Service: Neurosurgery;  Laterality: Left;   UPPER GASTROINTESTINAL ENDOSCOPY  2006   Patient Active Problem List   Diagnosis Date Noted   Primary osteoarthritis of right hip 07/16/2020   Acute right hip pain 06/27/2020   Elevated blood-pressure reading, without diagnosis of hypertension 04/25/2020   Spondylolisthesis of lumbar region 04/04/2020   Chronic bilateral low back pain with left-sided sciatica 03/05/2020   Herniated nucleus pulposus, lumbar 03/05/2020   Other chronic pain 03/05/2020   Benign meningioma 02/14/2019   Body mass index (BMI) 29.0-29.9, adult 02/14/2019   Dyspareunia in female 01/04/2019   Cerebral meningioma 08/10/2013   Unspecified hereditary and idiopathic peripheral neuropathy 07/07/2013   Memory changes 07/07/2013   Type II or unspecified type diabetes mellitus without mention of complication, not stated as uncontrolled 07/07/2013   Unspecified essential hypertension 07/07/2013    PCP: Assunta Found   REFERRING PROVIDER: Lisbeth Renshaw, MD  REFERRING DIAG: M51.26 (ICD-10-CM) - Other intervertebral disc displacement, lumbar region  Rationale for Evaluation and Treatment: Rehabilitation  THERAPY DIAG:  Lumbar radiculopathy  Muscle weakness  ONSET DATE: chronic with acute exacerbation  SUBJECTIVE STATEMENT: PT states that she was having pain on her RT side on 3/26 and went to the ER and her MD.  She was dx with HNP but then when she was in the hospital she fell and now her main pain is on the LT side. She states that her whole left leg has pain in it.  Since she fell she has not been able to stand without her walker.  She fell again on Sunday.  She has been on predisone.  She is getting her back injected on Thursday.  Pt states that at this time she  only goes to the bathroom, or change rooms she is not on her feet longer than a couple of minutes at this time.   PERTINENT HISTORY:  DM, OA, HNP, Lt radiculopathy, lumbar fusion  PAIN:  Are you having pain? Yes: NPRS scale: 2/10 this is because she is sitting when she stands it goes to an 11  Pain location: LT buttock  Pain description: sharp Aggravating factors: weight bearing  Relieving factors: medication   PRECAUTIONS: None  WEIGHT BEARING RESTRICTIONS: No  FALLS:  Has patient fallen in last 6 months? No  LIVING ENVIRONMENT: Lives with: lives with their family Lives in: House/apartment Stairs: No Has following equipment at home: Environmental consultant - 2 wheeled  OCCUPATION: retired   PLOF: Independent with basic ADLs  PATIENT GOALS: to be able to walk without her walker again, be able to move in bed better.   NEXT MD VISIT: Thursday for her injection.  OBJECTIVE:   DIAGNOSTIC FINDINGS:   IMPRESSION: 1. New focal disc protrusion in the right subarticular zone at L5-S1 resulting in mass effect upon the descending right S1 nerve root. Mild bilateral foraminal stenosis at this level. 2. Interval posterior and interbody fusion at L4-L5. Widely patent canal at this level. There is perineural fibrosis associated with the exiting left L4 nerve root. 3. No canal stenosis at any level.     Electronically Signed   By: Duanne Guess D.O.   On: 07/12/2022 17:47        PATIENT SURVEYS:  FOTO 30   POSTURE: rounded shoulders, decreased lumbar lordosis, and increased thoracic kyphosis  PALPATION: Tight paraspinal mm  LUMBAR ROM: Pt is unable to stand without the support of her walker at this time therefore NT  AROM eval  Flexion   Extension   Right lateral flexion   Left lateral flexion   Right rotation   Left rotation    (Blank rows = not tested)  LOWER EXTREMITY MMT:    MMT Right eval Left eval  Hip flexion 2 2  Hip extension 2 2  Hip abduction 3- 2  Hip  adduction    Hip internal rotation    Hip external rotation    Knee flexion 3 3  Knee extension 4- 3+  Ankle dorsiflexion 4- 3-  Ankle plantarflexion    Ankle inversion    Ankle eversion     (Blank rows = not tested) FUNCTIONAL TESTS:  30 seconds chair stand test: unable to stand up without UE assist. With both hands unable to stand safely without a walker:  5; 7 is poor for age and sex without UE assist 2 minute walk test: with rolling walker, unable to walk greater than 1:15 and needed to sit due to pain  Single leg stance: Rt:  0 , Lt:0  TODAY'S TREATMENT:  DATE:  07/22/22: Evaluation   Sit tall x 10  Ab set x 10  Prone:  Glut set x 10    PATIENT EDUCATION:  Education details: HEp  Person educated: Patient Education method: Explanation Education comprehension: verbalized understanding  HOME EXERCISE PROGRAM: Access Code: [email or web address] URL: [email or web address] Date: 07/22/2022 Prepared by: Virgina Organ  Exercises - Correct Seated Posture  - 3 x daily - 7 x weekly - 1 sets - 10 reps - 5" hold - Seated Transversus Abdominis Bracing with PLB  - 3 x daily - 7 x weekly - 1 sets - 10 reps - 5" hold - Prone Gluteal Sets  - 2 x daily - 7 x weekly - 1 sets - 10 reps - 5" hold - Prone on Elbows Stretch  - 2 x daily - 7 x weekly - 2 sets - 10 reps - 15" hold  ASSESSMENT:  CLINICAL IMPRESSION: Patient is a 72 y.o. female who was seen today for physical therapy evaluation and treatment for lumbar pain. Evaluation demonstrates radicular sx, decreased activity tolerance, decreased strength, decreased balance and increased pain.  Ms. Jeancharles will benefit from skilled PT to address these issues and maximize her functional ability.     OBJECTIVE IMPAIRMENTS: decreased activity tolerance, decreased balance, decreased ROM, decreased strength,  increased fascial restrictions, impaired flexibility, postural dysfunction, and pain.   ACTIVITY LIMITATIONS: carrying, lifting, bending, sitting, standing, squatting, stairs, and locomotion level  PARTICIPATION LIMITATIONS: meal prep, cleaning, laundry, driving, shopping, and community activity  PERSONAL FACTORS: Age, Fitness, Past/current experiences, and Time since onset of injury/illness/exacerbation are also affecting patient's functional outcome.   REHAB POTENTIAL: Good  CLINICAL DECISION MAKING: Stable/uncomplicated  EVALUATION COMPLEXITY: Moderate   GOALS: Goals reviewed with patient? No  SHORT TERM GOALS: Target date: 08/12/22  I in HEP in order to decrease her pain level to no greater than a 7/10 Baseline: Goal status: INITIAL  2.  Pt mm strength to increase 1/2 strength in order to be able to be able to come sit to stand with greater ease Baseline:  Goal status: INITIAL  3.  Pt to be able to walk for five minutes with her walker to be more functional Baseline:  Goal status: INITIAL  LONG TERM GOALS: Target date: 09/02/22  I in an advanced HEP in order to decrease her pain level to no greater than a 3/10 Baseline:  Goal status: INITIAL  2.   Pt mm strength to increase 1 strength in order to be able to rise from a low lying couch.  Baseline:  Goal status: INITIAL  3.  Pt to be able to ambulate with a cane for 15 minutes for improved mobility; for 30 minutes with a walker.  Baseline:  Goal status: INITIAL  4.  Pt to be able to stand for 10-15 minutes for meal prep. Baseline:  Goal status: INITIAL    PLAN:  PT FREQUENCY: 2x/week  PT DURATION: 6 weeks  PLANNED INTERVENTIONS: Therapeutic exercises, Therapeutic activity, Neuromuscular re-education, Balance training, Gait training, Patient/Family education, Self Care, Joint mobilization, and Manual therapy.  PLAN FOR NEXT SESSION: continue lumbar stability with extension based exercises.  Virgina Organ,  PT CLT [phone]  11:20 AM

## 2022-07-24 ENCOUNTER — Ambulatory Visit
Admission: RE | Admit: 2022-07-24 | Discharge: 2022-07-24 | Disposition: A | Discharge status: Home / Self Care | Payer: Medicare PPO | Source: Ambulatory Visit | Attending: Neurosurgery | Admitting: Neurosurgery

## 2022-07-24 ENCOUNTER — Other Ambulatory Visit: Payer: Self-pay | Admitting: Neurosurgery

## 2022-07-24 DIAGNOSIS — M5126 Other intervertebral disc displacement, lumbar region: Secondary | ICD-10-CM

## 2022-07-24 DIAGNOSIS — M5418 Radiculopathy, sacral and sacrococcygeal region: Secondary | ICD-10-CM | POA: Diagnosis not present

## 2022-07-24 MED ORDER — IOPAMIDOL (ISOVUE-M 200) INJECTION 41%
1.0000 mL | Freq: Once | INTRAMUSCULAR | Status: AC
  Administered 2022-07-24: 1 mL via EPIDURAL

## 2022-07-24 MED ORDER — METHYLPREDNISOLONE ACETATE 40 MG/ML INJ SUSP (RADIOLOG
80.0000 mg | Freq: Once | INTRAMUSCULAR | Status: AC
  Administered 2022-07-24: 80 mg via EPIDURAL

## 2022-07-24 NOTE — Discharge Instructions (Signed)

## 2022-07-29 ENCOUNTER — Ambulatory Visit (HOSPITAL_COMMUNITY): Payer: Medicare PPO | Admitting: Physical Therapy

## 2022-07-29 DIAGNOSIS — M5416 Radiculopathy, lumbar region: Secondary | ICD-10-CM

## 2022-07-29 DIAGNOSIS — M6281 Muscle weakness (generalized): Secondary | ICD-10-CM | POA: Diagnosis not present

## 2022-07-29 DIAGNOSIS — M25572 Pain in left ankle and joints of left foot: Secondary | ICD-10-CM | POA: Diagnosis not present

## 2022-07-29 NOTE — Therapy (Signed)
OUTPATIENT PHYSICAL THERAPY TREATMENT   Patient Name: Mindy Ellis MRN: [id number] DOB:12/02/1950, 72 y.o., female Today's Date: 07/29/2022  END OF SESSION:  PT End of Session - 07/29/22 1545     Visit Number 2    Number of Visits 12    Date for PT Re-Evaluation 09/02/22    Authorization Type humana; [employer]here approved 12 visits and 1 re-eval 4/16-5/30    Authorization - Visit Number 2    Authorization - Number of Visits 12    Progress Note Due on Visit 10    PT Start Time 1438    PT Stop Time 1505    PT Time Calculation (min) 27 min    Activity Tolerance Patient limited by pain    Behavior During Therapy WFL for tasks assessed/performed              Past Medical History:  Diagnosis Date   Abdominal pain    Acid reflux    Chronic diarrhea    DDD (degenerative disc disease)    Depression    Diabetes mellitus without complication    diet controlled   Dysphagia    Fibromyalgia    GERD (gastroesophageal reflux disease)    Hypertension    Meningioma    PONV (postoperative nausea and vomiting)    nausea, pt must not vomit due to reflux surgery   Past Surgical History:  Procedure Laterality Date   ABDOMINAL HYSTERECTOMY  age 40   ANTERIOR AND POSTERIOR REPAIR N/A 01/18/2013   Procedure: ANTERIOR (CYSTOCELE) ;  Surgeon: Martina Sinner, MD;  Location: [facility];  Service: Urology;  Laterality: N/A;   APPENDECTOMY  age 4   both ovaries removed   BREAST SURGERY Left 21 yrs ago   benign lump removed   CHOLECYSTECTOMY     COLONOSCOPY  11/11/07   NUR   CYSTOSCOPY N/A 01/18/2013   Procedure: CYSTOSCOPY;  Surgeon: Martina Sinner, MD;  Location: [facility];  Service: Urology;  Laterality: N/A;   EYE SURGERY     lasix eye surgery Bilateral    PROCTOSCOPY  01/18/2013   Procedure: PROCTOSCOPY;  Surgeon: Martina Sinner, MD;  Location: [facility];  Service: Urology;;   surgery  for acid reflux  1999   TONSILLECTOMY     TRANSFORAMINAL LUMBAR INTERBODY FUSION (TLIF) WITH  PEDICLE SCREW FIXATION 1 LEVEL Left 04/04/2020   Procedure: Left Lumbar four-five Transforaminal lumbar interbody fusion;  Surgeon: Maeola Harman, MD;  Location: [facility] OR;  Service: Neurosurgery;  Laterality: Left;   UPPER GASTROINTESTINAL ENDOSCOPY  2006   Patient Active Problem List   Diagnosis Date Noted   Primary osteoarthritis of right hip 07/16/2020   Acute right hip pain 06/27/2020   Elevated blood-pressure reading, without diagnosis of hypertension 04/25/2020   Spondylolisthesis of lumbar region 04/04/2020   Chronic bilateral low back pain with left-sided sciatica 03/05/2020   Herniated nucleus pulposus, lumbar 03/05/2020   Other chronic pain 03/05/2020   Benign meningioma 02/14/2019   Body mass index (BMI) 29.0-29.9, adult 02/14/2019   Dyspareunia in female 01/04/2019   Cerebral meningioma 08/10/2013   Unspecified hereditary and idiopathic peripheral neuropathy 07/07/2013   Memory changes 07/07/2013   Type II or unspecified type diabetes mellitus without mention of complication, not stated as uncontrolled 07/07/2013   Unspecified essential hypertension 07/07/2013    PCP: Assunta Found   REFERRING PROVIDER: Lisbeth Renshaw, MD  REFERRING DIAG: M51.26 (ICD-10-CM) - Other intervertebral disc displacement, lumbar region  Rationale for Evaluation and Treatment:  Rehabilitation  THERAPY DIAG:  Lumbar radiculopathy  Muscle weakness  ONSET DATE: chronic with acute exacerbation                                                                                                                                                                                          SUBJECTIVE STATEMENT: Pt states when she is seated or laying down she is fine but as soon as she stands she has so much pain.  Has to use her walker and can barely walk. Cannot even lift her legs without her walker.  States she cannot tell any improvement yet from the shot and has had a round of prednisone.  States she is  so frustrated with this pain and inability to do anything.   Evaluation: PT states that she was having pain on her RT side on 3/26 and went to the ER and her MD.  She was dx with HNP but then when she was in the hospital she fell and now her main pain is on the LT side. She states that her whole left leg has pain in it.  Since she fell she has not been able to stand without her walker.  She fell again on Sunday.  She has been on predisone.  She is getting her back injected on Thursday.  Pt states that at this time she only goes to the bathroom, or change rooms she is not on her feet longer than a couple of minutes at this time.   PERTINENT HISTORY:  DM, OA, HNP, Lt radiculopathy, lumbar fusion  PAIN:  Are you having pain? Yes: NPRS scale: 2/10 this is because she is sitting when she stands it goes to an 11  Pain location: LT buttock  Pain description: sharp Aggravating factors: weight bearing  Relieving factors: medication   PRECAUTIONS: None  WEIGHT BEARING RESTRICTIONS: No  FALLS:  Has patient fallen in last 6 months? No  LIVING ENVIRONMENT: Lives with: lives with their family Lives in: House/apartment Stairs: No Has following equipment at home: Environmental consultant - 2 wheeled  OCCUPATION: retired   PLOF: Independent with basic ADLs  PATIENT GOALS: to be able to walk without her walker again, be able to move in bed better.   NEXT MD VISIT: Thursday for her injection.  OBJECTIVE:   DIAGNOSTIC FINDINGS:   IMPRESSION: 1. New focal disc protrusion in the right subarticular zone at L5-S1 resulting in mass effect upon the descending right S1 nerve root. Mild bilateral foraminal stenosis at this level. 2. Interval posterior and interbody fusion at L4-L5. Widely patent canal at this level. There is perineural fibrosis associated with the  exiting left L4 nerve root. 3. No canal stenosis at any level.     Electronically Signed   By: Duanne Guess D.O.   On: 07/12/2022 17:47         PATIENT SURVEYS:  FOTO 30   POSTURE: rounded shoulders, decreased lumbar lordosis, and increased thoracic kyphosis  PALPATION: Tight paraspinal mm  LUMBAR ROM: Pt is unable to stand without the support of her walker at this time therefore NT  AROM eval  Flexion   Extension   Right lateral flexion   Left lateral flexion   Right rotation   Left rotation    (Blank rows = not tested)  LOWER EXTREMITY MMT:    MMT Right eval Left eval  Hip flexion 2 2  Hip extension 2 2  Hip abduction 3- 2  Hip adduction    Hip internal rotation    Hip external rotation    Knee flexion 3 3  Knee extension 4- 3+  Ankle dorsiflexion 4- 3-  Ankle plantarflexion    Ankle inversion    Ankle eversion     (Blank rows = not tested) FUNCTIONAL TESTS:  30 seconds chair stand test: unable to stand up without UE assist. With both hands unable to stand safely without a walker:  5; 7 is poor for age and sex without UE assist 2 minute walk test: with rolling walker, unable to walk greater than 1:15 and needed to sit due to pain  Single leg stance: Rt:  0 , Lt:0  TODAY'S TREATMENT:                                                                                                                              DATE:  07/29/22 Seated:  goal review, sitting tall  Lumbar extension 10X Supine:  decompression 1-5 5" holds  Abdominal bracing 10X5"  Glute sets 10X5" Prone:  unable at this time  07/22/22: Evaluation   Sit tall x 10  Ab set x 10  Prone:  Glut set x 10  PATIENT EDUCATION:  Education details: HEp  Person educated: Patient Education method: Explanation Education comprehension: verbalized understanding  HOME EXERCISE PROGRAM: Access Code: [email or web address] URL: [email or web address] Date: 07/22/2022 Prepared by: Virgina Organ  Exercises - Correct Seated Posture  - 3 x daily - 7 x weekly - 1 sets - 10 reps - 5" hold - Seated Transversus Abdominis Bracing with PLB  - 3 x daily  - 7 x weekly - 1 sets - 10 reps - 5" hold - Prone Gluteal Sets  - 2 x daily - 7 x weekly - 1 sets - 10 reps - 5" hold - Prone on Elbows Stretch  - 2 x daily - 7 x weekly - 2 sets - 10 reps - 15" hold  ASSESSMENT:  CLINICAL IMPRESSION: Reviewed goals and POC moving forward.  Began decompression exercises and began gentle isometric core exercises.  Pt able to complete these without pain, however  required cues for breathing as tends to hold her breath.  Requires cues for general form and holds.  Pt reports difficulty feeling her LE's well when holding contractions. Pt unable to assume prone position to attempt extension but able to complete in seated today.  Treatment limited by pain this session.  Instructed to continue with given HEP with glute sets in supine and extension in seated.  Pt urged to follow up with MD regarding worsening symptoms. Pt will continue to benefit from skilled PT to address her deficits and educate in general body mechanics to improve her functional ability.     OBJECTIVE IMPAIRMENTS: decreased activity tolerance, decreased balance, decreased ROM, decreased strength, increased fascial restrictions, impaired flexibility, postural dysfunction, and pain.   ACTIVITY LIMITATIONS: carrying, lifting, bending, sitting, standing, squatting, stairs, and locomotion level  PARTICIPATION LIMITATIONS: meal prep, cleaning, laundry, driving, shopping, and community activity  PERSONAL FACTORS: Age, Fitness, Past/current experiences, and Time since onset of injury/illness/exacerbation are also affecting patient's functional outcome.   REHAB POTENTIAL: Good  CLINICAL DECISION MAKING: Stable/uncomplicated  EVALUATION COMPLEXITY: Moderate   GOALS: Goals reviewed with patient? Yes  SHORT TERM GOALS: Target date: 08/12/22  I in HEP in order to decrease her pain level to no greater than a 7/10 Baseline: Goal status: IN PROGRESS  2.  Pt mm strength to increase 1/2 strength in order to  be able to be able to come sit to stand with greater ease Baseline:  Goal status: IN PROGRESS  3.  Pt to be able to walk for five minutes with her walker to be more functional Baseline:  Goal status: IN PROGRESS  LONG TERM GOALS: Target date: 09/02/22  I in an advanced HEP in order to decrease her pain level to no greater than a 3/10 Baseline:  Goal status: IN PROGRESS  2.   Pt mm strength to increase 1 strength in order to be able to rise from a low lying couch.  Baseline:  Goal status: IN PROGRESS  3.  Pt to be able to ambulate with a cane for 15 minutes for improved mobility; for 30 minutes with a walker.  Baseline:  Goal status: IN PROGRESS  4.  Pt to be able to stand for 10-15 minutes for meal prep. Baseline:  Goal status: IN PROGRESS    PLAN:  PT FREQUENCY: 2x/week  PT DURATION: 6 weeks  PLANNED INTERVENTIONS: Therapeutic exercises, Therapeutic activity, Neuromuscular re-education, Balance training, Gait training, Patient/Family education, Self Care, Joint mobilization, and Manual therapy.  PLAN FOR NEXT SESSION: continue lumbar stability with extension based exercises.   Lurena Nida, PTA/CLT [facility] Ph: [phone]   Lurena Nida, PTA 07/29/2022, 3:47 PM

## 2022-07-31 ENCOUNTER — Encounter (HOSPITAL_COMMUNITY): Payer: Medicare PPO | Admitting: Physical Therapy

## 2022-08-06 ENCOUNTER — Encounter (HOSPITAL_COMMUNITY): Payer: Self-pay | Admitting: Physical Therapy

## 2022-08-06 DIAGNOSIS — M25552 Pain in left hip: Secondary | ICD-10-CM | POA: Diagnosis not present

## 2022-08-06 NOTE — Therapy (Signed)
PHYSICAL THERAPY DISCHARGE SUMMARY  Visits from Start of Care: 2  Current functional level related to goals / functional outcomes: No improvement pt states pain is from her hip and will be having a THR   Remaining deficits: High pain with WB   Education / Equipment: HEP   Patient agrees to discharge. Patient goals were not met. Patient is being discharged due to the patient's request.  Virgina Organ, PT CLT [phone]

## 2022-08-07 ENCOUNTER — Telehealth: Payer: Self-pay | Admitting: Orthopedic Surgery

## 2022-08-07 NOTE — Telephone Encounter (Signed)
Informed pt, she will get the disk

## 2022-08-07 NOTE — Telephone Encounter (Signed)
Dr. Mort Sawyers pt - the patient is scheduled to see Dr. Romeo Apple on 5/10 and she lvm stating that [facility] wants to know if we can access her xrays from [facility].  If so this will save her a trip to go pick them up.  [phone]

## 2022-08-08 ENCOUNTER — Encounter (HOSPITAL_COMMUNITY): Payer: Medicare PPO

## 2022-08-11 ENCOUNTER — Encounter (HOSPITAL_COMMUNITY): Payer: Medicare PPO | Admitting: Physical Therapy

## 2022-08-13 ENCOUNTER — Encounter (HOSPITAL_COMMUNITY): Payer: Medicare PPO | Admitting: Physical Therapy

## 2022-08-14 ENCOUNTER — Telehealth: Payer: Self-pay | Admitting: Orthopedic Surgery

## 2022-08-14 ENCOUNTER — Ambulatory Visit (INDEPENDENT_AMBULATORY_CARE_PROVIDER_SITE_OTHER): Payer: Medicare PPO | Admitting: Orthopedic Surgery

## 2022-08-14 ENCOUNTER — Encounter: Payer: Self-pay | Admitting: Orthopedic Surgery

## 2022-08-14 VITALS — BP 170/106 | HR 121 | Ht 64.0 in | Wt 167.0 lb

## 2022-08-14 DIAGNOSIS — M5417 Radiculopathy, lumbosacral region: Secondary | ICD-10-CM | POA: Diagnosis not present

## 2022-08-14 MED ORDER — OXYCODONE HCL 5 MG PO TABS
5.0000 mg | ORAL_TABLET | Freq: Four times a day (QID) | ORAL | 0 refills | Status: AC | PRN
Start: 2022-08-14 — End: 2022-08-19

## 2022-08-14 MED ORDER — PREDNISONE 10 MG PO TABS
10.0000 mg | ORAL_TABLET | Freq: Three times a day (TID) | ORAL | 0 refills | Status: DC
Start: 2022-08-14 — End: 2022-12-30

## 2022-08-14 MED ORDER — GABAPENTIN 100 MG PO CAPS
100.0000 mg | ORAL_CAPSULE | Freq: Three times a day (TID) | ORAL | 2 refills | Status: DC
Start: 2022-08-14 — End: 2022-12-30

## 2022-08-14 MED ORDER — TIZANIDINE HCL 4 MG PO TABS
4.0000 mg | ORAL_TABLET | Freq: Four times a day (QID) | ORAL | 0 refills | Status: DC | PRN
Start: 2022-08-14 — End: 2022-12-30

## 2022-08-14 NOTE — Telephone Encounter (Signed)
The nurse called [place] back left message, we called them left another message

## 2022-08-14 NOTE — Progress Notes (Addendum)
Chief Complaint  Patient presents with   Leg Pain    L hip down leg for 1 mo. Pt states she's seen Neuro and they don't think it's her back. Received epidural injection for the left side approx 3 wks ago, and went to ER for it. She states had a fall in the bathroom of ER and that's when she started having left sided pain.    72 year old female status post lumbar disc fusion L4-5 2021 doing well until March of this year she bent over to get something and started having right leg pain she went to the emergency room twice the second time she fell landed on her left buttock and then started having left hip pain  After 2 visits to the ER she saw neurosurgery they injected her on the left side based on MRI pathology patient comes in complaining of left-sided hip and leg pain  Her exam  She can only ambulate with a walker  She has tenderness on the left side of her back and left buttock when I press on her left lower back she gets tingling down her leg posteriorly down to the foot  She has a sensation L5 (anterior compartment dorsum of foot)  Hip flexion 5 out of 5 right and left  Knee extension 5 out of 5 on the right 4 out of 5 on the left  Dorsiflexion 5 out of 5 on the right 3 out of 5 on the left  Plantarflexion 5 out of 5 on the right 5 out of 5 on the left  X-ray left hip done at the neurosurgery office I took pictures and put it in the chart the hip is normal no arthritis  I put a call into the neurosurgeons office no response as of yet we had to leave a message  72 year old female status post fusion obvious pathology favoring the lumbar spine and L5 nerve impingement   Recommend repeat trial of steroids and gabapentin and see neurosurgery again  Meds ordered this encounter  Medications   oxyCODONE (OXY IR/ROXICODONE) 5 MG immediate release tablet    Sig: Take 1 tablet (5 mg total) by mouth every 6 (six) hours as needed for up to 5 days for severe pain.    Dispense:  20 tablet     Refill:  0   gabapentin (NEURONTIN) 100 MG capsule    Sig: Take 1 capsule (100 mg total) by mouth 3 (three) times daily.    Dispense:  90 capsule    Refill:  2   predniSONE (DELTASONE) 10 MG tablet    Sig: Take 1 tablet (10 mg total) by mouth 3 (three) times daily.    Dispense:  42 tablet    Refill:  0   tiZANidine (ZANAFLEX) 4 MG tablet    Sig: Take 1 tablet (4 mg total) by mouth every 6 (six) hours as needed for muscle spasms.    Dispense:  30 tablet    Refill:  0

## 2022-08-14 NOTE — Telephone Encounter (Signed)
Left message on Dr Patric Dykes nurse line that Dr Romeo Apple needs to speak to Dr Patric Dykes about this patient hopefully he or she will call back sent message to front desk so they will be aware also

## 2022-08-15 ENCOUNTER — Ambulatory Visit: Payer: Medicare PPO | Admitting: Orthopedic Surgery

## 2022-08-15 ENCOUNTER — Telehealth: Payer: Self-pay | Admitting: Orthopedic Surgery

## 2022-08-15 NOTE — Telephone Encounter (Signed)
Dr. Mort Sawyers pt - patient lvm stating she was seen yesterday and that Dr. Romeo Apple was going to speak w/her back doctor.  She wants to know if he did and what she needs to be doing now.  [phone]

## 2022-08-15 NOTE — Telephone Encounter (Signed)
As he told her he wants her to follow up with back doctor he spoke to assistant stephanie and advised patient having weakness of leg and back pain I called her to advise   To you FYI

## 2022-08-18 ENCOUNTER — Ambulatory Visit (HOSPITAL_COMMUNITY): Payer: Medicare PPO

## 2022-08-18 ENCOUNTER — Encounter (HOSPITAL_COMMUNITY): Payer: Medicare PPO | Admitting: Physical Therapy

## 2022-08-20 ENCOUNTER — Encounter (HOSPITAL_COMMUNITY): Payer: Medicare PPO | Admitting: Physical Therapy

## 2022-08-21 ENCOUNTER — Ambulatory Visit (HOSPITAL_COMMUNITY)
Admission: RE | Admit: 2022-08-21 | Discharge: 2022-08-21 | Disposition: A | Discharge status: Home / Self Care | Payer: Self-pay | Source: Ambulatory Visit | Attending: Family Medicine | Admitting: Family Medicine

## 2022-08-21 DIAGNOSIS — E785 Hyperlipidemia, unspecified: Secondary | ICD-10-CM | POA: Insufficient documentation

## 2022-08-21 DIAGNOSIS — E1140 Type 2 diabetes mellitus with diabetic neuropathy, unspecified: Secondary | ICD-10-CM | POA: Insufficient documentation

## 2022-08-22 ENCOUNTER — Other Ambulatory Visit: Payer: Self-pay | Admitting: Neurosurgery

## 2022-08-22 DIAGNOSIS — M4316 Spondylolisthesis, lumbar region: Secondary | ICD-10-CM

## 2022-08-25 ENCOUNTER — Ambulatory Visit
Admission: RE | Admit: 2022-08-25 | Discharge: 2022-08-25 | Disposition: A | Discharge status: Home / Self Care | Payer: Medicare PPO | Source: Ambulatory Visit | Attending: Neurosurgery | Admitting: Neurosurgery

## 2022-08-25 ENCOUNTER — Encounter (HOSPITAL_COMMUNITY): Payer: Medicare PPO | Admitting: Physical Therapy

## 2022-08-25 DIAGNOSIS — M4316 Spondylolisthesis, lumbar region: Secondary | ICD-10-CM

## 2022-08-25 DIAGNOSIS — R531 Weakness: Secondary | ICD-10-CM | POA: Diagnosis not present

## 2022-08-25 DIAGNOSIS — I700 Atherosclerosis of aorta: Secondary | ICD-10-CM | POA: Diagnosis not present

## 2022-08-25 MED ORDER — MEPERIDINE HCL 50 MG/ML IJ SOLN: 50.0000 mg | Freq: Once | INTRAMUSCULAR | Status: DC | PRN

## 2022-08-25 MED ORDER — IOPAMIDOL (ISOVUE-M 200) INJECTION 41%
20.0000 mL | Freq: Once | INTRAMUSCULAR | Status: AC
  Administered 2022-08-25: 20 mL via INTRATHECAL

## 2022-08-25 MED ORDER — ONDANSETRON HCL 4 MG/2ML IJ SOLN: 4.0000 mg | Freq: Once | INTRAMUSCULAR | Status: DC | PRN

## 2022-08-25 MED ORDER — DIAZEPAM 5 MG PO TABS
5.0000 mg | ORAL_TABLET | Freq: Once | ORAL | Status: AC
  Administered 2022-08-25: 5 mg via ORAL

## 2022-08-25 NOTE — Discharge Instructions (Signed)

## 2022-08-27 ENCOUNTER — Encounter (HOSPITAL_COMMUNITY): Payer: Medicare PPO | Admitting: Physical Therapy

## 2022-08-28 DIAGNOSIS — M4316 Spondylolisthesis, lumbar region: Secondary | ICD-10-CM | POA: Diagnosis not present

## 2022-08-28 DIAGNOSIS — M5126 Other intervertebral disc displacement, lumbar region: Secondary | ICD-10-CM | POA: Diagnosis not present

## 2022-08-28 DIAGNOSIS — M25552 Pain in left hip: Secondary | ICD-10-CM | POA: Diagnosis not present

## 2022-09-02 ENCOUNTER — Encounter (HOSPITAL_COMMUNITY): Payer: Medicare PPO | Admitting: Physical Therapy

## 2022-09-04 ENCOUNTER — Other Ambulatory Visit (HOSPITAL_COMMUNITY): Payer: Self-pay | Admitting: Neurosurgery

## 2022-09-04 ENCOUNTER — Encounter (HOSPITAL_COMMUNITY): Payer: Medicare PPO | Admitting: Physical Therapy

## 2022-09-04 DIAGNOSIS — M25552 Pain in left hip: Secondary | ICD-10-CM

## 2022-09-08 ENCOUNTER — Encounter (HOSPITAL_COMMUNITY): Payer: Medicare PPO | Admitting: Physical Therapy

## 2022-09-12 ENCOUNTER — Ambulatory Visit (HOSPITAL_COMMUNITY)
Admission: RE | Admit: 2022-09-12 | Discharge: 2022-09-12 | Disposition: A | Discharge status: Home / Self Care | Payer: Medicare PPO | Source: Ambulatory Visit | Attending: Neurosurgery | Admitting: Neurosurgery

## 2022-09-12 DIAGNOSIS — M25552 Pain in left hip: Secondary | ICD-10-CM | POA: Diagnosis not present

## 2022-09-12 DIAGNOSIS — S73192A Other sprain of left hip, initial encounter: Secondary | ICD-10-CM | POA: Diagnosis not present

## 2022-09-18 ENCOUNTER — Telehealth: Payer: Self-pay | Admitting: Orthopedic Surgery

## 2022-09-18 NOTE — Telephone Encounter (Signed)
Patient has been advised

## 2022-09-18 NOTE — Telephone Encounter (Signed)
Spoke w/the patient, she stated that she had a MRI on her left hip, in Epic.  She is wanting to have surgery ASAP.  Can this be scheduled or do I need to setup an appointment for her to discuss surgery?

## 2022-09-18 NOTE — Telephone Encounter (Signed)
I will call her, ok to advise exactly what you said?

## 2022-09-22 DIAGNOSIS — M25552 Pain in left hip: Secondary | ICD-10-CM | POA: Diagnosis not present

## 2022-09-22 DIAGNOSIS — M5451 Vertebrogenic low back pain: Secondary | ICD-10-CM | POA: Diagnosis not present

## 2022-09-27 DIAGNOSIS — M5432 Sciatica, left side: Secondary | ICD-10-CM | POA: Diagnosis not present

## 2022-09-30 DIAGNOSIS — M21372 Foot drop, left foot: Secondary | ICD-10-CM | POA: Diagnosis not present

## 2022-09-30 DIAGNOSIS — M5451 Vertebrogenic low back pain: Secondary | ICD-10-CM | POA: Diagnosis not present

## 2022-10-23 DIAGNOSIS — R269 Unspecified abnormalities of gait and mobility: Secondary | ICD-10-CM | POA: Diagnosis not present

## 2022-10-23 DIAGNOSIS — M4800 Spinal stenosis, site unspecified: Secondary | ICD-10-CM | POA: Diagnosis not present

## 2022-10-23 DIAGNOSIS — I129 Hypertensive chronic kidney disease with stage 1 through stage 4 chronic kidney disease, or unspecified chronic kidney disease: Secondary | ICD-10-CM | POA: Diagnosis not present

## 2022-10-23 DIAGNOSIS — M1990 Unspecified osteoarthritis, unspecified site: Secondary | ICD-10-CM | POA: Diagnosis not present

## 2022-10-23 DIAGNOSIS — E785 Hyperlipidemia, unspecified: Secondary | ICD-10-CM | POA: Diagnosis not present

## 2022-10-23 DIAGNOSIS — R32 Unspecified urinary incontinence: Secondary | ICD-10-CM | POA: Diagnosis not present

## 2022-10-23 DIAGNOSIS — F419 Anxiety disorder, unspecified: Secondary | ICD-10-CM | POA: Diagnosis not present

## 2022-10-23 DIAGNOSIS — K219 Gastro-esophageal reflux disease without esophagitis: Secondary | ICD-10-CM | POA: Diagnosis not present

## 2022-10-23 DIAGNOSIS — G4700 Insomnia, unspecified: Secondary | ICD-10-CM | POA: Diagnosis not present

## 2022-10-27 DIAGNOSIS — M21372 Foot drop, left foot: Secondary | ICD-10-CM | POA: Diagnosis not present

## 2022-10-28 DIAGNOSIS — K589 Irritable bowel syndrome without diarrhea: Secondary | ICD-10-CM | POA: Diagnosis not present

## 2022-10-28 DIAGNOSIS — F419 Anxiety disorder, unspecified: Secondary | ICD-10-CM | POA: Diagnosis not present

## 2022-10-28 DIAGNOSIS — Z6829 Body mass index (BMI) 29.0-29.9, adult: Secondary | ICD-10-CM | POA: Diagnosis not present

## 2022-10-28 DIAGNOSIS — F329 Major depressive disorder, single episode, unspecified: Secondary | ICD-10-CM | POA: Diagnosis not present

## 2022-10-28 DIAGNOSIS — E1159 Type 2 diabetes mellitus with other circulatory complications: Secondary | ICD-10-CM | POA: Diagnosis not present

## 2022-10-28 DIAGNOSIS — E785 Hyperlipidemia, unspecified: Secondary | ICD-10-CM | POA: Diagnosis not present

## 2022-10-28 DIAGNOSIS — I10 Essential (primary) hypertension: Secondary | ICD-10-CM | POA: Diagnosis not present

## 2022-10-28 DIAGNOSIS — M5136 Other intervertebral disc degeneration, lumbar region: Secondary | ICD-10-CM | POA: Diagnosis not present

## 2022-10-28 DIAGNOSIS — E559 Vitamin D deficiency, unspecified: Secondary | ICD-10-CM | POA: Diagnosis not present

## 2022-10-28 DIAGNOSIS — G4730 Sleep apnea, unspecified: Secondary | ICD-10-CM | POA: Diagnosis not present

## 2022-11-10 DIAGNOSIS — E119 Type 2 diabetes mellitus without complications: Secondary | ICD-10-CM | POA: Diagnosis not present

## 2022-11-11 DIAGNOSIS — M21372 Foot drop, left foot: Secondary | ICD-10-CM | POA: Diagnosis not present

## 2022-11-12 ENCOUNTER — Ambulatory Visit (HOSPITAL_COMMUNITY)
Admission: RE | Admit: 2022-11-12 | Discharge: 2022-11-12 | Disposition: A | Discharge status: Home / Self Care | Payer: Medicare PPO | Source: Ambulatory Visit | Attending: Family Medicine | Admitting: Family Medicine

## 2022-11-12 DIAGNOSIS — Z1231 Encounter for screening mammogram for malignant neoplasm of breast: Secondary | ICD-10-CM | POA: Insufficient documentation

## 2022-12-01 DIAGNOSIS — Z1283 Encounter for screening for malignant neoplasm of skin: Secondary | ICD-10-CM | POA: Diagnosis not present

## 2022-12-01 DIAGNOSIS — L821 Other seborrheic keratosis: Secondary | ICD-10-CM | POA: Diagnosis not present

## 2022-12-01 DIAGNOSIS — D485 Neoplasm of uncertain behavior of skin: Secondary | ICD-10-CM | POA: Diagnosis not present

## 2022-12-01 DIAGNOSIS — L570 Actinic keratosis: Secondary | ICD-10-CM | POA: Diagnosis not present

## 2022-12-17 ENCOUNTER — Ambulatory Visit (INDEPENDENT_AMBULATORY_CARE_PROVIDER_SITE_OTHER): Payer: Medicare PPO | Admitting: Endocrinology

## 2022-12-17 ENCOUNTER — Encounter: Payer: Self-pay | Admitting: Endocrinology

## 2022-12-17 VITALS — BP 120/80 | HR 94 | Ht 64.0 in | Wt 177.8 lb

## 2022-12-17 DIAGNOSIS — E119 Type 2 diabetes mellitus without complications: Secondary | ICD-10-CM

## 2022-12-17 DIAGNOSIS — R7989 Other specified abnormal findings of blood chemistry: Secondary | ICD-10-CM

## 2022-12-17 DIAGNOSIS — Z7984 Long term (current) use of oral hypoglycemic drugs: Secondary | ICD-10-CM

## 2022-12-17 DIAGNOSIS — Z7985 Long-term (current) use of injectable non-insulin antidiabetic drugs: Secondary | ICD-10-CM | POA: Diagnosis not present

## 2022-12-17 LAB — T3, FREE: T3, Free: 3.5 pg/mL (ref 2.3–4.2)

## 2022-12-17 LAB — POCT GLYCOSYLATED HEMOGLOBIN (HGB A1C): Hemoglobin A1C: 6.5 % — AB (ref 4.0–5.6)

## 2022-12-17 LAB — T4, FREE: Free T4: 0.79 ng/dL (ref 0.60–1.60)

## 2022-12-17 LAB — TSH: TSH: 0.78 u[IU]/mL (ref 0.35–5.50)

## 2022-12-17 MED ORDER — TIRZEPATIDE 5 MG/0.5ML ~~LOC~~ SOAJ: 5.0000 mg | SUBCUTANEOUS | 4 refills | Status: DC

## 2022-12-17 NOTE — Progress Notes (Signed)
Outpatient Endocrinology Note [place] Lianette Broussard, MD  12/17/22  Patient's Name: Mindy Ellis    DOB: 1950/05/07    MRN: [id number]                                                    REASON OF VISIT: New consult for evaluation of type 2 diabetes mellitus  REFERRING PROVIDER:  Assunta Found, MD  PCP: Assunta Found, MD  HISTORY OF PRESENT ILLNESS:   Mindy Ellis is a 72 y.o. old female with past medical history listed below, is here for evaluation type 2 diabetes mellitus and low TSH.   Pertinent Diabetes History: Patient was diagnosed with type 2 diabetes mellitus around 2010.  She used to be on metformin was later stopped.  Patient is here for further evaluation and management of uncontrolled type 2 diabetes mellitus.  She takes oral and injectable steroid frequently for back pain/sciatic pain, worsening her diabetes control.  Chronic Diabetes Complications : Retinopathy: no. Last ophthalmology exam was done on annually, reportedly. Nephropathy: CKD Peripheral neuropathy: no Coronary artery disease: no Stroke: no  Relevant comorbidities and cardiovascular risk factors: Obesity: yes Body mass index is 30.52 kg/m.  Hypertension: yes Hyperlipidemia. Yes, on a statin.  Current / Home Diabetic regimen includes: Mounjaro 2.5 mg weekly. Glimepiride 2 mg daily.  Greggory Keen was started in July, 2024.  Prior diabetic medications: Metformin Januvia  Glycemic data:   She has been checking blood sugar in the morning fasting daily.  She did not bring glucometer to download however blood sugar reviewed from our glucose log as follows. -Glucose log: 96, 89, 76, 90, 89, 89, 88, 85, 98, 90, 94, 100, 73, 100, 73, 110, 84, 110.    Hypoglycemia: Patient has no hypoglycemic episodes. Patient has hypoglycemia awareness.  Factors modifying glucose control: 1.  Diabetic diet assessment: 3 meals a day.  2.  Staying active or exercising: Limited physical activity, no formal exercise due to  sciatic pain and back pain.  3.  Medication compliance: compliant all of the time.  # Low TSH Outside lab records reviewed, TSH 0. 415 in 05/2022 (reference range0.450 - 4.500), mildly low TSH at that time, she had normal TSH in the past 0.462.  Patient denies complaints of palpitation, heat intolerance.  No change in bowel habit.  No weight loss.  No neck discomfort or dysphagia.  No redness or watering of the eyes.  Family history of thyroid disorder in mother.  Interval history 12/17/22 Patient's glucose log reviewed as above.  She has been taking Mounjaro 2.5 mg weekly and tolerating well denies GI upset, nausea or vomiting.  No hypoglycemia.  She had mildly low TSH in February, not on thyroid medication.  Today hemoglobin A1c improved to 6.5%.  REVIEW OF SYSTEMS As per history of present illness.   PAST MEDICAL HISTORY: Past Medical History:  Diagnosis Date   Abdominal pain    Acid reflux    Chronic diarrhea    DDD (degenerative disc disease)    Depression    Diabetes mellitus without complication (HCC)    diet controlled   Dysphagia    Fibromyalgia    GERD (gastroesophageal reflux disease)    Hypertension    Meningioma (HCC)    PONV (postoperative nausea and vomiting)    nausea, pt must not vomit due to reflux  surgery    PAST SURGICAL HISTORY: Past Surgical History:  Procedure Laterality Date   ABDOMINAL HYSTERECTOMY  age 73   ANTERIOR AND POSTERIOR REPAIR N/A 01/18/2013   Procedure: ANTERIOR (CYSTOCELE) ;  Surgeon: Martina Sinner, MD;  Location: [facility];  Service: Urology;  Laterality: N/A;   APPENDECTOMY  age 40   both ovaries removed   BREAST SURGERY Left 21 yrs ago   benign lump removed   CHOLECYSTECTOMY     COLONOSCOPY  11/11/07   NUR   CYSTOSCOPY N/A 01/18/2013   Procedure: CYSTOSCOPY;  Surgeon: Martina Sinner, MD;  Location: [facility];  Service: Urology;  Laterality: N/A;   EYE SURGERY     lasix eye surgery Bilateral    PROCTOSCOPY  01/18/2013    Procedure: PROCTOSCOPY;  Surgeon: Martina Sinner, MD;  Location: [facility];  Service: Urology;;   surgery  for acid reflux  1999   TONSILLECTOMY     TRANSFORAMINAL LUMBAR INTERBODY FUSION (TLIF) WITH PEDICLE SCREW FIXATION 1 LEVEL Left 04/04/2020   Procedure: Left Lumbar four-five Transforaminal lumbar interbody fusion;  Surgeon: Maeola Harman, MD;  Location: [facility];  Service: Neurosurgery;  Laterality: Left;   UPPER GASTROINTESTINAL ENDOSCOPY  2006    ALLERGIES: Allergies  Allergen Reactions   Latex    Mirtazapine Nausea Only   Naproxen Sodium     Upsets stomach   Rofecoxib    Statins Other (See Comments)    Muscle aches   Tape    Zolpidem Tartrate Other (See Comments)    Ambien= confusion   Elemental Sulfur Rash   Erythromycin Rash   Penicillins Rash   Sulfa Antibiotics Hives and Rash   Tetracyclines & Related Rash    FAMILY HISTORY:  Family History  Problem Relation Age of Onset   Drug abuse Other    Depression Mother    Asthma Mother    COPD Mother    Anxiety disorder Maternal Grandmother    Depression Maternal Grandmother    COPD Sister     SOCIAL HISTORY: Social History   Socioeconomic History   Marital status: Married    Spouse name: Lenamae Droge   Number of children: 1   Years of education: 12   Highest education level: 12th grade  Occupational History   Not on file  Tobacco Use   Smoking status: Never    Passive exposure: Never   Smokeless tobacco: Never  Vaping Use   Vaping status: Never Used  Substance and Sexual Activity   Alcohol use: Yes    Comment: occ wine   Drug use: No   Sexual activity: Yes    Partners: Male    Birth control/protection: Surgical  Other Topics Concern   Not on file  Social History Narrative   Not on file   Social Determinants of Health   Financial Resource Strain: Low Risk  (11/14/2021)   Overall Financial Resource Strain (CARDIA)    Difficulty of Paying Living Expenses: Not hard at all  Food Insecurity: No  Food Insecurity (11/14/2021)   Hunger Vital Sign    Worried About Running Out of Food in the Last Year: Never true    Ran Out of Food in the Last Year: Never true  Transportation Needs: No Transportation Needs (11/14/2021)   PRAPARE - Administrator, Civil Service (Medical): No    Lack of Transportation (Non-Medical): No  Physical Activity: Insufficiently Active (11/14/2021)   Exercise Vital Sign    Days of Exercise  per Week: 3 days    Minutes of Exercise per Session: 30 min  Stress: No Stress Concern Present (11/14/2021)   [employer] of Occupational Health - Occupational Stress Questionnaire    Feeling of Stress : Only a little  Social Connections: Socially Integrated (11/14/2021)   Social Connection and Isolation Panel [NHANES]    Frequency of Communication with Friends and Family: More than three times a week    Frequency of Social Gatherings with Friends and Family: More than three times a week    Attends Religious Services: More than 4 times per year    Active Member of Clubs or Organizations: Yes    Attends Engineer, structural: More than 4 times per year    Marital Status: Married    MEDICATIONS:  Current Outpatient Medications  Medication Sig Dispense Refill   amitriptyline (ELAVIL) 50 MG tablet Take 50 mg by mouth at bedtime.     Calcium Carbonate-Vitamin D (CALCIUM + D PO) Take 600 mg by mouth daily.     cholecalciferol (VITAMIN D3) 25 MCG (1000 UNIT) tablet Take 2,000 Units by mouth daily.     diltiazem (CARDIZEM CD) 180 MG 24 hr capsule Take 180 mg by mouth daily.     glimepiride (AMARYL) 2 MG tablet Take 2 mg by mouth in the morning and at bedtime.     MAGNESIUM PO Take by mouth.     rosuvastatin (CRESTOR) 5 MG tablet Take 5 mg by mouth daily.     tirzepatide [facility]) 5 MG/0.5ML Pen Inject 5 mg into the skin once a week. 6 mL 4   traZODone (DESYREL) 100 MG tablet Take 100 mg by mouth at bedtime.     cyclobenzaprine (FLEXERIL) 10 MG tablet Take  1 tablet (10 mg total) by mouth 2 (two) times daily as needed for muscle spasms. 20 tablet 0   gabapentin (NEURONTIN) 100 MG capsule Take 1 capsule (100 mg total) by mouth 3 (three) times daily. 90 capsule 2   methocarbamol (ROBAXIN) 500 MG tablet Take by mouth.     nitroGLYCERIN (NITROSTAT) 0.4 MG SL tablet Place 1 tablet (0.4 mg total) under the tongue every 5 (five) minutes as needed for chest pain. (Patient not taking: Reported on 08/14/2022) 30 tablet 0   oxyCODONE (ROXICODONE) 5 MG immediate release tablet Take 1 tablet (5 mg total) by mouth every 6 (six) hours as needed for severe pain or breakthrough pain. (Patient not taking: Reported on 08/14/2022) 12 tablet 0   predniSONE (DELTASONE) 10 MG tablet Take 1 tablet (10 mg total) by mouth 3 (three) times daily. 42 tablet 0   tiZANidine (ZANAFLEX) 4 MG tablet Take 1 tablet (4 mg total) by mouth every 6 (six) hours as needed for muscle spasms. 30 tablet 0   No current facility-administered medications for this visit.    PHYSICAL EXAM: Vitals:   12/17/22 0953  BP: 120/80  Pulse: 94  SpO2: 98%  Weight: 177 lb 12.8 oz (80.6 kg)  Height: 5\' 4"  (1.626 m)   Body mass index is 30.52 kg/m.  Wt Readings from Last 3 Encounters:  12/17/22 177 lb 12.8 oz (80.6 kg)  08/14/22 167 lb (75.8 kg)  04/04/20 182 lb 14.4 oz (83 kg)    General: Well developed, well nourished female in no apparent distress.  HEENT: AT/[place], no external lesions.  Eyes: Conjunctiva clear and no icterus. Neck: Neck supple , no thyromegaly, no palpable thyroid nodule Lungs: Respirations not labored Neurologic: Alert, oriented, normal speech,  DTR 2+ Extremities / Skin: Dry. No sores or rashes noted.  Psychiatric: Does not appear depressed or anxious  Diabetic Foot Exam - Simple   No data filed     LABS Reviewed Lab Results  Component Value Date   HGBA1C 6.5 (A) 12/17/2022   HGBA1C 7.4 (H) 04/02/2020   No results found for: "FRUCTOSAMINE" No results found for:  "CHOL", "HDL", "LDLCALC", "LDLDIRECT", "TRIG", "CHOLHDL" No results found for: "MICRALBCREAT" Lab Results  Component Value Date   CREATININE 1.02 (H) 07/12/2022   No results found for: "GFR"  ASSESSMENT / PLAN  1. Type 2 diabetes mellitus without complication, without long-term current use of insulin (HCC)   2. Low TSH level     Diabetes Mellitus type 2, complicated by no known complications. - Diabetic status / severity: Controlled  Lab Results  Component Value Date   HGBA1C 6.5 (A) 12/17/2022    - Hemoglobin A1c goal : <7%  Patient reports she takes frequent systemic steroids/prednisone and Decadron.  Which worsened her diabetes status.  With the start of Mounjaro diabetes control has improved.  She has no personal history of pancreatitis, no family history of medullary thyroid cancer and MEM 2 syndrome.  Discussed about diabetes mellitus and importance of blood sugar control to avoid chronic diabetic complications.  Will increase Mounjaro as tolerated to higher dose to have weight loss benefit.  - Medications: See below  I) increase Mounjaro to 5 mg weekly. II) continue glimepiride 2 mg daily for now.  Will consider to stop in future visits.  - Home glucose testing: In the morning fasting and occasionally other times of the day. - Discussed/ Gave Hypoglycemia treatment plan.  # Consult : not required at this time.   # Annual urine for microalbuminuria/ creatinine ratio, no microalbuminuria currentl. Last No results found for: "MICRALBCREAT"  # Foot check nightly.  # Annual dilated diabetic eye exams.   - Diet: Make healthy diabetic food choices - Life style / activity / exercise: Discussed.  2. Blood pressure  -  BP Readings from Last 1 Encounters:  12/17/22 120/80    - Control is in target.  - No change in current plans.  3. Lipid status / Hyperlipidemia - Last No results found for: "LDLCALC" LDL 111. - Continue Crestor 5 mg daily.  Managed by  primary care provider.  # Low TSH -She had mildly low TSH of 0.415 with lower normal limit of 0.450 in February 2024.  She is clinically euthyroid no hyperthyroid symptoms.  She is not sure however she was probably on prednisone at that time.  Mildly low TSH can also be related with taking steroid. -Will check thyroid function test TSH, free T4, free T3 -Check thyroid antibody thyrotropin receptor antibody for autoimmune hypothyroidism.  Tonantzin was seen today for diabetes.  Diagnoses and all orders for this visit:  Type 2 diabetes mellitus without complication, without long-term current use of insulin (HCC) -     POCT glycosylated hemoglobin (Hb A1C)  Low TSH level -     T3, free; Future -     T4, free; Future -     TSH; Future -     TRAb (TSH Receptor Binding Antibody); Future -     TRAb (TSH Receptor Binding Antibody) -     TSH -     T4, free -     T3, free  Other orders -     tirzepatide (MOUNJARO) 5 MG/0.5ML Pen; Inject 5 mg into the  skin once a week.    DISPOSITION Follow up in clinic in 3 months suggested.   All questions answered and patient verbalized understanding of the plan.  [place] Jkai Arwood, MD [facility] Endocr[facility] [address] [place], [address] [place], [place] [address] Phone # [phone]  At least part of this note was generated using voice recognition software. Inadvertent word errors may have occurred, which were not recognized during the proofreading process.

## 2022-12-17 NOTE — Patient Instructions (Signed)
Increase mounjaro to 5mg  weekly.  Glimepiride 2 mg daily.  We will check thyroid lab today.

## 2022-12-18 LAB — TRAB (TSH RECEPTOR BINDING ANTIBODY)

## 2022-12-30 ENCOUNTER — Encounter: Payer: Self-pay | Admitting: Podiatry

## 2022-12-30 ENCOUNTER — Ambulatory Visit (INDEPENDENT_AMBULATORY_CARE_PROVIDER_SITE_OTHER): Payer: Medicare PPO | Admitting: Podiatry

## 2022-12-30 DIAGNOSIS — G5782 Other specified mononeuropathies of left lower limb: Secondary | ICD-10-CM

## 2022-12-30 DIAGNOSIS — S86219A Strain of muscle(s) and tendon(s) of anterior muscle group at lower leg level, unspecified leg, initial encounter: Secondary | ICD-10-CM

## 2022-12-30 MED ORDER — TRIAMCINOLONE ACETONIDE 40 MG/ML IJ SUSP
20.0000 mg | Freq: Once | INTRAMUSCULAR | Status: AC
Start: 2022-12-30 — End: 2022-12-30
  Administered 2022-12-30: 20 mg

## 2022-12-30 NOTE — Progress Notes (Signed)
She presents today walking with a walker after having injured her back in April of this year as she fell at the hospital while she was there to be seen for her right sciatic pain.  She states that she fell in the bathroom hitting the floor hard.  She went to different doctors and ended up with Dr. Juliene Pina.  He states that after the nerve conduction velocity exam he feels that she has sciatic notch syndrome or pain.  This is on the left the right.  He also noticed that she had dropfoot.  We had previously treated her for a tibialis anterior tendon tear last year but after the fall she states that the area is still tender and she is unable to raise her foot up.  Objective: Vital signs are stable alert and oriented x 3.  She has pain on palpation to the second and third interdigital spaces of her left foot she has no tibialis anterior tendon palpable at this point.  All of the other tendons remain palpable.  Reviewed previous radiographs not demonstrating significant osseous abnormalities.  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  Complete rupture of the tibialis anterior tendon left.  Plan: Discussed etiology pathology conservative surgical therapies at this point I injected 10 mg Kenalog 5 mg Marcaine around the second and third metatarsal phalangeal joints.  We are requesting MRI of the left ankle to determine the extent of this rupture.  This is for surgical repair

## 2022-12-31 DIAGNOSIS — Z6830 Body mass index (BMI) 30.0-30.9, adult: Secondary | ICD-10-CM | POA: Diagnosis not present

## 2022-12-31 DIAGNOSIS — M25552 Pain in left hip: Secondary | ICD-10-CM | POA: Diagnosis not present

## 2023-01-15 DIAGNOSIS — Z23 Encounter for immunization: Secondary | ICD-10-CM | POA: Diagnosis not present

## 2023-01-16 ENCOUNTER — Ambulatory Visit
Admission: RE | Admit: 2023-01-16 | Discharge: 2023-01-16 | Disposition: A | Discharge status: Home / Self Care | Payer: Medicare PPO | Source: Ambulatory Visit | Attending: Podiatry | Admitting: Podiatry

## 2023-01-16 DIAGNOSIS — S86219A Strain of muscle(s) and tendon(s) of anterior muscle group at lower leg level, unspecified leg, initial encounter: Secondary | ICD-10-CM

## 2023-01-16 DIAGNOSIS — M25572 Pain in left ankle and joints of left foot: Secondary | ICD-10-CM | POA: Diagnosis not present

## 2023-01-16 DIAGNOSIS — M7732 Calcaneal spur, left foot: Secondary | ICD-10-CM | POA: Diagnosis not present

## 2023-01-22 DIAGNOSIS — M25552 Pain in left hip: Secondary | ICD-10-CM | POA: Diagnosis not present

## 2023-01-22 DIAGNOSIS — R269 Unspecified abnormalities of gait and mobility: Secondary | ICD-10-CM | POA: Diagnosis not present

## 2023-01-30 DIAGNOSIS — R269 Unspecified abnormalities of gait and mobility: Secondary | ICD-10-CM | POA: Diagnosis not present

## 2023-01-30 DIAGNOSIS — M25552 Pain in left hip: Secondary | ICD-10-CM | POA: Diagnosis not present

## 2023-02-04 DIAGNOSIS — M25552 Pain in left hip: Secondary | ICD-10-CM | POA: Diagnosis not present

## 2023-02-04 DIAGNOSIS — R269 Unspecified abnormalities of gait and mobility: Secondary | ICD-10-CM | POA: Diagnosis not present

## 2023-02-06 ENCOUNTER — Other Ambulatory Visit: Payer: Self-pay

## 2023-02-06 ENCOUNTER — Telehealth: Payer: Self-pay

## 2023-02-06 DIAGNOSIS — M66872 Spontaneous rupture of other tendons, left ankle and foot: Secondary | ICD-10-CM

## 2023-02-06 NOTE — Telephone Encounter (Signed)
Hi this pt called in to ask you to send her referral to [facility] in [place]

## 2023-02-18 DIAGNOSIS — M25552 Pain in left hip: Secondary | ICD-10-CM | POA: Diagnosis not present

## 2023-02-18 DIAGNOSIS — R269 Unspecified abnormalities of gait and mobility: Secondary | ICD-10-CM | POA: Diagnosis not present

## 2023-02-19 ENCOUNTER — Ambulatory Visit: Payer: Medicare PPO | Admitting: Podiatry

## 2023-02-19 ENCOUNTER — Encounter: Payer: Self-pay | Admitting: Podiatry

## 2023-02-19 DIAGNOSIS — G629 Polyneuropathy, unspecified: Secondary | ICD-10-CM | POA: Diagnosis not present

## 2023-02-19 MED ORDER — GABAPENTIN 300 MG PO CAPS: 300.0000 mg | ORAL_CAPSULE | Freq: Three times a day (TID) | ORAL | 3 refills | Status: DC

## 2023-02-22 NOTE — Progress Notes (Signed)
She presents today for follow-up of her MRI report.  MRI was consistent with osteochondral defect noted tibialis anterior tendon tear.  Objective: Vital signs are stable alert oriented x 3 she is still complaining of daytime and primarily nocturnal pain close the top of the foot dorsal medial arch area.  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  Plan: Started on gabapentin 300 mg 1 p.o. 3 times daily follow-up with her in 1 month I would like her to go to physical therapy to help with her symptomatology.

## 2023-02-24 ENCOUNTER — Other Ambulatory Visit: Payer: Self-pay

## 2023-02-24 ENCOUNTER — Telehealth: Payer: Self-pay

## 2023-02-24 DIAGNOSIS — M66872 Spontaneous rupture of other tendons, left ankle and foot: Secondary | ICD-10-CM

## 2023-02-24 DIAGNOSIS — G5782 Other specified mononeuropathies of left lower limb: Secondary | ICD-10-CM

## 2023-02-24 NOTE — Telephone Encounter (Signed)
PT Referral, office note and demographics faxed to [facility] @ [facility] Phone [phone] Fax [phone]

## 2023-03-02 DIAGNOSIS — R269 Unspecified abnormalities of gait and mobility: Secondary | ICD-10-CM | POA: Diagnosis not present

## 2023-03-02 DIAGNOSIS — M25552 Pain in left hip: Secondary | ICD-10-CM | POA: Diagnosis not present

## 2023-03-09 ENCOUNTER — Encounter: Payer: Self-pay | Admitting: Endocrinology

## 2023-03-09 NOTE — Telephone Encounter (Signed)
Stop taking Amaryl/glimepiride for now due to low blood sugar, will discuss further on coming follow-up visit on December 12.

## 2023-03-18 DIAGNOSIS — M25552 Pain in left hip: Secondary | ICD-10-CM | POA: Diagnosis not present

## 2023-03-18 DIAGNOSIS — R269 Unspecified abnormalities of gait and mobility: Secondary | ICD-10-CM | POA: Diagnosis not present

## 2023-03-19 ENCOUNTER — Encounter: Payer: Self-pay | Admitting: Endocrinology

## 2023-03-19 ENCOUNTER — Ambulatory Visit (INDEPENDENT_AMBULATORY_CARE_PROVIDER_SITE_OTHER): Payer: Medicare PPO | Admitting: Endocrinology

## 2023-03-19 VITALS — BP 138/80 | HR 94 | Resp 20 | Ht 64.0 in | Wt 174.4 lb

## 2023-03-19 DIAGNOSIS — E119 Type 2 diabetes mellitus without complications: Secondary | ICD-10-CM

## 2023-03-19 DIAGNOSIS — Z7985 Long-term (current) use of injectable non-insulin antidiabetic drugs: Secondary | ICD-10-CM | POA: Diagnosis not present

## 2023-03-19 DIAGNOSIS — E042 Nontoxic multinodular goiter: Secondary | ICD-10-CM

## 2023-03-19 LAB — POCT GLYCOSYLATED HEMOGLOBIN (HGB A1C): Hemoglobin A1C: 5.3 % (ref 4.0–5.6)

## 2023-03-19 MED ORDER — BLOOD GLUCOSE TEST VI STRP: 1.0000 | ORAL_STRIP | Freq: Every day | 3 refills | Status: AC

## 2023-03-19 MED ORDER — LANCETS MISC. MISC: 1.0000 | Freq: Every day | 3 refills | Status: AC

## 2023-03-19 MED ORDER — BLOOD GLUCOSE MONITORING SUPPL DEVI: 1.0000 | Freq: Three times a day (TID) | 0 refills | Status: AC

## 2023-03-19 MED ORDER — LANCET DEVICE MISC: 1.0000 | Freq: Three times a day (TID) | 0 refills | Status: AC

## 2023-03-19 NOTE — Progress Notes (Signed)
Outpatient Endocrinology Note [place] Archer Vise, MD  03/19/23  Patient's Name: Mindy Ellis    DOB: [birth year]    MRN: [id number]                                                    REASON OF VISIT: Follow-up of type 2 diabetes mellitus  REFERRING PROVIDER:  Assunta Found, MD  PCP: Assunta Found, MD  HISTORY OF PRESENT ILLNESS:   Mindy Ellis is a 72 y.o. old female with past medical history listed below, is here for follow-up of type 2 diabetes mellitus.   Pertinent Diabetes History: Patient was diagnosed with type 2 diabetes mellitus around 2010. She takes oral and injectable steroid frequently for back pain/sciatic pain, worsening her diabetes control.  She has no personal history of pancreatitis, no family history of medullary thyroid cancer and MEM 2 syndrome.  Chronic Diabetes Complications : Retinopathy: no. Last ophthalmology exam was done on annually, reportedly. Nephropathy: CKD Peripheral neuropathy: yes ?, gabapentin. Coronary artery disease: no Stroke: no  Relevant comorbidities and cardiovascular risk factors: Obesity: yes Body mass index is 29.94 kg/m.  Hypertension: yes Hyperlipidemia. Yes, on a statin.  Current / Home Diabetic regimen includes: Mounjaro 5 mg weekly. Glimepiride 2 mg two times day.  Greggory Keen was started in July, 2024.  Prior diabetic medications: Metformin Januvia Glimepiride stopped in December 2024 due to improvement of diabetes control and hypoglycemia.  Glycemic data:   Not able to download glucometer in the clinic today.  Glucose log from her phone noted glucose are in the range of 75-low 100s, mostly 80s and 90s.  She had occasional hypoglycemia she reported it was 58 in the morning 10 AM on November 8, blood sugar 61 on November 7, she had hypoglycemia and hypoglycemic symptoms on Thanksgiving, does not recall the actual glucose numbers.  Hypoglycemia: Patient has minor hypoglycemic episodes. Patient has hypoglycemia  awareness.  Factors modifying glucose control: 1.  Diabetic diet assessment: 3 meals a day.  2.  Staying active or exercising: Limited physical activity, no formal exercise due to sciatic pain and back pain.  3.  Medication compliance: compliant all of the time.  # Thyroid nodules: Ultrasound thyroid in January 2023 assistant with heterogeneous multinodular thyroid gland with bilateral thyroid nodules mostly spongiform and complex.  Right spongiform nodule measuring 2.2 cm, isthmus is spongiform nodule measuring 1.5 cm, left complex nodule measuring 2.2 cm and left spongiform nodule measuring 1.4 cm.  Stable compared to ultrasound in July 2021.  # Low TSH Outside lab records reviewed, TSH 0. 415 in 05/2022 (reference range0.450 - 4.500), mildly low TSH at that time, she had normal TSH in the past 0.462.  Patient denies complaints of palpitation, heat intolerance.  No change in bowel habit.  No weight loss.  No neck discomfort or dysphagia.  No redness or watering of the eyes.  Family history of thyroid disorder in mother.  Repeat thyroid function test in September 2024 on initial visit was normal as follows.   Latest Reference Range & Units 12/17/22 10:32  TSH 0.35 - 5.50 uIU/mL 0.78  Triiodothyronine,Free,Serum 2.3 - 4.2 pg/mL 3.5  T4,Free(Direct) 0.60 - 1.60 ng/dL 4.16    Interval history Patient glucose log reviewed as above.  She has been taking Mounjaro 5 mg weekly and glimepiride 2 mg 2 times a  day.  She had occasional hypoglycemia.  Hemoglobin A1c improved to 5.3% today.  She has numbness and ting of the feet and has been taking gabapentin.  She lost about 3 pounds of weight from last visit.  No GI issue, tolerating Mounjaro well.  No other complaints today.  REVIEW OF SYSTEMS As per history of present illness.   PAST MEDICAL HISTORY: Past Medical History:  Diagnosis Date   Abdominal pain    Acid reflux    Chronic diarrhea    DDD (degenerative disc disease)    Depression     Diabetes mellitus without complication (HCC)    diet controlled   Dysphagia    Fibromyalgia    GERD (gastroesophageal reflux disease)    Hypertension    Meningioma (HCC)    PONV (postoperative nausea and vomiting)    nausea, pt must not vomit due to reflux surgery    PAST SURGICAL HISTORY: Past Surgical History:  Procedure Laterality Date   ABDOMINAL HYSTERECTOMY  age 43   ANTERIOR AND POSTERIOR REPAIR N/A 01/18/2013   Procedure: ANTERIOR (CYSTOCELE) ;  Surgeon: Martina Sinner, MD;  Location: [facility];  Service: Urology;  Laterality: N/A;   APPENDECTOMY  age 45   both ovaries removed   BREAST SURGERY Left 21 yrs ago   benign lump removed   CHOLECYSTECTOMY     COLONOSCOPY  11/11/07   NUR   CYSTOSCOPY N/A 01/18/2013   Procedure: CYSTOSCOPY;  Surgeon: Martina Sinner, MD;  Location: [facility];  Service: Urology;  Laterality: N/A;   EYE SURGERY     lasix eye surgery Bilateral    PROCTOSCOPY  01/18/2013   Procedure: PROCTOSCOPY;  Surgeon: Martina Sinner, MD;  Location: [facility];  Service: Urology;;   surgery  for acid reflux  1999   TONSILLECTOMY     TRANSFORAMINAL LUMBAR INTERBODY FUSION (TLIF) WITH PEDICLE SCREW FIXATION 1 LEVEL Left 04/04/2020   Procedure: Left Lumbar four-five Transforaminal lumbar interbody fusion;  Surgeon: Maeola Harman, MD;  Location: [facility];  Service: Neurosurgery;  Laterality: Left;   UPPER GASTROINTESTINAL ENDOSCOPY  2006    ALLERGIES: Allergies  Allergen Reactions   Latex    Mirtazapine Nausea Only   Naproxen Sodium     Upsets stomach   Rofecoxib    Statins Other (See Comments)    Muscle aches   Tape    Zolpidem Tartrate Other (See Comments)    Ambien= confusion   Elemental Sulfur Rash   Erythromycin Rash   Penicillins Rash   Sulfa Antibiotics Hives and Rash   Tetracyclines & Related Rash    FAMILY HISTORY:  Family History  Problem Relation Age of Onset   Drug abuse Other    Depression Mother    Asthma Mother    COPD Mother     Anxiety disorder Maternal Grandmother    Depression Maternal Grandmother    COPD Sister     SOCIAL HISTORY: Social History   Socioeconomic History   Marital status: Married    Spouse name: Shamel Lauwers   Number of children: 1   Years of education: 12   Highest education level: 12th grade  Occupational History   Not on file  Tobacco Use   Smoking status: Never    Passive exposure: Never   Smokeless tobacco: Never  Vaping Use   Vaping status: Never Used  Substance and Sexual Activity   Alcohol use: Yes    Comment: occ wine   Drug use: No  Sexual activity: Yes    Partners: Male    Birth control/protection: Surgical  Other Topics Concern   Not on file  Social History Narrative   Not on file   Social Drivers of Health   Financial Resource Strain: Low Risk  (11/14/2021)   Overall Financial Resource Strain (CARDIA)    Difficulty of Paying Living Expenses: Not hard at all  Food Insecurity: No Food Insecurity (11/14/2021)   Hunger Vital Sign    Worried About Running Out of Food in the Last Year: Never true    Ran Out of Food in the Last Year: Never true  Transportation Needs: No Transportation Needs (11/14/2021)   PRAPARE - Administrator, Civil Service (Medical): No    Lack of Transportation (Non-Medical): No  Physical Activity: Insufficiently Active (11/14/2021)   Exercise Vital Sign    Days of Exercise per Week: 3 days    Minutes of Exercise per Session: 30 min  Stress: No Stress Concern Present (11/14/2021)   [employer] of Occupational Health - Occupational Stress Questionnaire    Feeling of Stress : Only a little  Social Connections: Socially Integrated (11/14/2021)   Social Connection and Isolation Panel [NHANES]    Frequency of Communication with Friends and Family: More than three times a week    Frequency of Social Gatherings with Friends and Family: More than three times a week    Attends Religious Services: More than 4 times per year     Active Member of Clubs or Organizations: Yes    Attends Engineer, structural: More than 4 times per year    Marital Status: Married    MEDICATIONS:  Current Outpatient Medications  Medication Sig Dispense Refill   Blood Glucose Monitoring Suppl DEVI 1 each by Does not apply route in the morning, at noon, and at bedtime. May substitute to any manufacturer covered by patient's insurance. 1 each 0   Glucose Blood (BLOOD GLUCOSE TEST STRIPS) STRP 1 each by In Vitro route daily. May substitute to any manufacturer covered by patient's insurance. 100 each 3   Lancet Device MISC 1 each by Does not apply route in the morning, at noon, and at bedtime. May substitute to any manufacturer covered by patient's insurance. 1 each 0   Lancets Misc. MISC 1 each by Does not apply route daily. May substitute to any manufacturer covered by patient's insurance. 100 each 3   amitriptyline (ELAVIL) 50 MG tablet Take 50 mg by mouth at bedtime.     Calcium Carbonate-Vitamin D (CALCIUM + D PO) Take 600 mg by mouth daily.     cholecalciferol (VITAMIN D3) 25 MCG (1000 UNIT) tablet Take 2,000 Units by mouth daily.     diltiazem (CARDIZEM CD) 180 MG 24 hr capsule Take 180 mg by mouth daily.     gabapentin (NEURONTIN) 300 MG capsule Take 1 capsule (300 mg total) by mouth 3 (three) times daily. 90 capsule 3   MAGNESIUM PO Take by mouth.     rosuvastatin (CRESTOR) 5 MG tablet Take 5 mg by mouth daily.     tirzepatide [facility]) 5 MG/0.5ML Pen Inject 5 mg into the skin once a week. 6 mL 4   traZODone (DESYREL) 100 MG tablet Take 100 mg by mouth at bedtime.     No current facility-administered medications for this visit.    PHYSICAL EXAM: Vitals:   03/19/23 1309  BP: 138/80  Pulse: 94  Resp: 20  SpO2: 98%  Weight: 174 lb  6.4 oz (79.1 kg)  Height: 5\' 4"  (1.626 m)    Body mass index is 29.94 kg/m.  Wt Readings from Last 3 Encounters:  03/19/23 174 lb 6.4 oz (79.1 kg)  12/17/22 177 lb 12.8 oz (80.6 kg)   08/14/22 167 lb (75.8 kg)    General: Well developed, well nourished female in no apparent distress.  HEENT: AT/, no external lesions.  Eyes: Conjunctiva clear and no icterus. Neck: Neck supple , no thyromegaly, no palpable thyroid nodule Lungs: Respirations not labored Neurologic: Alert, oriented, normal speech, DTR 2+ Extremities / Skin: Dry.   Psychiatric: Does not appear depressed or anxious  Diabetic Foot Exam - Simple   Simple Foot Form Diabetic Foot exam was performed with the following findings: Yes 03/19/2023  1:29 PM  Visual Inspection No deformities, no ulcerations, no other skin breakdown bilaterally: Yes Sensation Testing Intact to touch and monofilament testing bilaterally: Yes Pulse Check Comments DP 2+ bilaterally.      LABS Reviewed Lab Results  Component Value Date   HGBA1C 5.3 03/19/2023   HGBA1C 6.5 (A) 12/17/2022   HGBA1C 7.4 (H) 04/02/2020   No results found for: "FRUCTOSAMINE" No results found for: "CHOL", "HDL", "LDLCALC", "LDLDIRECT", "TRIG", "CHOLHDL" No results found for: "MICRALBCREAT" Lab Results  Component Value Date   CREATININE 1.02 (H) 07/12/2022   No results found for: "GFR"  ASSESSMENT / PLAN  1. Type 2 diabetes mellitus without complication, without long-term current use of insulin (HCC)   2. Multiple thyroid nodules     Diabetes Mellitus type 2, complicated by no known complications. - Diabetic status / severity: Controlled  Lab Results  Component Value Date   HGBA1C 5.3 03/19/2023    - Hemoglobin A1c goal : <7%  Diabetes control has improved hemoglobin A1c improved to 5.3%.  She is having occasional hypoglycemia.  Will stop glimepiride.  - Medications: See below  I) continue Mounjaro 5 mg weekly. II) stop glimepiride.  Will consider increasing Mounjaro to have weight loss benefit in the future visit as tolerated.  - Home glucose testing: In the morning fasting and occasionally other times of the day. -  Discussed/ Gave Hypoglycemia treatment plan.  # Consult : not required at this time.   # Annual urine for microalbuminuria/ creatinine ratio, no microalbuminuria currently, will check today. Last No results found for: "MICRALBCREAT"  # Foot check nightly. ?  Diabetic neuropathy, on gabapentin by primary care provider, ?  Podiatry.  # Annual dilated diabetic eye exams.   - Diet: Make healthy diabetic food choices - Life style / activity / exercise: Discussed.  2. Blood pressure  -  BP Readings from Last 1 Encounters:  03/19/23 138/80    - Control is in target.  - No change in current plans.  3. Lipid status / Hyperlipidemia - Last No results found for: "LDLCALC" LDL 111. - Continue Crestor 5 mg daily.  Managed by primary care provider.  # Multiple thyroid nodules -She has bilateral multiple thyroid nodules, ultrasound thyroid in July 2021 and January 2023 showed mostly spongiform and complex thyroid nodule.  No worrisome features.  No indication of needle biopsy at this time.  These nodules needs monitoring with serial ultrasound.    Diagnoses and all orders for this visit:  Type 2 diabetes mellitus without complication, without long-term current use of insulin (HCC) -     POCT glycosylated hemoglobin (Hb A1C) -     Microalbumin / creatinine urine ratio  Multiple thyroid nodules  Other  orders -     Blood Glucose Monitoring Suppl DEVI; 1 each by Does not apply route in the morning, at noon, and at bedtime. May substitute to any manufacturer covered by patient's insurance. -     Glucose Blood (BLOOD GLUCOSE TEST STRIPS) STRP; 1 each by In Vitro route daily. May substitute to any manufacturer covered by patient's insurance. -     Lancet Device MISC; 1 each by Does not apply route in the morning, at noon, and at bedtime. May substitute to any manufacturer covered by patient's insurance. -     Lancets Misc. MISC; 1 each by Does not apply route daily. May substitute to any  manufacturer covered by patient's insurance.     DISPOSITION Follow up in clinic in 3 months suggested.   All questions answered and patient verbalized understanding of the plan.  [place] Tahlia Deamer, MD [facility] Endocrinology [facility] [address] [place], [address] [place], [place] [address] Phone # [phone]  At least part of this note was generated using voice recognition software. Inadvertent word errors may have occurred, which were not recognized during the proofreading process.

## 2023-03-19 NOTE — Patient Instructions (Signed)
Stop glimepiride.   Take Mounjaro 5 mg weekly.

## 2023-03-20 LAB — MICROALBUMIN / CREATININE URINE RATIO
Creatinine, Urine: 95 mg/dL (ref 20–275)
Microalb Creat Ratio: 18 mg/g{creat} (ref <30)
Microalb, Ur: 1.7 mg/dL

## 2023-03-24 ENCOUNTER — Ambulatory Visit: Payer: Medicare PPO | Admitting: Podiatry

## 2023-04-15 DIAGNOSIS — D329 Benign neoplasm of meninges, unspecified: Secondary | ICD-10-CM | POA: Diagnosis not present

## 2023-04-15 DIAGNOSIS — Z6829 Body mass index (BMI) 29.0-29.9, adult: Secondary | ICD-10-CM | POA: Diagnosis not present

## 2023-04-15 DIAGNOSIS — M5126 Other intervertebral disc displacement, lumbar region: Secondary | ICD-10-CM | POA: Diagnosis not present

## 2023-04-16 ENCOUNTER — Ambulatory Visit: Payer: Medicare PPO | Admitting: Podiatry

## 2023-04-28 DIAGNOSIS — F419 Anxiety disorder, unspecified: Secondary | ICD-10-CM | POA: Diagnosis not present

## 2023-04-28 DIAGNOSIS — Z1331 Encounter for screening for depression: Secondary | ICD-10-CM | POA: Diagnosis not present

## 2023-04-28 DIAGNOSIS — E559 Vitamin D deficiency, unspecified: Secondary | ICD-10-CM | POA: Diagnosis not present

## 2023-04-28 DIAGNOSIS — Z6829 Body mass index (BMI) 29.0-29.9, adult: Secondary | ICD-10-CM | POA: Diagnosis not present

## 2023-04-28 DIAGNOSIS — E785 Hyperlipidemia, unspecified: Secondary | ICD-10-CM | POA: Diagnosis not present

## 2023-04-28 DIAGNOSIS — I10 Essential (primary) hypertension: Secondary | ICD-10-CM | POA: Diagnosis not present

## 2023-04-28 DIAGNOSIS — E663 Overweight: Secondary | ICD-10-CM | POA: Diagnosis not present

## 2023-04-28 DIAGNOSIS — E1159 Type 2 diabetes mellitus with other circulatory complications: Secondary | ICD-10-CM | POA: Diagnosis not present

## 2023-04-28 DIAGNOSIS — Z0001 Encounter for general adult medical examination with abnormal findings: Secondary | ICD-10-CM | POA: Diagnosis not present

## 2023-04-28 DIAGNOSIS — E538 Deficiency of other specified B group vitamins: Secondary | ICD-10-CM | POA: Diagnosis not present

## 2023-04-28 DIAGNOSIS — M797 Fibromyalgia: Secondary | ICD-10-CM | POA: Diagnosis not present

## 2023-05-14 ENCOUNTER — Ambulatory Visit (INDEPENDENT_AMBULATORY_CARE_PROVIDER_SITE_OTHER): Payer: Medicare PPO | Admitting: Podiatry

## 2023-05-14 ENCOUNTER — Other Ambulatory Visit: Payer: Self-pay

## 2023-05-14 DIAGNOSIS — M7989 Other specified soft tissue disorders: Secondary | ICD-10-CM

## 2023-05-14 DIAGNOSIS — E119 Type 2 diabetes mellitus without complications: Secondary | ICD-10-CM

## 2023-05-14 DIAGNOSIS — Z9889 Other specified postprocedural states: Secondary | ICD-10-CM | POA: Diagnosis not present

## 2023-05-14 DIAGNOSIS — S86219A Strain of muscle(s) and tendon(s) of anterior muscle group at lower leg level, unspecified leg, initial encounter: Secondary | ICD-10-CM

## 2023-05-14 MED ORDER — TIRZEPATIDE 5 MG/0.5ML ~~LOC~~ SOAJ
5.0000 mg | SUBCUTANEOUS | 4 refills | Status: DC
Start: 2023-05-14 — End: 2023-06-18

## 2023-05-14 NOTE — Progress Notes (Signed)
 She presents today for follow-up of her gabapentin  she states that is helping she is taking it 3 times a day with no side effects and states that she is doing quite well with it.  She was never able to get her physical therapy arranged because she wanted 1 in [place] at any pin and it was not sent over properly according to her.  Objective: Vital signs are stable she is alert oriented x 3 pulses are palpable.  She still has some tenderness on palpation tibialis anterior tendon but no reproducible pain otherwise.  Assessment: Resolving neuritis and nerve pain associated with her neuropathy she is also has some residual tendinitis of her tibialis anterior tendon.  Plan: At this point I recommend she continue her on gabapentin  3 times a day.  Also recommended that she get into physical therapy at any pin and reasonable and we will send over that referral.

## 2023-05-15 DIAGNOSIS — H33312 Horseshoe tear of retina without detachment, left eye: Secondary | ICD-10-CM | POA: Diagnosis not present

## 2023-05-15 DIAGNOSIS — H3589 Other specified retinal disorders: Secondary | ICD-10-CM | POA: Diagnosis not present

## 2023-05-15 DIAGNOSIS — H35431 Paving stone degeneration of retina, right eye: Secondary | ICD-10-CM | POA: Diagnosis not present

## 2023-05-15 DIAGNOSIS — H43813 Vitreous degeneration, bilateral: Secondary | ICD-10-CM | POA: Diagnosis not present

## 2023-05-15 DIAGNOSIS — H4312 Vitreous hemorrhage, left eye: Secondary | ICD-10-CM | POA: Diagnosis not present

## 2023-05-21 ENCOUNTER — Ambulatory Visit (HOSPITAL_COMMUNITY): Payer: Medicare PPO | Attending: Podiatry

## 2023-05-21 ENCOUNTER — Other Ambulatory Visit: Payer: Self-pay

## 2023-05-21 DIAGNOSIS — R2689 Other abnormalities of gait and mobility: Secondary | ICD-10-CM | POA: Diagnosis not present

## 2023-05-21 DIAGNOSIS — Z9889 Other specified postprocedural states: Secondary | ICD-10-CM | POA: Insufficient documentation

## 2023-05-21 DIAGNOSIS — M25572 Pain in left ankle and joints of left foot: Secondary | ICD-10-CM | POA: Diagnosis not present

## 2023-05-21 DIAGNOSIS — M6281 Muscle weakness (generalized): Secondary | ICD-10-CM | POA: Insufficient documentation

## 2023-05-21 DIAGNOSIS — M5416 Radiculopathy, lumbar region: Secondary | ICD-10-CM | POA: Insufficient documentation

## 2023-05-21 DIAGNOSIS — S86219A Strain of muscle(s) and tendon(s) of anterior muscle group at lower leg level, unspecified leg, initial encounter: Secondary | ICD-10-CM | POA: Diagnosis not present

## 2023-05-21 DIAGNOSIS — M7989 Other specified soft tissue disorders: Secondary | ICD-10-CM | POA: Insufficient documentation

## 2023-05-21 DIAGNOSIS — R262 Difficulty in walking, not elsewhere classified: Secondary | ICD-10-CM | POA: Diagnosis not present

## 2023-05-21 NOTE — Therapy (Addendum)
OUTPATIENT PHYSICAL THERAPY LOWER EXTREMITY EVALUATION   Patient Name: Mindy Ellis MRN: [id number] DOB:July 21, 1950, 73 y.o., female Today's Date: 05/21/2023  END OF SESSION:  PT End of Session - 05/21/23 1359     Visit Number 1    Number of Visits 8    Date for PT Re-Evaluation 06/18/23    Authorization Type [employer] Choice PPO (requested 8 visits)    PT Start Time 1300    PT Stop Time 1345    PT Time Calculation (min) 45 min    Equipment Utilized During Treatment Gait belt    Activity Tolerance Patient tolerated treatment well    Behavior During Therapy WFL for tasks assessed/performed             Past Medical History:  Diagnosis Date   Abdominal pain    Acid reflux    Chronic diarrhea    DDD (degenerative disc disease)    Depression    Diabetes mellitus without complication (HCC)    diet controlled   Dysphagia    Fibromyalgia    GERD (gastroesophageal reflux disease)    Hypertension    Meningioma (HCC)    PONV (postoperative nausea and vomiting)    nausea, pt must not vomit due to reflux surgery   Past Surgical History:  Procedure Laterality Date   ABDOMINAL HYSTERECTOMY  age 68   ANTERIOR AND POSTERIOR REPAIR N/A 01/18/2013   Procedure: ANTERIOR (CYSTOCELE) ;  Surgeon: Martina Sinner, MD;  Location: [facility];  Service: Urology;  Laterality: N/A;   APPENDECTOMY  age 66   both ovaries removed   BREAST SURGERY Left 21 yrs ago   benign lump removed   CHOLECYSTECTOMY     COLONOSCOPY  11/11/07   NUR   CYSTOSCOPY N/A 01/18/2013   Procedure: CYSTOSCOPY;  Surgeon: Martina Sinner, MD;  Location: [facility];  Service: Urology;  Laterality: N/A;   EYE SURGERY     lasix eye surgery Bilateral    PROCTOSCOPY  01/18/2013   Procedure: PROCTOSCOPY;  Surgeon: Martina Sinner, MD;  Location: [facility];  Service: Urology;;   surgery  for acid reflux  1999   TONSILLECTOMY     TRANSFORAMINAL LUMBAR INTERBODY FUSION (TLIF) WITH PEDICLE SCREW FIXATION 1 LEVEL Left  04/04/2020   Procedure: Left Lumbar four-five Transforaminal lumbar interbody fusion;  Surgeon: Maeola Harman, MD;  Location: [facility];  Service: Neurosurgery;  Laterality: Left;   UPPER GASTROINTESTINAL ENDOSCOPY  2006   Patient Active Problem List   Diagnosis Date Noted   Left foot drop 09/30/2022   Primary osteoarthritis of right hip 07/16/2020   Acute right hip pain 06/27/2020   Elevated blood-pressure reading, without diagnosis of hypertension 04/25/2020   Spondylolisthesis of lumbar region 04/04/2020   Chronic bilateral low back pain with left-sided sciatica 03/05/2020   Herniated nucleus pulposus, lumbar 03/05/2020   Other chronic pain 03/05/2020   Benign meningioma (HCC) 02/14/2019   Body mass index (BMI) 29.0-29.9, adult 02/14/2019   Cerebral meningioma (HCC) 08/10/2013   Hereditary and idiopathic peripheral neuropathy 07/07/2013   Type II or unspecified type diabetes mellitus without mention of complication, not stated as uncontrolled 07/07/2013   Essential hypertension 07/07/2013    PCP: Assunta Found, MD  REFERRING PROVIDER: Elinor Parkinson, [place]  REFERRING DIAG: [phone] (ICD-10-CM) - Tibialis anterior tendon tear, traumatic M79.89 (ICD-10-CM) - Mass of soft tissue of foot Z98.890 (ICD-10-CM) - Status post left foot surgery  THERAPY DIAG:  Pain in left ankle and joints  of left foot  Difficulty in walking, not elsewhere classified  Other abnormalities of gait and mobility  Muscle weakness (generalized)  Rationale for Evaluation and Treatment: Rehabilitation  ONSET DATE: 2022  SUBJECTIVE:   SUBJECTIVE STATEMENT: EVAL: Arrives to the clinic with L calf and ankle pain (see below). Also reports of tightness on the L ankle. Patient reports of some numbness/weakness on the L LE.  Denies any instability on the ankle. However, patient reports that her balance is really off. Condition started when patient had surgery for her ankle in 2022. Never had PT for the ankle. Denies  recent trauma to the ankles. Had MRI (see below) and was then referred to outpatient PT evaluation and management.  PERTINENT HISTORY: Hx of tibialis ant surgery 2022, hx of lumbar fusion 2021, neuropathy. PAIN:  Are you having pain? Yes: NPRS scale: 1/10 Pain location: L calf, around the L ankle Pain description: aching Aggravating factors: wearing shoes  Relieving factors: barefoot/wearing flip flops, elevating the legs.  PRECAUTIONS: None  RED FLAGS: None   WEIGHT BEARING RESTRICTIONS: No  FALLS:  Has patient fallen in last 6 months? No  LIVING ENVIRONMENT: Lives with: lives with their spouse Lives in: House/apartment Stairs: Yes: External: 1 steps; none Has following equipment at home: Single point cane, Walker - 4 wheeled, and shower chair  OCCUPATION: retired  PLOF: Independent and Independent with basic ADLs  PATIENT GOALS: "I like to be able to move this ankle better"  NEXT MD VISIT: when PT is done  OBJECTIVE:  Note: Objective measures were completed at Evaluation unless otherwise noted.  DIAGNOSTIC FINDINGS:  01/16/23 MRI OF THE LEFT ANKLE WITHOUT CONTRAST   TECHNIQUE: Multiplanar, multisequence MR imaging of the ankle was performed. No intravenous contrast was administered.   COMPARISON:  Left ankle and foot radiographs 09/04/2020, MRI left ankle 11/16/2020   FINDINGS: TENDONS   Peroneal: The peroneus longus and brevis tendons are intact.   Posteromedial: Intact tibialis posterior, flexor digitorum longus, and flexor hallucis longus tendons.   Anterior: The tibialis anterior, extensor hallucis longus, and extensor digitorum longus tendons are intact.   Achilles: Intact.   Plantar Fascia: Mild-to-moderate plantar calcaneal heel spur is similar to prior. No plantar fasciitis.   LIGAMENTS   Lateral: The anterior and posterior talofibular, anterior and posterior tibiofibular, and calcaneofibular ligaments are intact.   Medial: The  tibiotalar deep deltoid and tibial spring ligaments are intact.   CARTILAGE   Ankle Joint: There is focal at least partial-thickness cartilage loss within the far anteromedial aspect of the tibial find measuring only 2 mm in transverse dimension and 3 mm in AP dimension (coronal series 108, image 20 and sagittal series 107, image 13).   Subtalar Joints/Sinus Tarsi: The subtalar cartilage is intact.Fat is preserved within the sinus tarsi.   Bones: No acute fracture.   Other: The tarsal tunnel is unremarkable. The Lisfranc ligament complex is intact.   IMPRESSION: 1. Intact tibialis anterior tendon. 2. Mild focal at least partial-thickness cartilage loss within the far anteromedial aspect of the tibial plafond measuring only 2 mm in transverse dimension and 3 mm in AP dimension. 3. Mild-to-moderate plantar calcaneal heel spur is similar to prior. No plantar fasciitis.  PATIENT SURVEYS:  ABC scale 860/1600 = 53.8%  COGNITION: Overall cognitive status: Within functional limits for tasks assessed     SENSATION: Light touch: Impaired  on the L   MUSCLE LENGTH: Gastrocsoleus: moderate restriction L, mild on the R   POSTURE:  standing: slight  pronation on the L  PALPATION: No tenderness on major bony landmarks and tendons around the ankles Hypomobile towards post glide on the L talocrural joint  LOWER EXTREMITY ROM:  Active ROM Right eval Left eval  Hip flexion [facility]  Hip extension [facility]  Hip abduction [facility]  Hip adduction    Hip internal rotation    Hip external rotation    Knee flexion [facility] WFL  Knee extension [facility] WFL  Ankle dorsiflexion 20-0 40-22  Ankle plantarflexion 20-40 40-50  Ankle inversion 0-40 0-20  Ankle eversion 0-25 0-12   (Blank rows = not tested)  LOWER EXTREMITY MMT:  MMT Right eval Left eval  Hip flexion 3+ 3+  Hip extension 3- 4  Hip abduction 4- 4  Hip adduction    Hip internal rotation    Hip external rotation    Knee  flexion 4 4  Knee extension 4 4  Ankle dorsiflexion 4 2-  Ankle plantarflexion 4 4  Ankle inversion 4 3+  Ankle eversion 4 3+   (Blank rows = not tested)   FUNCTIONAL TESTS:  5 times sit to stand: 8.43 sec 2 minute walk test: 330 ft Dynamic Gait Index: 10  DGI 1. Gait level surface (1) Moderate Impairment: Walks 20', slow speed, abnormal gait pattern, evidence for imbalance. 2. Change in gait speed (1) Moderate Impairment: Makes only minor adjustments to walking speed, or accomplishes a change in speed with significant gait deviations, or changes speed but has significant gait deviations, or changes speed but loses balance but is able to recover and continue walking. 3. Gait with horizontal head turns (1) Moderate Impairment: Performs head turns with moderate change in gait velocity, slows down, staggers but recovers, can continue to walk. 4. Gait with vertical head turns 1) Moderate Impairment: Performs head turns with moderate change in gait velocity, slows down, staggers but recovers, can continue to walk. 5. Gait and pivot turn (1) Moderate Impairment: Turns slowly, requires verbal cueing, requires several small steps to catch balance following turn and stop. 6. Step over obstacle (1) Moderate Impairment: Is able to step over box but must stop, then step over. May require verbal cueing. 7. Step around obstacles (2) Mild Impairment: Is able to step around both cones, but must slow down and adjust steps to clear cones. 8. Stairs (2) Mild Impairment: Alternating feet, must use rail.  TOTAL SCORE: 10 / 24  GAIT: Distance walked: 330 ft Assistive device utilized: None Level of assistance: CGA Comments: done during , L knee flexed on midstance, L foot slap, trunk lat lean on the L, pelvic drop on L (Tredelenburg)                                                                                                                           TREATMENT DATE:  05/21/23 Evaluation and  patient education done    PATIENT EDUCATION:  Education details: Educated on the goals and course of rehab.  Educated on measures to prevent falls at home. Person educated: Patient Education method: Explanation Education comprehension: verbalized understanding  HOME EXERCISE PROGRAM: None provided to date  ASSESSMENT:  CLINICAL IMPRESSION: EVAL: Patient is a 73 y.o. female who was seen today for physical therapy evaluation and treatment for S/P L tibialis anterior surgery and ankle pain. Patient's condition is further defined by difficulty with walking due to pain, weakness, impaired proprioception, and decreased soft tissue extensibility. Skilled PT is required to address the impairments and functional limitations listed below.   OBJECTIVE IMPAIRMENTS: Abnormal gait, decreased activity tolerance, decreased balance, decreased mobility, difficulty walking, decreased ROM, decreased strength, hypomobility, impaired flexibility, and pain.   ACTIVITY LIMITATIONS: carrying, lifting, bending, standing, squatting, and stairs  PARTICIPATION LIMITATIONS: meal prep, cleaning, laundry, shopping, community activity, and yard work  PERSONAL FACTORS: Age, Time since onset of injury/illness/exacerbation, and 1-2 comorbidities: hx of lumbar fusion 2021, neuropathy  are also affecting patient's functional outcome.   REHAB POTENTIAL: Fair    CLINICAL DECISION MAKING: Evolving/moderate complexity  EVALUATION COMPLEXITY: Moderate   GOALS: Goals reviewed with patient? Yes  SHORT TERM GOALS: Target date: 06/04/23 Pt will demonstrate indep in HEP to facilitate carry-over of skilled services and improve functional outcomes Goal status: INITIAL  LONG TERM GOALS: Target date: 06/18/23  Pt will improve ABC scale score by 10% in order to demonstrate improved confidence with balance and safety during ambulation and other ADLs Baseline: 53.8% Goal status: INITIAL  2.  Pt will increase by at least 40  ft in order to demonstrate clinically significant improvement in community ambulation Baseline: 330 ft Goal status: INITIAL  3.  Pt will improve DGI by at least 3 points in order to demonstrate clinically significant improvement in balance and decreased risk for falls Baseline: 10 Goal status: INITIAL  4.  Patient will demonstrate increase in ankle DF ROM by 10 deg to facilitate ease in ambulation  Baseline: see above Goal status: INITIAL  5.  Pt will demonstrate increase in ankle DF strength 3 to 3+/5 to facilitate ease and safety in ambulation  Baseline: 2-/5 Goal status: INITIAL   PLAN:  PT FREQUENCY: 2x/week  PT DURATION: 4 weeks  PLANNED INTERVENTIONS: 97164- PT Re-evaluation, 97110-Therapeutic exercises, 97530- Therapeutic activity, 97112- Neuromuscular re-education, 97535- Self Care, 60454- Manual therapy, 09811- Gait training, 97014- Electrical stimulation (unattended), Patient/Family education, and Stair training  PLAN FOR NEXT SESSION: Provide HEP. Begin LE strengthening and mobility activities.   Tish Frederickson. Demani Mcbrien, PT, DPT, OCS Board-Certified Clinical Specialist in Orthopedic PT PT Compact Privilege # ([place]): [phone] T 05/21/2023, 2:01 PM  [employer] Auth Request  Referring diagnosis code (ICD 10)? A21.308M (ICD-10-CM) - Tibialis anterior tendon tear, traumatic M79.89 (ICD-10-CM) - Mass of soft tissue of foot Z98.890 (ICD-10-CM) - Status post left foot surgery Treatment diagnosis codes (ICD 10)? (if different than referring diagnosis) M25.572, R26.2, R26.2, R26.89, M62.81 What was this (referring dx) caused by? [x]  Surgery []  Fall [x]  Ongoing issue []  Arthritis []  Other: ____________  Laterality: []  Rt [x]  Lt []  Both  Deficits: [x]  Pain [x]  Stiffness [x]  Weakness []  Edema []  Balance Deficits []  Coordination [x]  Gait Disturbance [x]  ROM []  Other   Functional Tool Score: Dynamic Gait Index = 10  CPT codes: See Planned Interventions listed in the  Plan section of the Evaluation.

## 2023-05-27 ENCOUNTER — Ambulatory Visit (HOSPITAL_COMMUNITY): Payer: Medicare PPO | Admitting: Physical Therapy

## 2023-05-29 ENCOUNTER — Encounter (HOSPITAL_COMMUNITY): Payer: Medicare PPO | Admitting: Physical Therapy

## 2023-05-29 ENCOUNTER — Ambulatory Visit (HOSPITAL_COMMUNITY): Payer: Medicare PPO | Admitting: Physical Therapy

## 2023-05-29 DIAGNOSIS — H43813 Vitreous degeneration, bilateral: Secondary | ICD-10-CM | POA: Diagnosis not present

## 2023-05-29 DIAGNOSIS — M6281 Muscle weakness (generalized): Secondary | ICD-10-CM

## 2023-05-29 DIAGNOSIS — M5416 Radiculopathy, lumbar region: Secondary | ICD-10-CM

## 2023-05-29 DIAGNOSIS — M7989 Other specified soft tissue disorders: Secondary | ICD-10-CM | POA: Diagnosis not present

## 2023-05-29 DIAGNOSIS — R262 Difficulty in walking, not elsewhere classified: Secondary | ICD-10-CM | POA: Diagnosis not present

## 2023-05-29 DIAGNOSIS — H33312 Horseshoe tear of retina without detachment, left eye: Secondary | ICD-10-CM | POA: Diagnosis not present

## 2023-05-29 DIAGNOSIS — R2689 Other abnormalities of gait and mobility: Secondary | ICD-10-CM | POA: Diagnosis not present

## 2023-05-29 DIAGNOSIS — H3589 Other specified retinal disorders: Secondary | ICD-10-CM | POA: Diagnosis not present

## 2023-05-29 DIAGNOSIS — Z9889 Other specified postprocedural states: Secondary | ICD-10-CM | POA: Diagnosis not present

## 2023-05-29 DIAGNOSIS — H4312 Vitreous hemorrhage, left eye: Secondary | ICD-10-CM | POA: Diagnosis not present

## 2023-05-29 DIAGNOSIS — M25572 Pain in left ankle and joints of left foot: Secondary | ICD-10-CM

## 2023-05-29 DIAGNOSIS — S86219A Strain of muscle(s) and tendon(s) of anterior muscle group at lower leg level, unspecified leg, initial encounter: Secondary | ICD-10-CM | POA: Diagnosis not present

## 2023-05-29 NOTE — Therapy (Signed)
 OUTPATIENT PHYSICAL THERAPY LOWER EXTREMITY EVALUATION   Patient Name: Mindy Ellis MRN: [id number] DOB:[birth year], 73 y.o., female Today's Date: 05/29/2023  END OF SESSION:  PT End of Session - 05/29/23 1549     Visit Number 2    Number of Visits 8    Date for PT Re-Evaluation 06/18/23    Authorization Type 8 visits approved from 2/13 thru 06/20/23    Authorization - Visit Number 1    Authorization - Number of Visits 8    Progress Note Due on Visit 9    PT Start Time 1502    PT Stop Time 1545    PT Time Calculation (min) 43 min    Equipment Utilized During Treatment Gait belt    Activity Tolerance Patient tolerated treatment well    Behavior During Therapy WFL for tasks assessed/performed              Past Medical History:  Diagnosis Date   Abdominal pain    Acid reflux    Chronic diarrhea    DDD (degenerative disc disease)    Depression    Diabetes mellitus without complication (HCC)    diet controlled   Dysphagia    Fibromyalgia    GERD (gastroesophageal reflux disease)    Hypertension    Meningioma (HCC)    PONV (postoperative nausea and vomiting)    nausea, pt must not vomit due to reflux surgery   Past Surgical History:  Procedure Laterality Date   ABDOMINAL HYSTERECTOMY  age 23   ANTERIOR AND POSTERIOR REPAIR N/A 01/18/2013   Procedure: ANTERIOR (CYSTOCELE) ;  Surgeon: Martina Sinner, MD;  Location: [facility];  Service: Urology;  Laterality: N/A;   APPENDECTOMY  age 69   both ovaries removed   BREAST SURGERY Left 21 yrs ago   benign lump removed   CHOLECYSTECTOMY     COLONOSCOPY  11/11/07   NUR   CYSTOSCOPY N/A 01/18/2013   Procedure: CYSTOSCOPY;  Surgeon: Martina Sinner, MD;  Location: [facility];  Service: Urology;  Laterality: N/A;   EYE SURGERY     lasix eye surgery Bilateral    PROCTOSCOPY  01/18/2013   Procedure: PROCTOSCOPY;  Surgeon: Martina Sinner, MD;  Location: [facility];  Service: Urology;;   surgery  for acid reflux  1999    TONSILLECTOMY     TRANSFORAMINAL LUMBAR INTERBODY FUSION (TLIF) WITH PEDICLE SCREW FIXATION 1 LEVEL Left 04/04/2020   Procedure: Left Lumbar four-five Transforaminal lumbar interbody fusion;  Surgeon: Maeola Harman, MD;  Location: [facility] OR;  Service: Neurosurgery;  Laterality: Left;   UPPER GASTROINTESTINAL ENDOSCOPY  2006   Patient Active Problem List   Diagnosis Date Noted   Left foot drop 09/30/2022   Primary osteoarthritis of right hip 07/16/2020   Acute right hip pain 06/27/2020   Elevated blood-pressure reading, without diagnosis of hypertension 04/25/2020   Spondylolisthesis of lumbar region 04/04/2020   Chronic bilateral low back pain with left-sided sciatica 03/05/2020   Herniated nucleus pulposus, lumbar 03/05/2020   Other chronic pain 03/05/2020   Benign meningioma (HCC) 02/14/2019   Body mass index (BMI) 29.0-29.9, adult 02/14/2019   Cerebral meningioma (HCC) 08/10/2013   Hereditary and idiopathic peripheral neuropathy 07/07/2013   Type II or unspecified type diabetes mellitus without mention of complication, not stated as uncontrolled 07/07/2013   Essential hypertension 07/07/2013    PCP: Assunta Found, MD  REFERRING PROVIDER: Elinor Parkinson, [place]  REFERRING DIAG: [phone] (ICD-10-CM) - Tibialis anterior tendon tear, traumatic  M79.89 (ICD-10-CM) - Mass of soft tissue of foot Z98.890 (ICD-10-CM) - Status post left foot surgery  THERAPY DIAG:  Pain in left ankle and joints of left foot  Difficulty in walking, not elsewhere classified  Other abnormalities of gait and mobility  Muscle weakness (generalized)  Radiculopathy, lumbar region  Rationale for Evaluation and Treatment: Rehabilitation  ONSET DATE: 2022  SUBJECTIVE:   SUBJECTIVE STATEMENT: PT states that she did not get a HEP yet.  Pt states that she is worried about her balance more than anything else.  PERTINENT HISTORY: Hx of tibialis ant surgery 2022, hx of lumbar fusion 2021, neuropathy. PAIN:  Are you  having pain? Yes: NPRS scale: 1/10 Pain location: L calf, around the L ankle Pain description: aching Aggravating factors: wearing shoes  Relieving factors: barefoot/wearing flip flops, elevating the legs.  PRECAUTIONS: None  RED FLAGS: None   WEIGHT BEARING RESTRICTIONS: No  FALLS:  Has patient fallen in last 6 months? No  LIVING ENVIRONMENT: Lives with: lives with their spouse Lives in: House/apartment Stairs: Yes: External: 1 steps; none Has following equipment at home: Single point cane, Walker - 4 wheeled, and shower chair  OCCUPATION: retired  PLOF: Independent and Independent with basic ADLs  PATIENT GOALS: "I like to be able to move this ankle better"  NEXT MD VISIT: when PT is done  OBJECTIVE:  Note: Objective measures were completed at Evaluation unless otherwise noted.  DIAGNOSTIC FINDINGS:  01/16/23 MRI OF THE LEFT ANKLE WITHOUT CONTRAST   TECHNIQUE: Multiplanar, multisequence MR imaging of the ankle was performed. No intravenous contrast was administered.   COMPARISON:  Left ankle and foot radiographs 09/04/2020, MRI left ankle 11/16/2020   FINDINGS: TENDONS   Peroneal: The peroneus longus and brevis tendons are intact.   Posteromedial: Intact tibialis posterior, flexor digitorum longus, and flexor hallucis longus tendons.   Anterior: The tibialis anterior, extensor hallucis longus, and extensor digitorum longus tendons are intact.   Achilles: Intact.   Plantar Fascia: Mild-to-moderate plantar calcaneal heel spur is similar to prior. No plantar fasciitis.   LIGAMENTS   Lateral: The anterior and posterior talofibular, anterior and posterior tibiofibular, and calcaneofibular ligaments are intact.   Medial: The tibiotalar deep deltoid and tibial spring ligaments are intact.   CARTILAGE   Ankle Joint: There is focal at least partial-thickness cartilage loss within the far anteromedial aspect of the tibial find measuring only 2 mm in  transverse dimension and 3 mm in AP dimension (coronal series 108, image 20 and sagittal series 107, image 13).   Subtalar Joints/Sinus Tarsi: The subtalar cartilage is intact.Fat is preserved within the sinus tarsi.   Bones: No acute fracture.   Other: The tarsal tunnel is unremarkable. The Lisfranc ligament complex is intact.   IMPRESSION: 1. Intact tibialis anterior tendon. 2. Mild focal at least partial-thickness cartilage loss within the far anteromedial aspect of the tibial plafond measuring only 2 mm in transverse dimension and 3 mm in AP dimension. 3. Mild-to-moderate plantar calcaneal heel spur is similar to prior. No plantar fasciitis.  PATIENT SURVEYS:  ABC scale 860/1600 = 53.8%  COGNITION: Overall cognitive status: Within functional limits for tasks assessed     SENSATION: Light touch: Impaired  on the L   MUSCLE LENGTH: Gastrocsoleus: moderate restriction L, mild on the R   POSTURE:  standing: slight pronation on the L  PALPATION: No tenderness on major bony landmarks and tendons around the ankles Hypomobile towards post glide on the L talocrural joint  LOWER  EXTREMITY ROM:  Active ROM Right eval Left eval  Hip flexion [facility]  Hip extension [facility]  Hip abduction [facility]  Hip adduction    Hip internal rotation    Hip external rotation    Knee flexion [facility] WFL  Knee extension [facility] WFL  Ankle dorsiflexion 20-0 40-22  Ankle plantarflexion 20-40 40-50  Ankle inversion 0-40 0-20  Ankle eversion 0-25 0-12   (Blank rows = not tested)  LOWER EXTREMITY MMT:  MMT Right eval Left eval  Hip flexion 3+ 3+  Hip extension 3- 4  Hip abduction 4- 4  Hip adduction    Hip internal rotation    Hip external rotation    Knee flexion 4 4  Knee extension 4 4  Ankle dorsiflexion 4 2-  Ankle plantarflexion 4 4  Ankle inversion 4 3+  Ankle eversion 4 3+   (Blank rows = not tested)   FUNCTIONAL TESTS:  5 times sit to stand: 8.43 sec 2 minute walk test:  330 ft Dynamic Gait Index: 10  DGI 1. Gait level surface (1) Moderate Impairment: Walks 20', slow speed, abnormal gait pattern, evidence for imbalance. 2. Change in gait speed (1) Moderate Impairment: Makes only minor adjustments to walking speed, or accomplishes a change in speed with significant gait deviations, or changes speed but has significant gait deviations, or changes speed but loses balance but is able to recover and continue walking. 3. Gait with horizontal head turns (1) Moderate Impairment: Performs head turns with moderate change in gait velocity, slows down, staggers but recovers, can continue to walk. 4. Gait with vertical head turns 1) Moderate Impairment: Performs head turns with moderate change in gait velocity, slows down, staggers but recovers, can continue to walk. 5. Gait and pivot turn (1) Moderate Impairment: Turns slowly, requires verbal cueing, requires several small steps to catch balance following turn and stop. 6. Step over obstacle (1) Moderate Impairment: Is able to step over box but must stop, then step over. May require verbal cueing. 7. Step around obstacles (2) Mild Impairment: Is able to step around both cones, but must slow down and adjust steps to clear cones. 8. Stairs (2) Mild Impairment: Alternating feet, must use rail.  TOTAL SCORE: 10 / 24  GAIT: Distance walked: 330 ft Assistive device utilized: None Level of assistance: CGA Comments: done during , L knee flexed on midstance, L foot slap, trunk lat lean on the L, pelvic drop on L (Tredelenburg)                                                                                                                           TREATMENT DATE:  05/29/23 Sitting : Ankle dorsiflexion/plantarflexion x 10 hold x 5" Sit to stand x 10 PROM for LT DF Standing Toe raises   Gastroc stretch 30" x 3  Theraband RED: for Dorisflexion, inversion, eversion and plantarflexion.  05/21/23 Evaluation and  patient education done    PATIENT EDUCATION:  Education details: Educated on the goals and course of rehab. Educated on measures to prevent falls at home. Person educated: Patient Education method: Explanation Education comprehension: verbalized understanding  HOME EXERCISE PROGRAM: Access Code: [email or web address] URL: [email or web address] Date: 05/29/2023 Prepared by: Virgina Organ  Exercises - Seated Toe Raise  - 3 x daily - 7 x weekly - 1 sets - 10 reps - 5 seconds  hold - Long Sitting Ankle Dorsiflexion with Anchored Resistance  - 3 x daily - 7 x weekly - 1 sets - 10 reps - 5 hold - Long Sitting Ankle Eversion with Resistance  - 3 x daily - 7 x weekly - 1 sets - 10 reps - 5 hold - Long Sitting Ankle Inversion with Anchored Resistance  - 3 x daily - 7 x weekly - 1 sets - 10 reps - 5 hold - Standing Ankle Dorsiflexion Stretch  - 3 x daily - 7 x weekly - 1 sets - 3 reps - 30" hold - Toe Raises with Counter Support  - 3 x daily - 7 x weekly - 1 sets - 10 reps - 3-5 seconds  hold - Sit to Stand  - 3 x daily - 7 x weekly - 1 sets - 10 reps ASSESSMENT:  CLINICAL IMPRESSION: Evaluation and goals reviewed with pt.  Began patient on a HEP.  Therapist recommended that pt get a dorsiflexion assist that hooks onto her ankle and shoe, she has an AFO but does not wear it due to it being uncomfortable.  . Patient's continues to have difficulty with walking due to pain, weakness, impaired proprioception, and decreased soft tissue extensibility. Skilled PT is required to address the impairments and functional limitations listed below.   OBJECTIVE IMPAIRMENTS: Abnormal gait, decreased activity tolerance, decreased balance, decreased mobility, difficulty walking, decreased ROM, decreased strength, hypomobility, impaired flexibility, and pain.   ACTIVITY LIMITATIONS: carrying, lifting, bending, standing, squatting, and stairs  PARTICIPATION LIMITATIONS: meal prep, cleaning, laundry, shopping,  community activity, and yard work  PERSONAL FACTORS: Age, Time since onset of injury/illness/exacerbation, and 1-2 comorbidities: hx of lumbar fusion 2021, neuropathy  are also affecting patient's functional outcome.   REHAB POTENTIAL: Fair    CLINICAL DECISION MAKING: Evolving/moderate complexity  EVALUATION COMPLEXITY: Moderate   GOALS: Goals reviewed with patient? Yes  SHORT TERM GOALS: Target date: 06/04/23 Pt will demonstrate indep in HEP to facilitate carry-over of skilled services and improve functional outcomes Goal status:in progress   LONG TERM GOALS: Target date: 06/18/23  Pt will improve ABC scale score by 10% in order to demonstrate improved confidence with balance and safety during ambulation and other ADLs Baseline: 53.8% Goal status: in progress  2.  Pt will increase by at least 40 ft in order to demonstrate clinically significant improvement in community ambulation Baseline: 330 ft Goal status: in progress  3.  Pt will improve DGI by at least 3 points in order to demonstrate clinically significant improvement in balance and decreased risk for falls Baseline: 10 Goal status: in progress  4.  Patient will demonstrate increase in ankle DF ROM by 10 deg to facilitate ease in ambulation  Baseline: see above Goal status: in progress  5.  Pt will demonstrate increase in ankle DF strength 3 to 3+/5 to facilitate ease and safety in ambulation  Baseline: 2-/5 Goal status: in progress   PLAN:  PT FREQUENCY: 2x/week  PT DURATION: 4 weeks  PLANNED INTERVENTIONS: 97164- PT Re-evaluation, 97110-Therapeutic exercises, 97530- Therapeutic activity, [id number]- Neuromuscular re-education, 97535- Self  Care, 52841- Manual therapy, [phone]- Gait training, [phone]- Electrical stimulation (unattended), Patient/Family education, and Stair training  PLAN FOR NEXT SESSION: Begin balance activity and give balance activities in the corner for HEP  Begin LE strengthening and mobility  activities.  Virgina Organ, PT CLT [phone]   05/29/2023, 3:50 PM

## 2023-06-03 ENCOUNTER — Encounter (HOSPITAL_COMMUNITY): Payer: Medicare PPO

## 2023-06-05 ENCOUNTER — Encounter (HOSPITAL_COMMUNITY): Payer: Medicare PPO

## 2023-06-09 ENCOUNTER — Ambulatory Visit (HOSPITAL_COMMUNITY): Payer: Medicare PPO | Attending: Podiatry

## 2023-06-09 DIAGNOSIS — R262 Difficulty in walking, not elsewhere classified: Secondary | ICD-10-CM | POA: Insufficient documentation

## 2023-06-09 DIAGNOSIS — R2689 Other abnormalities of gait and mobility: Secondary | ICD-10-CM | POA: Diagnosis not present

## 2023-06-09 DIAGNOSIS — M25572 Pain in left ankle and joints of left foot: Secondary | ICD-10-CM | POA: Diagnosis not present

## 2023-06-09 DIAGNOSIS — M6281 Muscle weakness (generalized): Secondary | ICD-10-CM | POA: Insufficient documentation

## 2023-06-09 NOTE — Therapy (Signed)
 OUTPATIENT PHYSICAL THERAPY LOWER EXTREMITY TREATMENT   Patient Name: Mindy Ellis MRN: [id number] DOB:1950/09/10, 73 y.o., female Today's Date: 06/09/2023  END OF SESSION:  PT End of Session - 06/09/23 1304     Visit Number 3    Number of Visits 8    Date for PT Re-Evaluation 06/18/23    Authorization Type [employer] medicare 8 visits approved from 2/13 thru 06/20/23    Authorization - Visit Number 2    Authorization - Number of Visits 8    Progress Note Due on Visit 9    PT Start Time 1300    PT Stop Time 1340    PT Time Calculation (min) 40 min    Equipment Utilized During Treatment Gait belt    Activity Tolerance Patient tolerated treatment well    Behavior During Therapy WFL for tasks assessed/performed               Past Medical History:  Diagnosis Date   Abdominal pain    Acid reflux    Chronic diarrhea    DDD (degenerative disc disease)    Depression    Diabetes mellitus without complication (HCC)    diet controlled   Dysphagia    Fibromyalgia    GERD (gastroesophageal reflux disease)    Hypertension    Meningioma (HCC)    PONV (postoperative nausea and vomiting)    nausea, pt must not vomit due to reflux surgery   Past Surgical History:  Procedure Laterality Date   ABDOMINAL HYSTERECTOMY  age 41   ANTERIOR AND POSTERIOR REPAIR N/A 01/18/2013   Procedure: ANTERIOR (CYSTOCELE) ;  Surgeon: Martina Sinner, MD;  Location: [facility];  Service: Urology;  Laterality: N/A;   APPENDECTOMY  age 61   both ovaries removed   BREAST SURGERY Left 21 yrs ago   benign lump removed   CHOLECYSTECTOMY     COLONOSCOPY  11/11/07   NUR   CYSTOSCOPY N/A 01/18/2013   Procedure: CYSTOSCOPY;  Surgeon: Martina Sinner, MD;  Location: [facility];  Service: Urology;  Laterality: N/A;   EYE SURGERY     lasix eye surgery Bilateral    PROCTOSCOPY  01/18/2013   Procedure: PROCTOSCOPY;  Surgeon: Martina Sinner, MD;  Location: [facility];  Service: Urology;;   surgery  for acid  reflux  1999   TONSILLECTOMY     TRANSFORAMINAL LUMBAR INTERBODY FUSION (TLIF) WITH PEDICLE SCREW FIXATION 1 LEVEL Left 04/04/2020   Procedure: Left Lumbar four-five Transforaminal lumbar interbody fusion;  Surgeon: Maeola Harman, MD;  Location: [facility];  Service: Neurosurgery;  Laterality: Left;   UPPER GASTROINTESTINAL ENDOSCOPY  2006   Patient Active Problem List   Diagnosis Date Noted   Left foot drop 09/30/2022   Primary osteoarthritis of right hip 07/16/2020   Acute right hip pain 06/27/2020   Elevated blood-pressure reading, without diagnosis of hypertension 04/25/2020   Spondylolisthesis of lumbar region 04/04/2020   Chronic bilateral low back pain with left-sided sciatica 03/05/2020   Herniated nucleus pulposus, lumbar 03/05/2020   Other chronic pain 03/05/2020   Benign meningioma (HCC) 02/14/2019   Body mass index (BMI) 29.0-29.9, adult 02/14/2019   Cerebral meningioma (HCC) 08/10/2013   Hereditary and idiopathic peripheral neuropathy 07/07/2013   Type II or unspecified type diabetes mellitus without mention of complication, not stated as uncontrolled 07/07/2013   Essential hypertension 07/07/2013    PCP: Assunta Found, MD  REFERRING PROVIDER: Elinor Parkinson, [place]  REFERRING DIAG: [id number] (ICD-10-CM) - Tibialis anterior  tendon tear, traumatic M79.89 (ICD-10-CM) - Mass of soft tissue of foot Z98.890 (ICD-10-CM) - Status post left foot surgery  THERAPY DIAG:  Pain in left ankle and joints of left foot  Difficulty in walking, not elsewhere classified  Other abnormalities of gait and mobility  Muscle weakness (generalized)  Rationale for Evaluation and Treatment: Rehabilitation  ONSET DATE: 2022  SUBJECTIVE:   SUBJECTIVE STATEMENT: Doing well today. Denies recent falls. However, L calf hurts = 2/10.  PERTINENT HISTORY: Hx of tibialis ant surgery 2022, hx of lumbar fusion 2021, neuropathy. PAIN:  Are you having pain? Yes: NPRS scale: 1/10 Pain location: L calf,  around the L ankle Pain description: aching Aggravating factors: wearing shoes  Relieving factors: barefoot/wearing flip flops, elevating the legs.  PRECAUTIONS: None  RED FLAGS: None   WEIGHT BEARING RESTRICTIONS: No  FALLS:  Has patient fallen in last 6 months? No  LIVING ENVIRONMENT: Lives with: lives with their spouse Lives in: House/apartment Stairs: Yes: External: 1 steps; none Has following equipment at home: Single point cane, Walker - 4 wheeled, and shower chair  OCCUPATION: retired  PLOF: Independent and Independent with basic ADLs  PATIENT GOALS: "I like to be able to move this ankle better"  NEXT MD VISIT: when PT is done  OBJECTIVE:  Note: Objective measures were completed at Evaluation unless otherwise noted.  DIAGNOSTIC FINDINGS:  01/16/23 MRI OF THE LEFT ANKLE WITHOUT CONTRAST   TECHNIQUE: Multiplanar, multisequence MR imaging of the ankle was performed. No intravenous contrast was administered.   COMPARISON:  Left ankle and foot radiographs 09/04/2020, MRI left ankle 11/16/2020   FINDINGS: TENDONS   Peroneal: The peroneus longus and brevis tendons are intact.   Posteromedial: Intact tibialis posterior, flexor digitorum longus, and flexor hallucis longus tendons.   Anterior: The tibialis anterior, extensor hallucis longus, and extensor digitorum longus tendons are intact.   Achilles: Intact.   Plantar Fascia: Mild-to-moderate plantar calcaneal heel spur is similar to prior. No plantar fasciitis.   LIGAMENTS   Lateral: The anterior and posterior talofibular, anterior and posterior tibiofibular, and calcaneofibular ligaments are intact.   Medial: The tibiotalar deep deltoid and tibial spring ligaments are intact.   CARTILAGE   Ankle Joint: There is focal at least partial-thickness cartilage loss within the far anteromedial aspect of the tibial find measuring only 2 mm in transverse dimension and 3 mm in AP dimension (coronal series  108, image 20 and sagittal series 107, image 13).   Subtalar Joints/Sinus Tarsi: The subtalar cartilage is intact.Fat is preserved within the sinus tarsi.   Bones: No acute fracture.   Other: The tarsal tunnel is unremarkable. The Lisfranc ligament complex is intact.   IMPRESSION: 1. Intact tibialis anterior tendon. 2. Mild focal at least partial-thickness cartilage loss within the far anteromedial aspect of the tibial plafond measuring only 2 mm in transverse dimension and 3 mm in AP dimension. 3. Mild-to-moderate plantar calcaneal heel spur is similar to prior. No plantar fasciitis.  PATIENT SURVEYS:  ABC scale 860/1600 = 53.8%  COGNITION: Overall cognitive status: Within functional limits for tasks assessed     SENSATION: Light touch: Impaired  on the L   MUSCLE LENGTH: Gastrocsoleus: moderate restriction L, mild on the R   POSTURE:  standing: slight pronation on the L  PALPATION: No tenderness on major bony landmarks and tendons around the ankles Hypomobile towards post glide on the L talocrural joint  LOWER EXTREMITY ROM:  Active ROM Right eval Left eval  Hip flexion [facility]  WFL  Hip extension [facility]  Hip abduction [facility]  Hip adduction    Hip internal rotation    Hip external rotation    Knee flexion [facility] WFL  Knee extension [facility] WFL  Ankle dorsiflexion 20-0 40-22  Ankle plantarflexion 20-40 40-50  Ankle inversion 0-40 0-20  Ankle eversion 0-25 0-12   (Blank rows = not tested)  LOWER EXTREMITY MMT:  MMT Right eval Left eval  Hip flexion 3+ 3+  Hip extension 3- 4  Hip abduction 4- 4  Hip adduction    Hip internal rotation    Hip external rotation    Knee flexion 4 4  Knee extension 4 4  Ankle dorsiflexion 4 2-  Ankle plantarflexion 4 4  Ankle inversion 4 3+  Ankle eversion 4 3+   (Blank rows = not tested)   FUNCTIONAL TESTS:  5 times sit to stand: 8.43 sec 2 minute walk test: 330 ft Dynamic Gait Index: 10  DGI 1. Gait level  surface (1) Moderate Impairment: Walks 20', slow speed, abnormal gait pattern, evidence for imbalance. 2. Change in gait speed (1) Moderate Impairment: Makes only minor adjustments to walking speed, or accomplishes a change in speed with significant gait deviations, or changes speed but has significant gait deviations, or changes speed but loses balance but is able to recover and continue walking. 3. Gait with horizontal head turns (1) Moderate Impairment: Performs head turns with moderate change in gait velocity, slows down, staggers but recovers, can continue to walk. 4. Gait with vertical head turns 1) Moderate Impairment: Performs head turns with moderate change in gait velocity, slows down, staggers but recovers, can continue to walk. 5. Gait and pivot turn (1) Moderate Impairment: Turns slowly, requires verbal cueing, requires several small steps to catch balance following turn and stop. 6. Step over obstacle (1) Moderate Impairment: Is able to step over box but must stop, then step over. May require verbal cueing. 7. Step around obstacles (2) Mild Impairment: Is able to step around both cones, but must slow down and adjust steps to clear cones. 8. Stairs (2) Mild Impairment: Alternating feet, must use rail.  TOTAL SCORE: 10 / 24  GAIT: Distance walked: 330 ft Assistive device utilized: None Level of assistance: CGA Comments: done during , L knee flexed on midstance, L foot slap, trunk lat lean on the L, pelvic drop on L (Tredelenburg)                                                                                                                           TREATMENT DATE:  06/09/23 NuStep, seat 8, level 1, 5' Gr 2 sustained post talocrural glide x 30" x 3 on each foot L Ankle alphabets x 1 set Gastrocnemius slant board x 30" x 3 Seated L tibialis ant stretch x 30" x 3 Standing heel raises x 10 x 2 Seated L toe raises x 1 lbs x 10 x 2  05/29/23 Sitting :  Ankle  dorsiflexion/plantarflexion x 10 hold x 5" Sit to stand x 10 PROM for LT DF Standing Toe raises   Gastroc stretch 30" x 3  Theraband RED: for Dorisflexion, inversion, eversion and plantarflexion.  05/21/23 Evaluation and patient education done    PATIENT EDUCATION:  Education details: Educated on the goals and course of rehab. Educated on measures to prevent falls at home. Person educated: Patient Education method: Explanation Education comprehension: verbalized understanding  HOME EXERCISE PROGRAM: Access Code: [email or web address] URL: [email or web address] 06/09/2023 - Seated Ankle Alphabet  - 2 x daily - 7 x weekly - 1 sets - Seated Anterior Tibialis Stretch  - 2 x daily - 7 x weekly - 3 reps - 30 hold - Heel Raises with Counter Support  - 2 x daily - 7 x weekly - 3 sets - 10 reps  Date: 05/29/2023 Prepared by: Virgina Organ  Exercises - Seated Toe Raise  - 3 x daily - 7 x weekly - 1 sets - 10 reps - 5 seconds  hold - Long Sitting Ankle Dorsiflexion with Anchored Resistance  - 3 x daily - 7 x weekly - 1 sets - 10 reps - 5 hold - Long Sitting Ankle Eversion with Resistance  - 3 x daily - 7 x weekly - 1 sets - 10 reps - 5 hold - Long Sitting Ankle Inversion with Anchored Resistance  - 3 x daily - 7 x weekly - 1 sets - 10 reps - 5 hold - Standing Ankle Dorsiflexion Stretch  - 3 x daily - 7 x weekly - 1 sets - 3 reps - 30" hold - Toe Raises with Counter Support  - 3 x daily - 7 x weekly - 1 sets - 10 reps - 3-5 seconds  hold - Sit to Stand  - 3 x daily - 7 x weekly - 1 sets - 10 reps ASSESSMENT:  CLINICAL IMPRESSION: Interventions today were geared towards ankle strengthening and mobility. Tolerated all activities without worsening of symptoms. Demonstrated appropriate levels of fatigue. Provided mod amount of cueing to ensure correct execution of activity with poor to fair carry-over especially on ankle DF due to weakness. Attempted DF with YTB but patient was unable to it  was changed to 1 lb weight which patient was able to tolerate better. To date, skilled PT is required to address the impairments and improve function. .   OBJECTIVE IMPAIRMENTS: Abnormal gait, decreased activity tolerance, decreased balance, decreased mobility, difficulty walking, decreased ROM, decreased strength, hypomobility, impaired flexibility, and pain.   ACTIVITY LIMITATIONS: carrying, lifting, bending, standing, squatting, and stairs  PARTICIPATION LIMITATIONS: meal prep, cleaning, laundry, shopping, community activity, and yard work  PERSONAL FACTORS: Age, Time since onset of injury/illness/exacerbation, and 1-2 comorbidities: hx of lumbar fusion 2021, neuropathy  are also affecting patient's functional outcome.   REHAB POTENTIAL: Fair    CLINICAL DECISION MAKING: Evolving/moderate complexity  EVALUATION COMPLEXITY: Moderate   GOALS: Goals reviewed with patient? Yes  SHORT TERM GOALS: Target date: 06/04/23 Pt will demonstrate indep in HEP to facilitate carry-over of skilled services and improve functional outcomes Goal status:in progress   LONG TERM GOALS: Target date: 06/18/23  Pt will improve ABC scale score by 10% in order to demonstrate improved confidence with balance and safety during ambulation and other ADLs Baseline: 53.8% Goal status: in progress  2.  Pt will increase by at least 40 ft in order to demonstrate clinically significant improvement in community ambulation Baseline: 330 ft Goal status: in progress  3.  Pt will improve DGI by at least 3 points in order to demonstrate clinically significant improvement in balance and decreased risk for falls Baseline: 10 Goal status: in progress  4.  Patient will demonstrate increase in ankle DF ROM by 10 deg to facilitate ease in ambulation  Baseline: see above Goal status: in progress  5.  Pt will demonstrate increase in ankle DF strength 3 to 3+/5 to facilitate ease and safety in ambulation  Baseline:  2-/5 Goal status: in progress   PLAN:  PT FREQUENCY: 2x/week  PT DURATION: 4 weeks  PLANNED INTERVENTIONS: 97164- PT Re-evaluation, 97110-Therapeutic exercises, 97530- Therapeutic activity, 97112- Neuromuscular re-education, 97535- Self Care, 16109- Manual therapy, 97116- Gait training, 97014- Electrical stimulation (unattended), Patient/Family education, and Stair training  PLAN FOR NEXT SESSION: Continue POC and may progress as tolerated with emphasis on balance activity, LE strengthening and mobility activities.  Tish Frederickson. Orean Giarratano, PT, DPT, OCS Board-Certified Clinical Specialist in Orthopedic PT PT Compact Privilege # ([place]): [id number] T 06/09/2023, 1:05 PM

## 2023-06-12 ENCOUNTER — Ambulatory Visit (HOSPITAL_COMMUNITY): Payer: Medicare PPO

## 2023-06-12 DIAGNOSIS — R2689 Other abnormalities of gait and mobility: Secondary | ICD-10-CM

## 2023-06-12 DIAGNOSIS — R262 Difficulty in walking, not elsewhere classified: Secondary | ICD-10-CM | POA: Diagnosis not present

## 2023-06-12 DIAGNOSIS — M25572 Pain in left ankle and joints of left foot: Secondary | ICD-10-CM | POA: Diagnosis not present

## 2023-06-12 DIAGNOSIS — M6281 Muscle weakness (generalized): Secondary | ICD-10-CM

## 2023-06-12 NOTE — Therapy (Addendum)
 OUTPATIENT PHYSICAL THERAPY LOWER EXTREMITY TREATMENT   Patient Name: Mindy Ellis MRN: [id number] DOB:1950-11-10, 73 y.o., female Today's Date: 06/12/2023  END OF SESSION:  PT End of Session - 06/12/23 1304     Visit Number 4    Number of Visits 8    Date for PT Re-Evaluation 06/18/23    Authorization Type [employer] medicare 8 visits approved from 2/13 thru 06/20/23    Authorization - Visit Number 3    Authorization - Number of Visits 8    Progress Note Due on Visit 9    PT Start Time 1300    PT Stop Time 1340    PT Time Calculation (min) 40 min    Equipment Utilized During Treatment Gait belt    Activity Tolerance Patient tolerated treatment well    Behavior During Therapy WFL for tasks assessed/performed                Past Medical History:  Diagnosis Date   Abdominal pain    Acid reflux    Chronic diarrhea    DDD (degenerative disc disease)    Depression    Diabetes mellitus without complication (HCC)    diet controlled   Dysphagia    Fibromyalgia    GERD (gastroesophageal reflux disease)    Hypertension    Meningioma (HCC)    PONV (postoperative nausea and vomiting)    nausea, pt must not vomit due to reflux surgery   Past Surgical History:  Procedure Laterality Date   ABDOMINAL HYSTERECTOMY  age 48   ANTERIOR AND POSTERIOR REPAIR N/A 01/18/2013   Procedure: ANTERIOR (CYSTOCELE) ;  Surgeon: Martina Sinner, MD;  Location: [facility];  Service: Urology;  Laterality: N/A;   APPENDECTOMY  age 6   both ovaries removed   BREAST SURGERY Left 21 yrs ago   benign lump removed   CHOLECYSTECTOMY     COLONOSCOPY  11/11/07   NUR   CYSTOSCOPY N/A 01/18/2013   Procedure: CYSTOSCOPY;  Surgeon: Martina Sinner, MD;  Location: [facility];  Service: Urology;  Laterality: N/A;   EYE SURGERY     lasix eye surgery Bilateral    PROCTOSCOPY  01/18/2013   Procedure: PROCTOSCOPY;  Surgeon: Martina Sinner, MD;  Location: [facility];  Service: Urology;;   surgery  for acid  reflux  1999   TONSILLECTOMY     TRANSFORAMINAL LUMBAR INTERBODY FUSION (TLIF) WITH PEDICLE SCREW FIXATION 1 LEVEL Left 04/04/2020   Procedure: Left Lumbar four-five Transforaminal lumbar interbody fusion;  Surgeon: Maeola Harman, MD;  Location: [facility];  Service: Neurosurgery;  Laterality: Left;   UPPER GASTROINTESTINAL ENDOSCOPY  2006   Patient Active Problem List   Diagnosis Date Noted   Left foot drop 09/30/2022   Primary osteoarthritis of right hip 07/16/2020   Acute right hip pain 06/27/2020   Elevated blood-pressure reading, without diagnosis of hypertension 04/25/2020   Spondylolisthesis of lumbar region 04/04/2020   Chronic bilateral low back pain with left-sided sciatica 03/05/2020   Herniated nucleus pulposus, lumbar 03/05/2020   Other chronic pain 03/05/2020   Benign meningioma (HCC) 02/14/2019   Body mass index (BMI) 29.0-29.9, adult 02/14/2019   Cerebral meningioma (HCC) 08/10/2013   Hereditary and idiopathic peripheral neuropathy 07/07/2013   Type II or unspecified type diabetes mellitus without mention of complication, not stated as uncontrolled 07/07/2013   Essential hypertension 07/07/2013    PCP: Assunta Found, MD  REFERRING PROVIDER: Elinor Parkinson, [place]  REFERRING DIAG: [phone] (ICD-10-CM) - Tibialis  anterior tendon tear, traumatic M79.89 (ICD-10-CM) - Mass of soft tissue of foot Z98.890 (ICD-10-CM) - Status post left foot surgery  THERAPY DIAG:  Pain in left ankle and joints of left foot  Other abnormalities of gait and mobility  Muscle weakness (generalized)  Difficulty in walking, not elsewhere classified  Rationale for Evaluation and Treatment: Rehabilitation  ONSET DATE: 2022  SUBJECTIVE:   SUBJECTIVE STATEMENT: Doing well today. Denies recent falls. However, L hip hurts = 7/10 pain. Denies any recent episode of falls.  PERTINENT HISTORY: Hx of tibialis ant surgery 2022, hx of lumbar fusion 2021, neuropathy. PAIN:  Are you having pain? Yes:  NPRS scale: 1/10 Pain location: L calf, around the L ankle Pain description: aching Aggravating factors: wearing shoes  Relieving factors: barefoot/wearing flip flops, elevating the legs.  PRECAUTIONS: None  RED FLAGS: None   WEIGHT BEARING RESTRICTIONS: No  FALLS:  Has patient fallen in last 6 months? No  LIVING ENVIRONMENT: Lives with: lives with their spouse Lives in: House/apartment Stairs: Yes: External: 1 steps; none Has following equipment at home: Single point cane, Walker - 4 wheeled, and shower chair  OCCUPATION: retired  PLOF: Independent and Independent with basic ADLs  PATIENT GOALS: "I like to be able to move this ankle better"  NEXT MD VISIT: when PT is done  OBJECTIVE:  Note: Objective measures were completed at Evaluation unless otherwise noted.  DIAGNOSTIC FINDINGS:  01/16/23 MRI OF THE LEFT ANKLE WITHOUT CONTRAST   TECHNIQUE: Multiplanar, multisequence MR imaging of the ankle was performed. No intravenous contrast was administered.   COMPARISON:  Left ankle and foot radiographs 09/04/2020, MRI left ankle 11/16/2020   FINDINGS: TENDONS   Peroneal: The peroneus longus and brevis tendons are intact.   Posteromedial: Intact tibialis posterior, flexor digitorum longus, and flexor hallucis longus tendons.   Anterior: The tibialis anterior, extensor hallucis longus, and extensor digitorum longus tendons are intact.   Achilles: Intact.   Plantar Fascia: Mild-to-moderate plantar calcaneal heel spur is similar to prior. No plantar fasciitis.   LIGAMENTS   Lateral: The anterior and posterior talofibular, anterior and posterior tibiofibular, and calcaneofibular ligaments are intact.   Medial: The tibiotalar deep deltoid and tibial spring ligaments are intact.   CARTILAGE   Ankle Joint: There is focal at least partial-thickness cartilage loss within the far anteromedial aspect of the tibial find measuring only 2 mm in transverse dimension  and 3 mm in AP dimension (coronal series 108, image 20 and sagittal series 107, image 13).   Subtalar Joints/Sinus Tarsi: The subtalar cartilage is intact.Fat is preserved within the sinus tarsi.   Bones: No acute fracture.   Other: The tarsal tunnel is unremarkable. The Lisfranc ligament complex is intact.   IMPRESSION: 1. Intact tibialis anterior tendon. 2. Mild focal at least partial-thickness cartilage loss within the far anteromedial aspect of the tibial plafond measuring only 2 mm in transverse dimension and 3 mm in AP dimension. 3. Mild-to-moderate plantar calcaneal heel spur is similar to prior. No plantar fasciitis.  PATIENT SURVEYS:  ABC scale 860/1600 = 53.8%  COGNITION: Overall cognitive status: Within functional limits for tasks assessed     SENSATION: Light touch: Impaired  on the L   MUSCLE LENGTH: Gastrocsoleus: moderate restriction L, mild on the R   POSTURE:  standing: slight pronation on the L  PALPATION: No tenderness on major bony landmarks and tendons around the ankles Hypomobile towards post glide on the L talocrural joint  LOWER EXTREMITY ROM:  Active ROM  Right eval Left eval  Hip flexion [facility]  Hip extension [facility]  Hip abduction [facility]  Hip adduction    Hip internal rotation    Hip external rotation    Knee flexion [facility] WFL  Knee extension [facility] WFL  Ankle dorsiflexion 20-0 40-22  Ankle plantarflexion 20-40 40-50  Ankle inversion 0-40 0-20  Ankle eversion 0-25 0-12   (Blank rows = not tested)  LOWER EXTREMITY MMT:  MMT Right eval Left eval  Hip flexion 3+ 3+  Hip extension 3- 4  Hip abduction 4- 4  Hip adduction    Hip internal rotation    Hip external rotation    Knee flexion 4 4  Knee extension 4 4  Ankle dorsiflexion 4 2-  Ankle plantarflexion 4 4  Ankle inversion 4 3+  Ankle eversion 4 3+   (Blank rows = not tested)   FUNCTIONAL TESTS:  5 times sit to stand: 8.43 sec 2 minute walk test: 330 ft Dynamic Gait  Index: 10  DGI 1. Gait level surface (1) Moderate Impairment: Walks 20', slow speed, abnormal gait pattern, evidence for imbalance. 2. Change in gait speed (1) Moderate Impairment: Makes only minor adjustments to walking speed, or accomplishes a change in speed with significant gait deviations, or changes speed but has significant gait deviations, or changes speed but loses balance but is able to recover and continue walking. 3. Gait with horizontal head turns (1) Moderate Impairment: Performs head turns with moderate change in gait velocity, slows down, staggers but recovers, can continue to walk. 4. Gait with vertical head turns 1) Moderate Impairment: Performs head turns with moderate change in gait velocity, slows down, staggers but recovers, can continue to walk. 5. Gait and pivot turn (1) Moderate Impairment: Turns slowly, requires verbal cueing, requires several small steps to catch balance following turn and stop. 6. Step over obstacle (1) Moderate Impairment: Is able to step over box but must stop, then step over. May require verbal cueing. 7. Step around obstacles (2) Mild Impairment: Is able to step around both cones, but must slow down and adjust steps to clear cones. 8. Stairs (2) Mild Impairment: Alternating feet, must use rail.  TOTAL SCORE: 10 / 24  GAIT: Distance walked: 330 ft Assistive device utilized: None Level of assistance: CGA Comments: done during , L knee flexed on midstance, L foot slap, trunk lat lean on the L, pelvic drop on L (Tredelenburg)                                                                                                                           TREATMENT DATE:  06/12/23 NuStep, seat 8, level 2, 5' Gastrocnemius slant board x 30" x 3 Seated L tibialis ant stretch x 30" x 3 Standing heel raises x 10 x 2 Seated L toe raises x 1 lb x 3" x 10 x 2 Heel taps on small orange cones x 10 x 2 Hip vectors, RTB  x 10 x 2 Tandem stance x 30" x  2 Fall reaction training x 2' Stepping over 4" hurdles x 3 rounds inside // bars  06/09/23 NuStep, seat 8, level 1, 5' Gr 2 sustained post talocrural glide x 30" x 3 on each foot L Ankle alphabets x 1 set Gastrocnemius slant board x 30" x 3 Seated L tibialis ant stretch x 30" x 3 Standing heel raises x 10 x 2 Seated L toe raises x 1 lbs x 10 x 2  05/29/23 Sitting : Ankle dorsiflexion/plantarflexion x 10 hold x 5" Sit to stand x 10 PROM for LT DF Standing Toe raises   Gastroc stretch 30" x 3  Theraband RED: for Dorisflexion, inversion, eversion and plantarflexion.  05/21/23 Evaluation and patient education done    PATIENT EDUCATION:  Education details: Educated on the goals and course of rehab. Educated on measures to prevent falls at home. Person educated: Patient Education method: Explanation Education comprehension: verbalized understanding  HOME EXERCISE PROGRAM: Access Code: [email or web address] URL: [email or web address] 06/12/2023 - Standing 3-Way Leg Reach with Resistance at Ankles and Counter Support  - 1 x daily - 7 x weekly - 2 sets - 10 reps - Standing Tandem Balance with Counter Support  - 2 x daily - 7 x weekly - 2 reps - 30 hold  06/09/2023 - Seated Ankle Alphabet  - 2 x daily - 7 x weekly - 1 sets - Seated Anterior Tibialis Stretch  - 2 x daily - 7 x weekly - 3 reps - 30 hold - Heel Raises with Counter Support  - 2 x daily - 7 x weekly - 3 sets - 10 reps  Date: 05/29/2023 Prepared by: Virgina Organ  Exercises - Seated Toe Raise  - 3 x daily - 7 x weekly - 1 sets - 10 reps - 5 seconds  hold - Long Sitting Ankle Dorsiflexion with Anchored Resistance  - 3 x daily - 7 x weekly - 1 sets - 10 reps - 5 hold - Long Sitting Ankle Eversion with Resistance  - 3 x daily - 7 x weekly - 1 sets - 10 reps - 5 hold - Long Sitting Ankle Inversion with Anchored Resistance  - 3 x daily - 7 x weekly - 1 sets - 10 reps - 5 hold - Standing Ankle Dorsiflexion Stretch  - 3  x daily - 7 x weekly - 1 sets - 3 reps - 30" hold - Toe Raises with Counter Support  - 3 x daily - 7 x weekly - 1 sets - 10 reps - 3-5 seconds  hold - Sit to Stand  - 3 x daily - 7 x weekly - 1 sets - 10 reps ASSESSMENT:  CLINICAL IMPRESSION: Interventions today were geared towards ankle strengthening and mobility. Tolerated all activities without worsening of symptoms except when doing hip vectors where patient reported of L hip pain = 8/10. Demonstrated appropriate levels of fatigue. Provided mod amount of cueing to ensure correct execution of activity with poor to fair carry-over especially on ankle DF due to weakness. Mild to moderate unsteadiness in tandem stance due to impaired proprioception. To date, skilled PT is required to address the impairments and improve function. .   OBJECTIVE IMPAIRMENTS: Abnormal gait, decreased activity tolerance, decreased balance, decreased mobility, difficulty walking, decreased ROM, decreased strength, hypomobility, impaired flexibility, and pain.   ACTIVITY LIMITATIONS: carrying, lifting, bending, standing, squatting, and stairs  PARTICIPATION LIMITATIONS: meal prep, cleaning, laundry, shopping, community activity, and yard work  PERSONAL FACTORS: Age, Time since onset of injury/illness/exacerbation, and 1-2 comorbidities: hx of lumbar fusion 2021, neuropathy  are also affecting patient's functional outcome.   REHAB POTENTIAL: Fair    CLINICAL DECISION MAKING: Evolving/moderate complexity  EVALUATION COMPLEXITY: Moderate   GOALS: Goals reviewed with patient? Yes  SHORT TERM GOALS: Target date: 06/04/23 Pt will demonstrate indep in HEP to facilitate carry-over of skilled services and improve functional outcomes Goal status:in progress   LONG TERM GOALS: Target date: 06/18/23  Pt will improve ABC scale score by 10% in order to demonstrate improved confidence with balance and safety during ambulation and other ADLs Baseline: 53.8% Goal status: in  progress  2.  Pt will increase by at least 40 ft in order to demonstrate clinically significant improvement in community ambulation Baseline: 330 ft Goal status: in progress  3.  Pt will improve DGI by at least 3 points in order to demonstrate clinically significant improvement in balance and decreased risk for falls Baseline: 10 Goal status: in progress  4.  Patient will demonstrate increase in ankle DF ROM by 10 deg to facilitate ease in ambulation  Baseline: see above Goal status: in progress  5.  Pt will demonstrate increase in ankle DF strength 3 to 3+/5 to facilitate ease and safety in ambulation  Baseline: 2-/5 Goal status: in progress   PLAN:  PT FREQUENCY: 2x/week  PT DURATION: 4 weeks  PLANNED INTERVENTIONS: 97164- PT Re-evaluation, 97110-Therapeutic exercises, 97530- Therapeutic activity, 97112- Neuromuscular re-education, 97535- Self Care, 04540- Manual therapy, 97116- Gait training, 97014- Electrical stimulation (unattended), Patient/Family education, and Stair training  PLAN FOR NEXT SESSION: Continue POC and may progress as tolerated with emphasis on balance activity, LE strengthening and mobility activities.  Tish Frederickson. Ezreal Turay, PT, DPT, OCS Board-Certified Clinical Specialist in Orthopedic PT PT Compact Privilege # ([place]): [id number] T 06/12/2023, 1:05 PM

## 2023-06-16 DIAGNOSIS — D485 Neoplasm of uncertain behavior of skin: Secondary | ICD-10-CM | POA: Diagnosis not present

## 2023-06-16 DIAGNOSIS — L570 Actinic keratosis: Secondary | ICD-10-CM | POA: Diagnosis not present

## 2023-06-17 ENCOUNTER — Encounter (HOSPITAL_COMMUNITY): Payer: Medicare PPO | Admitting: Physical Therapy

## 2023-06-18 ENCOUNTER — Encounter: Payer: Self-pay | Admitting: Endocrinology

## 2023-06-18 ENCOUNTER — Ambulatory Visit (INDEPENDENT_AMBULATORY_CARE_PROVIDER_SITE_OTHER): Payer: Medicare PPO | Admitting: Endocrinology

## 2023-06-18 VITALS — BP 132/70 | HR 99 | Resp 20 | Ht 64.0 in | Wt 168.8 lb

## 2023-06-18 DIAGNOSIS — E119 Type 2 diabetes mellitus without complications: Secondary | ICD-10-CM | POA: Diagnosis not present

## 2023-06-18 DIAGNOSIS — E042 Nontoxic multinodular goiter: Secondary | ICD-10-CM | POA: Diagnosis not present

## 2023-06-18 DIAGNOSIS — Z7985 Long-term (current) use of injectable non-insulin antidiabetic drugs: Secondary | ICD-10-CM

## 2023-06-18 LAB — POCT GLYCOSYLATED HEMOGLOBIN (HGB A1C): Hemoglobin A1C: 5.7 % — AB (ref 4.0–5.6)

## 2023-06-18 MED ORDER — TIRZEPATIDE 7.5 MG/0.5ML ~~LOC~~ SOAJ
7.5000 mg | SUBCUTANEOUS | 3 refills | Status: DC
Start: 2023-06-18 — End: 2023-08-07

## 2023-06-18 NOTE — Progress Notes (Signed)
 Outpatient Endocrinology Note Mindy Dalayna Lauter, MD  06/18/23  Patient's Name: Mindy Ellis    DOB: 1951-03-23    MRN: [id number]                                                    REASON OF VISIT: Follow-up of type 2 diabetes mellitus  REFERRING PROVIDER:  Assunta Found, MD  PCP: Mindy Found, MD  HISTORY OF PRESENT ILLNESS:   Mindy Ellis is a 73 y.o. old female with past medical history listed below, is here for follow-up of type 2 diabetes mellitus.   Pertinent Diabetes History: Patient was diagnosed with type 2 diabetes mellitus around 2010. She takes oral and injectable steroid frequently for back pain/sciatic pain, worsening her diabetes control.  She has no personal history of pancreatitis, no family history of medullary thyroid cancer and MEM 2 syndrome.  Chronic Diabetes Complications : Retinopathy: no. Last ophthalmology exam was done on annually, reportedly. Nephropathy: CKD Peripheral neuropathy: yes ?, gabapentin. Coronary artery disease: no Stroke: no  Relevant comorbidities and cardiovascular risk factors: Obesity: yes Body mass index is 28.97 kg/m.  Hypertension: yes Hyperlipidemia. Yes, on a statin.  Current / Home Diabetic regimen includes: Mounjaro 5 mg weekly.  Mindy Ellis was started in July, 2024.  Prior diabetic medications: Metformin Januvia Glimepiride stopped in December 2024 due to improvement of diabetes control and hypoglycemia.  Glycemic data:   Accu-Chek guide glucometer data from February 27 2-13, 2025 reviewed average blood sugar 112 lowest blood sugar 102 and highest 131, she has been taking mostly daily in the morning fasting some of the blood sugar 104, 102, 117, 124.  Hypoglycemia: Patient has minor hypoglycemic episodes. Patient has hypoglycemia awareness.  Factors modifying glucose control: 1.  Diabetic diet assessment: 3 meals a day.  2.  Staying active or exercising: Limited physical activity, no formal exercise due to  sciatic pain and back pain.  3.  Medication compliance: compliant all of the time.  # Thyroid nodules: Ultrasound thyroid in January 2023 assistant with heterogeneous multinodular thyroid gland with bilateral thyroid nodules mostly spongiform and complex.  Right spongiform nodule measuring 2.2 cm, isthmus is spongiform nodule measuring 1.5 cm, left complex nodule measuring 2.2 cm and left spongiform nodule measuring 1.4 cm.  Stable compared to ultrasound in July 2021.  # Low TSH Outside lab records reviewed, TSH 0. 415 in 05/2022 (reference range0.450 - 4.500), mildly low TSH at that time, she had normal TSH in the past 0.462.  Patient denies complaints of palpitation, heat intolerance.  No change in bowel habit.  No weight loss.  No neck discomfort or dysphagia.  No redness or watering of the eyes.  Family history of thyroid disorder in mother.  Repeat thyroid function test in September 2024 on initial visit was normal as follows.   Latest Reference Range & Units 12/17/22 10:32  TSH 0.35 - 5.50 uIU/mL 0.78  Triiodothyronine,Free,Serum 2.3 - 4.2 pg/mL 3.5  T4,Free(Direct) 0.60 - 1.60 ng/dL 4.09    Interval history Glucometer data as reviewed above.  Hemoglobin A1c today 5.7%.  She has been tolerating Mounjaro well, denies any GI issues.  No other complaints today.   REVIEW OF SYSTEMS As per history of present illness.   PAST MEDICAL HISTORY: Past Medical History:  Diagnosis Date   Abdominal pain  Acid reflux    Chronic diarrhea    DDD (degenerative disc disease)    Depression    Diabetes mellitus without complication (HCC)    diet controlled   Dysphagia    Fibromyalgia    GERD (gastroesophageal reflux disease)    Hypertension    Meningioma (HCC)    PONV (postoperative nausea and vomiting)    nausea, pt must not vomit due to reflux surgery    PAST SURGICAL HISTORY: Past Surgical History:  Procedure Laterality Date   ABDOMINAL HYSTERECTOMY  age 69   ANTERIOR AND POSTERIOR  REPAIR N/A 01/18/2013   Procedure: ANTERIOR (CYSTOCELE) ;  Surgeon: Mindy Sinner, MD;  Location: [facility];  Service: Urology;  Laterality: N/A;   APPENDECTOMY  age 13   both ovaries removed   BREAST SURGERY Left 21 yrs ago   benign lump removed   CHOLECYSTECTOMY     COLONOSCOPY  11/11/07   NUR   CYSTOSCOPY N/A 01/18/2013   Procedure: CYSTOSCOPY;  Surgeon: Mindy Sinner, MD;  Location: [facility];  Service: Urology;  Laterality: N/A;   EYE SURGERY     lasix eye surgery Bilateral    PROCTOSCOPY  01/18/2013   Procedure: PROCTOSCOPY;  Surgeon: Mindy Sinner, MD;  Location: [facility];  Service: Urology;;   surgery  for acid reflux  1999   TONSILLECTOMY     TRANSFORAMINAL LUMBAR INTERBODY FUSION (TLIF) WITH PEDICLE SCREW FIXATION 1 LEVEL Left 04/04/2020   Procedure: Left Lumbar four-five Transforaminal lumbar interbody fusion;  Surgeon: Mindy Harman, MD;  Location: [facility];  Service: Neurosurgery;  Laterality: Left;   UPPER GASTROINTESTINAL ENDOSCOPY  2006    ALLERGIES: Allergies  Allergen Reactions   Latex    Mirtazapine Nausea Only   Naproxen Sodium     Upsets stomach   Rofecoxib    Statins Other (See Comments)    Muscle aches   Tape    Zolpidem Tartrate Other (See Comments)    Ambien= confusion   Elemental Sulfur Rash   Erythromycin Rash   Penicillins Rash   Sulfa Antibiotics Hives and Rash   Tetracyclines & Related Rash    FAMILY HISTORY:  Family History  Problem Relation Age of Onset   Drug abuse Other    Depression Mother    Asthma Mother    COPD Mother    Anxiety disorder Maternal Grandmother    Depression Maternal Grandmother    COPD Sister     SOCIAL HISTORY: Social History   Socioeconomic History   Marital status: Married    Spouse name: Mindy Ellis   Number of children: 1   Years of education: 12   Highest education level: 12th grade  Occupational History   Not on file  Tobacco Use   Smoking status: Never    Passive exposure: Never    Smokeless tobacco: Never  Vaping Use   Vaping status: Never Used  Substance and Sexual Activity   Alcohol use: Yes    Comment: occ wine   Drug use: No   Sexual activity: Yes    Partners: Male    Birth control/protection: Surgical  Other Topics Concern   Not on file  Social History Narrative   Not on file   Social Drivers of Health   Financial Resource Strain: Low Risk  (11/14/2021)   Overall Financial Resource Strain (CARDIA)    Difficulty of Paying Living Expenses: Not hard at all  Food Insecurity: No Food Insecurity (11/14/2021)   Hunger Vital Sign  Worried About Programme researcher, broadcasting/film/video in the Last Year: Never true    Ran Out of Food in the Last Year: Never true  Transportation Needs: No Transportation Needs (11/14/2021)   PRAPARE - Administrator, Civil Service (Medical): No    Lack of Transportation (Non-Medical): No  Physical Activity: Insufficiently Active (11/14/2021)   Exercise Vital Sign    Days of Exercise per Week: 3 days    Minutes of Exercise per Session: 30 min  Stress: No Stress Concern Present (11/14/2021)   [employer] of Occupational Health - Occupational Stress Questionnaire    Feeling of Stress : Only a little  Social Connections: Socially Integrated (11/14/2021)   Social Connection and Isolation Panel [NHANES]    Frequency of Communication with Friends and Family: More than three times a week    Frequency of Social Gatherings with Friends and Family: More than three times a week    Attends Religious Services: More than 4 times per year    Active Member of Clubs or Organizations: Yes    Attends Engineer, structural: More than 4 times per year    Marital Status: Married    MEDICATIONS:  Current Outpatient Medications  Medication Sig Dispense Refill   amitriptyline (ELAVIL) 50 MG tablet Take 50 mg by mouth at bedtime.     Blood Glucose Monitoring Suppl DEVI 1 each by Does not apply route in the morning, at noon, and at bedtime. May  substitute to any manufacturer covered by patient's insurance. 1 each 0   Calcium Carbonate-Vitamin D (CALCIUM + D PO) Take 600 mg by mouth daily.     cholecalciferol (VITAMIN D3) 25 MCG (1000 UNIT) tablet Take 2,000 Units by mouth daily.     diltiazem (CARDIZEM CD) 180 MG 24 hr capsule Take 180 mg by mouth daily.     gabapentin (NEURONTIN) 300 MG capsule Take 1 capsule (300 mg total) by mouth 3 (three) times daily. 90 capsule 3   Glucose Blood (BLOOD GLUCOSE TEST STRIPS) STRP 1 each by In Vitro route daily. May substitute to any manufacturer covered by patient's insurance. 100 each 3   MAGNESIUM PO Take by mouth.     rosuvastatin (CRESTOR) 5 MG tablet Take 5 mg by mouth daily.     tirzepatide (MOUNJARO) 7.5 MG/0.5ML Pen Inject 7.5 mg into the skin once a week. 6 mL 3   traZODone (DESYREL) 100 MG tablet Take 100 mg by mouth at bedtime.     No current facility-administered medications for this visit.    PHYSICAL EXAM: Vitals:   06/18/23 1307  BP: 132/70  Pulse: 99  Resp: 20  SpO2: 95%  Weight: 168 lb 12.8 oz (76.6 kg)  Height: 5\' 4"  (1.626 m)     Body mass index is 28.97 kg/m.  Wt Readings from Last 3 Encounters:  06/18/23 168 lb 12.8 oz (76.6 kg)  03/19/23 174 lb 6.4 oz (79.1 kg)  12/17/22 177 lb 12.8 oz (80.6 kg)    General: Well developed, well nourished female in no apparent distress.  HEENT: AT/[place], no external lesions.  Eyes: Conjunctiva clear and no icterus. Neck: Neck supple , no thyromegaly, no palpable thyroid nodule Lungs: Respirations not labored Neurologic: Alert, oriented, normal speech, DTR 2+ Extremities / Skin: Dry.   Psychiatric: Does not appear depressed or anxious  Diabetic Foot Exam - Simple   No data filed     LABS Reviewed Lab Results  Component Value Date   HGBA1C 5.7 (  A) 06/18/2023   HGBA1C 5.3 03/19/2023   HGBA1C 6.5 (A) 12/17/2022   No results Ellis for: "FRUCTOSAMINE" No results Ellis for: "CHOL", "HDL", "LDLCALC", "LDLDIRECT", "TRIG",  "CHOLHDL" Lab Results  Component Value Date   MICRALBCREAT 18 03/19/2023   Lab Results  Component Value Date   CREATININE 1.02 (H) 07/12/2022   No results Ellis for: "GFR"  ASSESSMENT / PLAN  1. Type 2 diabetes mellitus without complication, without long-term current use of insulin (HCC)   2. Multiple thyroid nodules      Diabetes Mellitus type 2, complicated by no known complications. - Diabetic status / severity: Controlled  Lab Results  Component Value Date   HGBA1C 5.7 (A) 06/18/2023    - Hemoglobin A1c goal : <7%  Diabetes control has improved hemoglobin A1c improved to 5.3%.  - Medications: See below  I) increase Mounjaro from 5 mg to 7.5 mg weekly.  To have weight loss benefit.  - Home glucose testing: In the morning fasting and occasionally other times of the day. - Discussed/ Gave Hypoglycemia treatment plan.  # Consult : not required at this time.   # Annual urine for microalbuminuria/ creatinine ratio, no microalbuminuria currently.. Last  Lab Results  Component Value Date   MICRALBCREAT 18 03/19/2023    # Foot check nightly. ?  Diabetic neuropathy, on gabapentin by primary care provider, ?  Podiatry.  # Annual dilated diabetic eye exams.   - Diet: Make healthy diabetic food choices - Life style / activity / exercise: Discussed.  2. Blood pressure  -  BP Readings from Last 1 Encounters:  06/18/23 132/70    - Control is in target.  - No change in current plans.  3. Lipid status / Hyperlipidemia - Last No results Ellis for: "LDLCALC" LDL 111. - Continue Crestor 5 mg daily.  Managed by primary care provider.  # Multiple thyroid nodules -She has bilateral multiple thyroid nodules, ultrasound thyroid in July 2021 and January 2023 showed mostly spongiform and complex thyroid nodule.  No worrisome features.  No indication of needle biopsy at this time.  These nodules needs monitoring with serial ultrasound.    Diagnoses and all orders for this  visit:  Type 2 diabetes mellitus without complication, without long-term current use of insulin (HCC) -     POCT glycosylated hemoglobin (Hb A1C) -     tirzepatide (MOUNJARO) 7.5 MG/0.5ML Pen; Inject 7.5 mg into the skin once a week.  Multiple thyroid nodules    DISPOSITION Follow up in clinic in 4 months suggested.   All questions answered and patient verbalized understanding of the plan.  Mindy Carmine Youngberg, MD [facility] [address] [place], [address] [place], [place] [address] Phone # [phone]  At least part of this note was generated using voice recognition software. Inadvertent word errors may have occurred, which were not recognized during the proofreading process.

## 2023-06-19 ENCOUNTER — Ambulatory Visit (HOSPITAL_COMMUNITY): Payer: Medicare PPO | Admitting: Physical Therapy

## 2023-06-19 DIAGNOSIS — R262 Difficulty in walking, not elsewhere classified: Secondary | ICD-10-CM

## 2023-06-19 DIAGNOSIS — M25572 Pain in left ankle and joints of left foot: Secondary | ICD-10-CM

## 2023-06-19 DIAGNOSIS — R2689 Other abnormalities of gait and mobility: Secondary | ICD-10-CM

## 2023-06-19 DIAGNOSIS — M6281 Muscle weakness (generalized): Secondary | ICD-10-CM | POA: Diagnosis not present

## 2023-06-19 NOTE — Therapy (Signed)
 OUTPATIENT PHYSICAL THERAPY LOWER EXTREMITY TREATMENT/DISCHARGE   Patient Name: Mindy Ellis MRN: [id number] DOB:April 23, 1950, 73 y.o., female Today's Date: 06/19/2023 PHYSICAL THERAPY DISCHARGE SUMMARY  Visits from Start of Care: 5  Current functional level related to goals / functional outcomes: GOALS HAVE NOT BEEN MET.  Pt reassessed due to insurance only covering thru 3/15. Pt has made progress in all aspects but has not met goals.    Remaining deficits: No goals met pt would like to try and work on HEP and see how she does on her own.    Education / Equipment: HEP   Patient agrees to discharge. Patient goals were partially met. Patient is being discharged due to the patient's request.  END OF SESSION:  PT End of Session - 06/19/23 1633     Visit Number 5    Number of Visits 5    Date for PT Re-Evaluation 06/18/23    Authorization Type [employer] medicare 8 visits approved from 2/13 thru 06/20/23    Authorization - Visit Number 4    Authorization - Number of Visits 8    Progress Note Due on Visit 4    PT Start Time 1550    PT Stop Time 1630    PT Time Calculation (min) 40 min    Activity Tolerance Patient tolerated treatment well    Behavior During Therapy WFL for tasks assessed/performed                 Past Medical History:  Diagnosis Date   Abdominal pain    Acid reflux    Chronic diarrhea    DDD (degenerative disc disease)    Depression    Diabetes mellitus without complication (HCC)    diet controlled   Dysphagia    Fibromyalgia    GERD (gastroesophageal reflux disease)    Hypertension    Meningioma (HCC)    PONV (postoperative nausea and vomiting)    nausea, pt must not vomit due to reflux surgery   Past Surgical History:  Procedure Laterality Date   ABDOMINAL HYSTERECTOMY  age 62   ANTERIOR AND POSTERIOR REPAIR N/A 01/18/2013   Procedure: ANTERIOR (CYSTOCELE) ;  Surgeon: Martina Sinner, MD;  Location: [facility];  Service: Urology;  Laterality:  N/A;   APPENDECTOMY  age 20   both ovaries removed   BREAST SURGERY Left 21 yrs ago   benign lump removed   CHOLECYSTECTOMY     COLONOSCOPY  11/11/07   NUR   CYSTOSCOPY N/A 01/18/2013   Procedure: CYSTOSCOPY;  Surgeon: Martina Sinner, MD;  Location: [facility];  Service: Urology;  Laterality: N/A;   EYE SURGERY     lasix eye surgery Bilateral    PROCTOSCOPY  01/18/2013   Procedure: PROCTOSCOPY;  Surgeon: Martina Sinner, MD;  Location: [facility];  Service: Urology;;   surgery  for acid reflux  1999   TONSILLECTOMY     TRANSFORAMINAL LUMBAR INTERBODY FUSION (TLIF) WITH PEDICLE SCREW FIXATION 1 LEVEL Left 04/04/2020   Procedure: Left Lumbar four-five Transforaminal lumbar interbody fusion;  Surgeon: Maeola Harman, MD;  Location: [facility];  Service: Neurosurgery;  Laterality: Left;   UPPER GASTROINTESTINAL ENDOSCOPY  2006   Patient Active Problem List   Diagnosis Date Noted   Left foot drop 09/30/2022   Primary osteoarthritis of right hip 07/16/2020   Acute right hip pain 06/27/2020   Elevated blood-pressure reading, without diagnosis of hypertension 04/25/2020   Spondylolisthesis of lumbar region 04/04/2020   Chronic bilateral low  back pain with left-sided sciatica 03/05/2020   Herniated nucleus pulposus, lumbar 03/05/2020   Other chronic pain 03/05/2020   Benign meningioma (HCC) 02/14/2019   Body mass index (BMI) 29.0-29.9, adult 02/14/2019   Cerebral meningioma (HCC) 08/10/2013   Hereditary and idiopathic peripheral neuropathy 07/07/2013   Type II or unspecified type diabetes mellitus without mention of complication, not stated as uncontrolled 07/07/2013   Essential hypertension 07/07/2013    PCP: Assunta Found, MD  REFERRING PROVIDER: Elinor Parkinson, [place]  REFERRING DIAG: [id number] (ICD-10-CM) - Tibialis anterior tendon tear, traumatic M79.89 (ICD-10-CM) - Mass of soft tissue of foot Z98.890 (ICD-10-CM) - Status post left foot surgery  THERAPY DIAG:  Pain in left ankle and joints  of left foot  Other abnormalities of gait and mobility  Muscle weakness (generalized)  Difficulty in walking, not elsewhere classified  Rationale for Evaluation and Treatment: Rehabilitation  ONSET DATE: 2022  SUBJECTIVE:   SUBJECTIVE STATEMENT: Pt states that after last visit she injured her low back and has hurt most of this week.  PERTINENT HISTORY: Hx of tibialis ant surgery 2022, hx of lumbar fusion 2021, neuropathy. PAIN:  Are you having pain? Yes: NPRS scale: 0/10 Pain location: L calf, around the L ankle Pain description: aching Aggravating factors: wearing shoes  Relieving factors: barefoot/wearing flip flops, elevating the legs.  PRECAUTIONS: None  RED FLAGS: None   WEIGHT BEARING RESTRICTIONS: No  FALLS:  Has patient fallen in last 6 months? No  LIVING ENVIRONMENT: Lives with: lives with their spouse Lives in: House/apartment Stairs: Yes: External: 1 steps; none Has following equipment at home: Single point cane, Walker - 4 wheeled, and shower chair  OCCUPATION: retired  PLOF: Independent and Independent with basic ADLs  PATIENT GOALS: "I like to be able to move this ankle better"  NEXT MD VISIT: when PT is done  OBJECTIVE:  Note: Objective measures were completed at Evaluation unless otherwise noted.  DIAGNOSTIC FINDINGS:  01/16/23 MRI OF THE LEFT ANKLE WITHOUT CONTRAST   IMPRESSION: 1. Intact tibialis anterior tendon. 2. Mild focal at least partial-thickness cartilage loss within the far anteromedial aspect of the tibial plafond measuring only 2 mm in transverse dimension and 3 mm in AP dimension. 3. Mild-to-moderate plantar calcaneal heel spur is similar to prior. No plantar fasciitis.  PATIENT SURVEYS:  ABC scale 860/1600 = 53.8% 06/19/23 920/1600 57.5%  COGNITION: Overall cognitive status: Within functional limits for tasks assessed     SENSATION: Light touch: Impaired  on the L   MUSCLE LENGTH: Gastrocsoleus: moderate  restriction L, mild on the R   POSTURE:  standing: slight pronation on the L  PALPATION: No tenderness on major bony landmarks and tendons around the ankles Hypomobile towards post glide on the L talocrural joint  LOWER EXTREMITY ROM:  Active ROM Right eval Left eval 06/19/23  Hip flexion [facility]   Hip extension [facility]   Hip abduction [facility]   Hip adduction     Hip internal rotation     Hip external rotation     Knee flexion [facility]   Knee extension [facility] WFL   Ankle dorsiflexion 20-0 40-22 15 was lacking 22  Ankle plantarflexion 20-40 40-50 50  Ankle inversion 0-40 0-20 30  Ankle eversion 0-25 0-12 15   (Blank rows = not tested)  LOWER EXTREMITY MMT:  MMT Right eval 06/19/23 Left eval 06/19/23  Hip flexion 3+ 4+ 3+ 4+  Hip extension 3- 3+ 4 3+  Hip abduction 4-  4- 4 4-  Hip adduction      Hip internal rotation      Hip external rotation      Knee flexion 4 4-* done prone eval sitting  4 4-,* done prone eval sitting   Knee extension 4 5 4 5   Ankle dorsiflexion 4 4 2- 3-  Ankle plantarflexion 4 4+ 4 4  Ankle inversion 4 4+ 3+ 3+  Ankle eversion 4 4+ 3+ 3+   (Blank rows = not tested)   FUNCTIONAL TESTS:  5 times sit to stand: 8.43 sec; 06/19/23:  11/9 pt back is hurting  2 minute walk test: 330 ft; 06/19/23:  361 ft.  Dynamic Gait Index: 10  DGI 1. Gait level surface (1) Moderate Impairment: Walks 20', slow speed, abnormal gait pattern, evidence for imbalance. 2. Change in gait speed (1) Moderate Impairment: Makes only minor adjustments to walking speed, or accomplishes a change in speed with significant gait deviations, or changes speed but has significant gait deviations, or changes speed but loses balance but is able to recover and continue walking. 3. Gait with horizontal head turns (1) Moderate Impairment: Performs head turns with moderate change in gait velocity, slows down, staggers but recovers, can continue to walk. 4. Gait with vertical head turns 1)  Moderate Impairment: Performs head turns with moderate change in gait velocity, slows down, staggers but recovers, can continue to walk. 5. Gait and pivot turn (1) Moderate Impairment: Turns slowly, requires verbal cueing, requires several small steps to catch balance following turn and stop. 6. Step over obstacle (1) Moderate Impairment: Is able to step over box but must stop, then step over. May require verbal cueing. 7. Step around obstacles (2) Mild Impairment: Is able to step around both cones, but must slow down and adjust steps to clear cones. 8. Stairs (2) Mild Impairment: Alternating feet, must use rail.  TOTAL SCORE: 10 / 24  GAIT: Distance walked: 330 ft Assistive device utilized: None Level of assistance: CGA Comments: done during , L knee flexed on midstance, L foot slap, trunk lat lean on the L, pelvic drop on L (Tredelenburg)                                                                                                                           TREATMENT DATE:  06/19/23:  reassessment see above.   06/12/23 NuStep, seat 8, level 2, 5' Gastrocnemius slant board x 30" x 3 Seated L tibialis ant stretch x 30" x 3 Standing heel raises x 10 x 2 Seated L toe raises x 1 lb x 3" x 10 x 2 Heel taps on small orange cones x 10 x 2 Hip vectors, RTB x 10 x 2 Tandem stance x 30" x 2 Fall reaction training x 2' Stepping over 4" hurdles x 3 rounds inside // bars  PATIENT EDUCATION:  Education details: Educated on the goals and course of rehab. Educated on measures to prevent falls at home.  Person educated: Patient Education method: Explanation Education comprehension: verbalized understanding  HOME EXERCISE PROGRAM: Access Code: [email or web address] URL: [email or web address] 06/12/2023 - Standing 3-Way Leg Reach with Resistance at Ankles and Counter Support  - 1 x daily - 7 x weekly - 2 sets - 10 reps - Standing Tandem Balance with Counter Support  - 2 x daily - 7 x  weekly - 2 reps - 30 hold  06/09/2023 - Seated Ankle Alphabet  - 2 x daily - 7 x weekly - 1 sets - Seated Anterior Tibialis Stretch  - 2 x daily - 7 x weekly - 3 reps - 30 hold - Heel Raises with Counter Support  - 2 x daily - 7 x weekly - 3 sets - 10 reps  Date: 05/29/2023 Prepared by: Virgina Organ  Exercises - Seated Toe Raise  - 3 x daily - 7 x weekly - 1 sets - 10 reps - 5 seconds  hold - Long Sitting Ankle Dorsiflexion with Anchored Resistance  - 3 x daily - 7 x weekly - 1 sets - 10 reps - 5 hold - Long Sitting Ankle Eversion with Resistance  - 3 x daily - 7 x weekly - 1 sets - 10 reps - 5 hold - Long Sitting Ankle Inversion with Anchored Resistance  - 3 x daily - 7 x weekly - 1 sets - 10 reps - 5 hold - Standing Ankle Dorsiflexion Stretch  - 3 x daily - 7 x weekly - 1 sets - 3 reps - 30" hold - Toe Raises with Counter Support  - 3 x daily - 7 x weekly - 1 sets - 10 reps - 3-5 seconds  hold - Sit to Stand  - 3 x daily - 7 x weekly - 1 sets - 10 reps ASSESSMENT:  CLINICAL IMPRESSION: I.   OBJECTIVE IMPAIRMENTS: Abnormal gait, decreased activity tolerance, decreased balance, decreased mobility, difficulty walking, decreased ROM, decreased strength, hypomobility, impaired flexibility, and pain.   ACTIVITY LIMITATIONS: carrying, lifting, bending, standing, squatting, and stairs  PARTICIPATION LIMITATIONS: meal prep, cleaning, laundry, shopping, community activity, and yard work  PERSONAL FACTORS: Age, Time since onset of injury/illness/exacerbation, and 1-2 comorbidities: hx of lumbar fusion 2021, neuropathy  are also affecting patient's functional outcome.   REHAB POTENTIAL: Fair    CLINICAL DECISION MAKING: Evolving/moderate complexity  EVALUATION COMPLEXITY: Moderate   GOALS: Goals reviewed with patient? Yes  SHORT TERM GOALS: Target date: 06/04/23 Pt will demonstrate indep in HEP to facilitate carry-over of skilled services and improve functional outcomes Goal  status:met  LONG TERM GOALS: Target date: 06/18/23  Pt will improve ABC scale score by 10% in order to demonstrate improved confidence with balance and safety during ambulation and other ADLs Baseline: 53.8% Goal status: ON GOING     2.  Pt will increase by at least 40 ft in order to demonstrate clinically significant improvement in community ambulation Baseline: 330 ft Goal status: not met   3.  Pt will improve DGI by at least 3 points in order to demonstrate clinically significant improvement in balance and decreased risk for falls Baseline: 10 Goal status: not met   4.  Patient will demonstrate increase in ankle DF ROM by 10 deg to facilitate ease in ambulation  Baseline: see above Goal status: in progress  5.  Pt will demonstrate increase in ankle DF strength 3 to 3+/5 to facilitate ease and safety in ambulation  Baseline: 2-/5 Goal status: not met    PLAN:  PT  FREQUENCY: 2x/week  PT DURATION: 4 weeks  PLANNED INTERVENTIONS: 97164- PT Re-evaluation, 97110-Therapeutic exercises, 97530- Therapeutic activity, 97112- Neuromuscular re-education, 97535- Self Care, 40981- Manual therapy, [phone]- Gait training, 97014- Electrical stimulation (unattended), Patient/Family education, and Stair training  PLAN FOR NEXT SESSION: Continue POC and may progress as tolerated with emphasis on balance activity, LE strengthening and mobility activities. Virgina Organ, PT CLT [phone]

## 2023-06-22 ENCOUNTER — Telehealth (HOSPITAL_COMMUNITY): Payer: Self-pay | Admitting: Physical Therapy

## 2023-06-22 NOTE — Telephone Encounter (Signed)
 Called pt re she might be a candidate for the [facility].  Left message that if she is interested she can call the office.  Virgina Organ, PT CLT [phone]

## 2023-07-31 DIAGNOSIS — H35431 Paving stone degeneration of retina, right eye: Secondary | ICD-10-CM | POA: Diagnosis not present

## 2023-07-31 DIAGNOSIS — H3589 Other specified retinal disorders: Secondary | ICD-10-CM | POA: Diagnosis not present

## 2023-07-31 DIAGNOSIS — H31092 Other chorioretinal scars, left eye: Secondary | ICD-10-CM | POA: Diagnosis not present

## 2023-07-31 DIAGNOSIS — H43813 Vitreous degeneration, bilateral: Secondary | ICD-10-CM | POA: Diagnosis not present

## 2023-08-05 ENCOUNTER — Other Ambulatory Visit: Payer: Self-pay | Admitting: Podiatry

## 2023-08-07 ENCOUNTER — Encounter: Payer: Self-pay | Admitting: Endocrinology

## 2023-08-07 ENCOUNTER — Other Ambulatory Visit: Payer: Self-pay

## 2023-08-07 DIAGNOSIS — E119 Type 2 diabetes mellitus without complications: Secondary | ICD-10-CM

## 2023-08-07 MED ORDER — TIRZEPATIDE 7.5 MG/0.5ML ~~LOC~~ SOAJ
SUBCUTANEOUS | 3 refills | Status: DC
Start: 2023-08-07 — End: 2023-11-10

## 2023-09-03 ENCOUNTER — Other Ambulatory Visit: Payer: Self-pay | Admitting: Podiatry

## 2023-09-22 ENCOUNTER — Telehealth: Payer: Self-pay | Admitting: Podiatry

## 2023-09-22 NOTE — Telephone Encounter (Signed)
 [employer] faxed over Peer to Peer for Dr Lara Plants per Odilia Bennett faxed to [facility] requesting 09/29/2023 at 1130am.

## 2023-10-02 ENCOUNTER — Other Ambulatory Visit: Payer: Self-pay | Admitting: Podiatry

## 2023-10-05 ENCOUNTER — Other Ambulatory Visit (HOSPITAL_COMMUNITY): Payer: Self-pay | Admitting: Family Medicine

## 2023-10-05 DIAGNOSIS — M797 Fibromyalgia: Secondary | ICD-10-CM | POA: Diagnosis not present

## 2023-10-05 DIAGNOSIS — Z6828 Body mass index (BMI) 28.0-28.9, adult: Secondary | ICD-10-CM | POA: Diagnosis not present

## 2023-10-05 DIAGNOSIS — E559 Vitamin D deficiency, unspecified: Secondary | ICD-10-CM

## 2023-10-06 ENCOUNTER — Other Ambulatory Visit (HOSPITAL_COMMUNITY): Payer: Self-pay | Admitting: Family Medicine

## 2023-10-06 DIAGNOSIS — Z1231 Encounter for screening mammogram for malignant neoplasm of breast: Secondary | ICD-10-CM

## 2023-10-29 ENCOUNTER — Ambulatory Visit: Admitting: Podiatry

## 2023-10-29 DIAGNOSIS — M797 Fibromyalgia: Secondary | ICD-10-CM | POA: Diagnosis not present

## 2023-11-09 ENCOUNTER — Other Ambulatory Visit: Payer: Self-pay | Admitting: Medical Genetics

## 2023-11-10 ENCOUNTER — Ambulatory Visit: Payer: Self-pay | Admitting: Endocrinology

## 2023-11-10 ENCOUNTER — Encounter: Payer: Self-pay | Admitting: Endocrinology

## 2023-11-10 ENCOUNTER — Ambulatory Visit (INDEPENDENT_AMBULATORY_CARE_PROVIDER_SITE_OTHER): Admitting: Endocrinology

## 2023-11-10 VITALS — BP 138/80 | HR 94 | Resp 20 | Ht 64.0 in | Wt 165.6 lb

## 2023-11-10 DIAGNOSIS — E119 Type 2 diabetes mellitus without complications: Secondary | ICD-10-CM | POA: Diagnosis not present

## 2023-11-10 DIAGNOSIS — Z7985 Long-term (current) use of injectable non-insulin antidiabetic drugs: Secondary | ICD-10-CM

## 2023-11-10 DIAGNOSIS — E042 Nontoxic multinodular goiter: Secondary | ICD-10-CM | POA: Diagnosis not present

## 2023-11-10 LAB — POCT GLYCOSYLATED HEMOGLOBIN (HGB A1C): Hemoglobin A1C: 5.9 % — AB (ref 4.0–5.6)

## 2023-11-10 MED ORDER — TIRZEPATIDE 10 MG/0.5ML ~~LOC~~ SOAJ: 10.0000 mg | SUBCUTANEOUS | 3 refills | Status: AC

## 2023-11-10 NOTE — Progress Notes (Signed)
 Outpatient Endocrinology Note [place] Nyaira Hodgens, MD  11/10/23  Patient's Name: Mindy Ellis    DOB: January 18, 1951    MRN: [id number]                                                    REASON OF VISIT: Follow-up of type 2 diabetes mellitus  REFERRING PROVIDER:  Marvine Rush, MD  PCP: Marvine Rush, MD  HISTORY OF PRESENT ILLNESS:   Mindy Ellis is a 73 y.o. old female with past medical history listed below, is here for follow-up of type 2 diabetes mellitus.   Pertinent Diabetes History: Patient was diagnosed with type 2 diabetes mellitus around 2010. She takes oral and injectable steroid frequently for back pain/sciatic pain, worsening her diabetes control.  She has no personal history of pancreatitis, no family history of medullary thyroid  cancer and MEM 2 syndrome.  Chronic Diabetes Complications : Retinopathy: no. Last ophthalmology exam was done on annually, reportedly. Nephropathy: CKD Peripheral neuropathy: yes ?, gabapentin . Coronary artery disease: no Stroke: no  Relevant comorbidities and cardiovascular risk factors: Obesity: yes Body mass index is 28.43 kg/m.  Hypertension: yes Hyperlipidemia. Yes, on a statin.  Current / Home Diabetic regimen includes: Mounjaro  7.5 mg weekly.  Mounjaro  was started in July, 2024.  Prior diabetic medications: Metformin Januvia Glimepiride stopped in December 2024 due to improvement of diabetes control and hypoglycemia.  Glycemic data:   Accu-Chek guide glucometer data from July 22 to November 10, 2023 reviewed average blood sugar 104 lowest blood sugar 98 and highest 114, she has been checking blood sugars few times a week, levels blood sugars 114, 98, 100, 105.  Hypoglycemia: Patient has no hypoglycemic episodes. Patient has hypoglycemia awareness.  Factors modifying glucose control: 1.  Diabetic diet assessment: 3 meals a day.  2.  Staying active or exercising: Limited physical activity, no formal exercise due to sciatic  pain and back pain.  3.  Medication compliance: compliant all of the time.  # Thyroid  nodules: Ultrasound thyroid  in January 2023 assistant with heterogeneous multinodular thyroid  gland with bilateral thyroid  nodules mostly spongiform and complex.  Right spongiform nodule measuring 2.2 cm, isthmus is spongiform nodule measuring 1.5 cm, left complex nodule measuring 2.2 cm and left spongiform nodule measuring 1.4 cm.  Stable compared to ultrasound in July 2021.  # Low TSH Outside lab records reviewed, TSH 0. 415 in 05/2022 (reference range0.450 - 4.500), mildly low TSH at that time, she had normal TSH in the past 0.462.  Patient denies complaints of palpitation, heat intolerance.  No change in bowel habit.  No weight loss.  No neck discomfort or dysphagia.  No redness or watering of the eyes.  Family history of thyroid  disorder in mother.  Repeat thyroid  function test in September 2024 on initial visit was normal as follows.   Latest Reference Range & Units 12/17/22 10:32  TSH 0.35 - 5.50 uIU/mL 0.78  Triiodothyronine,Free,Serum 2.3 - 4.2 pg/mL 3.5  T4,Free(Direct) 0.60 - 1.60 ng/dL 9.20    Interval history Glucometer data as reviewed above.  Hemoglobin A1c 5.9%.  She has been tolerating Mounjaro  well and currently taking 7.5 mg weekly.  She lost mild weight after increasing the dose of Mounjaro .  She has no other complaints today.  REVIEW OF SYSTEMS As per history of present illness.   PAST MEDICAL HISTORY:  Past Medical History:  Diagnosis Date   Abdominal pain    Acid reflux    Chronic diarrhea    DDD (degenerative disc disease)    Depression    Diabetes mellitus without complication (HCC)    diet controlled   Dysphagia    Fibromyalgia    GERD (gastroesophageal reflux disease)    Hypertension    Meningioma (HCC)    PONV (postoperative nausea and vomiting)    nausea, pt must not vomit due to reflux surgery    PAST SURGICAL HISTORY: Past Surgical History:  Procedure  Laterality Date   ABDOMINAL HYSTERECTOMY  age 22   ANTERIOR AND POSTERIOR REPAIR N/A 01/18/2013   Procedure: ANTERIOR (CYSTOCELE) ;  Surgeon: Glendia DELENA Elizabeth, MD;  Location: [facility];  Service: Urology;  Laterality: N/A;   APPENDECTOMY  age 11   both ovaries removed   BREAST SURGERY Left 21 yrs ago   benign lump removed   CHOLECYSTECTOMY     COLONOSCOPY  11/11/07   NUR   CYSTOSCOPY N/A 01/18/2013   Procedure: CYSTOSCOPY;  Surgeon: Glendia DELENA Elizabeth, MD;  Location: [facility];  Service: Urology;  Laterality: N/A;   EYE SURGERY     lasix eye surgery Bilateral    PROCTOSCOPY  01/18/2013   Procedure: PROCTOSCOPY;  Surgeon: Glendia DELENA Elizabeth, MD;  Location: [facility];  Service: Urology;;   surgery  for acid reflux  1999   TONSILLECTOMY     TRANSFORAMINAL LUMBAR INTERBODY FUSION (TLIF) WITH PEDICLE SCREW FIXATION 1 LEVEL Left 04/04/2020   Procedure: Left Lumbar four-five Transforaminal lumbar interbody fusion;  Surgeon: Unice Pac, MD;  [facility];  Service: Neurosurgery;  Laterality: Left;   UPPER GASTROINTESTINAL ENDOSCOPY  2006    ALLERGIES: Allergies  Allergen Reactions   Latex    Mirtazapine Nausea Only   Naproxen Sodium     Upsets stomach   Rofecoxib    Statins Other (See Comments)    Muscle aches   Tape    Zolpidem Tartrate Other (See Comments)    Ambien= confusion   Elemental Sulfur Rash   Erythromycin Rash   Penicillins Rash   Sulfa Antibiotics Hives and Rash   Tetracyclines & Related Rash    FAMILY HISTORY:  Family History  Problem Relation Age of Onset   Drug abuse Other    Depression Mother    Asthma Mother    COPD Mother    Anxiety disorder Maternal Grandmother    Depression Maternal Grandmother    COPD Sister     SOCIAL HISTORY: Social History   Socioeconomic History   Marital status: Married    Spouse name: Tina Gruner   Number of children: 1   Years of education: 12   Highest education level: 12th grade  Occupational History   Not on  file  Tobacco Use   Smoking status: Never    Passive exposure: Never   Smokeless tobacco: Never  Vaping Use   Vaping status: Never Used  Substance and Sexual Activity   Alcohol use: Yes    Comment: occ wine   Drug use: No   Sexual activity: Yes    Partners: Male    Birth control/protection: Surgical  Other Topics Concern   Not on file  Social History Narrative   Not on file   Social Drivers of Health   Financial Resource Strain: Low Risk  (11/14/2021)   Overall Financial Resource Strain (CARDIA)    Difficulty of Paying Living Expenses: Not hard at  all  Food Insecurity: No Food Insecurity (11/14/2021)   Hunger Vital Sign    Worried About Running Out of Food in the Last Year: Never true    Ran Out of Food in the Last Year: Never true  Transportation Needs: No Transportation Needs (11/14/2021)   PRAPARE - Administrator, Civil Service (Medical): No    Lack of Transportation (Non-Medical): No  Physical Activity: Insufficiently Active (11/14/2021)   Exercise Vital Sign    Days of Exercise per Week: 3 days    Minutes of Exercise per Session: 30 min  Stress: No Stress Concern Present (11/14/2021)   [employer] of Occupational Health - Occupational Stress Questionnaire    Feeling of Stress : Only a little  Social Connections: Socially Integrated (11/14/2021)   Social Connection and Isolation Panel    Frequency of Communication with Friends and Family: More than three times a week    Frequency of Social Gatherings with Friends and Family: More than three times a week    Attends Religious Services: More than 4 times per year    Active Member of [employer] or Organizations: Yes    Attends Engineer, structural: More than 4 times per year    Marital Status: Married    MEDICATIONS:  Current Outpatient Medications  Medication Sig Dispense Refill   amitriptyline  (ELAVIL ) 50 MG tablet Take 50 mg by mouth at bedtime.     Blood Glucose Monitoring Suppl DEVI 1 each by  Does not apply route in the morning, at noon, and at bedtime. May substitute to any manufacturer covered by patient's insurance. 1 each 0   Calcium  Carbonate-Vitamin D  (CALCIUM  + D PO) Take 600 mg by mouth daily.     cholecalciferol  (VITAMIN D3) 25 MCG (1000 UNIT) tablet Take 2,000 Units by mouth daily.     diltiazem  (CARDIZEM  CD) 180 MG 24 hr capsule Take 180 mg by mouth daily.     gabapentin  (NEURONTIN ) 300 MG capsule TAKE 1 CAPSULE BY MOUTH THREE TIMES DAILY 90 capsule 0   Glucose Blood (BLOOD GLUCOSE TEST STRIPS) STRP 1 each by In Vitro route daily. May substitute to any manufacturer covered by patient's insurance. 100 each 3   MAGNESIUM  PO Take by mouth.     rosuvastatin (CRESTOR) 5 MG tablet Take 5 mg by mouth daily.     tirzepatide  (MOUNJARO ) 10 MG/0.5ML Pen Inject 10 mg into the skin once a week. 6 mL 3   traZODone  (DESYREL ) 100 MG tablet Take 100 mg by mouth at bedtime.     No current facility-administered medications for this visit.    PHYSICAL EXAM: Vitals:   11/10/23 1312  BP: 138/80  Pulse: 94  Resp: 20  SpO2: 99%  Weight: 165 lb 9.6 oz (75.1 kg)  Height: 5' 4 (1.626 m)     Body mass index is 28.43 kg/m.  Wt Readings from Last 3 Encounters:  11/10/23 165 lb 9.6 oz (75.1 kg)  06/18/23 168 lb 12.8 oz (76.6 kg)  03/19/23 174 lb 6.4 oz (79.1 kg)    General: Well developed, well nourished female in no apparent distress.  HEENT: AT/[place], no external lesions.  Eyes: Conjunctiva clear and no icterus. Neck: Neck supple , no thyromegaly, no palpable thyroid  nodule Lungs: Respirations not labored Neurologic: Alert, oriented, normal speech, DTR 2+ Extremities / Skin: Dry.   Psychiatric: Does not appear depressed or anxious  Diabetic Foot Exam - Simple   No data filed     LABS  Reviewed Lab Results  Component Value Date   HGBA1C 5.9 (A) 11/10/2023   HGBA1C 5.7 (A) 06/18/2023   HGBA1C 5.3 03/19/2023   No results found for: FRUCTOSAMINE No results found for:  CHOL, HDL, LDLCALC, LDLDIRECT, TRIG, CHOLHDL Lab Results  Component Value Date   MICRALBCREAT 18 03/19/2023   Lab Results  Component Value Date   CREATININE 1.02 (H) 07/12/2022   No results found for: GFR  ASSESSMENT / PLAN  1. Type 2 diabetes mellitus without complication, without long-term current use of insulin  (HCC)   2. Multiple thyroid  nodules     Diabetes Mellitus type 2, complicated by no known complications. - Diabetic status / severity: Controlled  Lab Results  Component Value Date   HGBA1C 5.9 (A) 11/10/2023    - Hemoglobin A1c goal : <6.5%  - Medications: See below  I) increase Mounjaro  from 7.5 mg to 10 mg weekly.  To have weight loss benefit.  - Home glucose testing: In the morning fasting and occasionally other times of the day. - Discussed/ Gave Hypoglycemia treatment plan.  # Consult : not required at this time.   # Annual urine for microalbuminuria/ creatinine ratio, no microalbuminuria currently.. Last  Lab Results  Component Value Date   MICRALBCREAT 18 03/19/2023    # Foot check nightly. ?  Diabetic neuropathy, on gabapentin  by primary care provider, ?  Podiatry.  # Annual dilated diabetic eye exams.   - Diet: Make healthy diabetic food choices - Life style / activity / exercise: Discussed.  2. Blood pressure  -  BP Readings from Last 1 Encounters:  11/10/23 138/80    - Control is in target.  - No change in current plans.  3. Lipid status / Hyperlipidemia - Last No results found for: LDLCALC LDL 111. - Continue Crestor 5 mg daily.  Managed by primary care provider.  # Multiple thyroid  nodules -She has bilateral multiple thyroid  nodules, ultrasound thyroid  in July 2021 and January 2023 showed mostly spongiform and complex thyroid  nodule.  No worrisome features.  No indication of needle biopsy at this time.  These nodules needs monitoring with serial ultrasound.    Diagnoses and all orders for this visit:  Type 2  diabetes mellitus without complication, without long-term current use of insulin  (HCC) -     POCT glycosylated hemoglobin (Hb A1C) -     tirzepatide  (MOUNJARO ) 10 MG/0.5ML Pen; Inject 10 mg into the skin once a week.  Multiple thyroid  nodules    DISPOSITION Follow up in clinic in 4 months suggested.  Labs on the same day of the visit.   All questions answered and patient verbalized understanding of the plan.  [place] Latise Dilley, MD [facility] Endocrinology [facility] [address] [place], [address] [place], [place] [address] Phone # [phone]  At least part of this note was generated using voice recognition software. Inadvertent word errors may have occurred, which were not recognized during the proofreading process.

## 2023-11-11 DIAGNOSIS — E119 Type 2 diabetes mellitus without complications: Secondary | ICD-10-CM | POA: Diagnosis not present

## 2023-11-12 ENCOUNTER — Other Ambulatory Visit (HOSPITAL_COMMUNITY)
Admission: RE | Admit: 2023-11-12 | Discharge: 2023-11-12 | Disposition: A | Discharge status: Home / Self Care | Payer: Self-pay | Source: Ambulatory Visit | Attending: Oncology | Admitting: Oncology

## 2023-11-12 ENCOUNTER — Other Ambulatory Visit: Payer: Self-pay | Admitting: Podiatry

## 2023-11-16 ENCOUNTER — Ambulatory Visit (HOSPITAL_COMMUNITY)
Admission: RE | Admit: 2023-11-16 | Discharge: 2023-11-16 | Disposition: A | Discharge status: Home / Self Care | Source: Ambulatory Visit | Attending: Family Medicine | Admitting: Family Medicine

## 2023-11-16 ENCOUNTER — Encounter (HOSPITAL_COMMUNITY): Payer: Self-pay

## 2023-11-16 DIAGNOSIS — Z780 Asymptomatic menopausal state: Secondary | ICD-10-CM | POA: Diagnosis not present

## 2023-11-16 DIAGNOSIS — Z13820 Encounter for screening for osteoporosis: Secondary | ICD-10-CM | POA: Insufficient documentation

## 2023-11-16 DIAGNOSIS — E1140 Type 2 diabetes mellitus with diabetic neuropathy, unspecified: Secondary | ICD-10-CM | POA: Insufficient documentation

## 2023-11-16 DIAGNOSIS — Z1231 Encounter for screening mammogram for malignant neoplasm of breast: Secondary | ICD-10-CM | POA: Insufficient documentation

## 2023-11-16 DIAGNOSIS — M8589 Other specified disorders of bone density and structure, multiple sites: Secondary | ICD-10-CM | POA: Diagnosis not present

## 2023-11-16 DIAGNOSIS — E559 Vitamin D deficiency, unspecified: Secondary | ICD-10-CM | POA: Insufficient documentation

## 2023-11-17 ENCOUNTER — Encounter: Payer: Self-pay | Admitting: Podiatry

## 2023-11-17 ENCOUNTER — Ambulatory Visit (INDEPENDENT_AMBULATORY_CARE_PROVIDER_SITE_OTHER): Admitting: Podiatry

## 2023-11-17 DIAGNOSIS — M19072 Primary osteoarthritis, left ankle and foot: Secondary | ICD-10-CM | POA: Diagnosis not present

## 2023-11-17 DIAGNOSIS — L600 Ingrowing nail: Secondary | ICD-10-CM

## 2023-11-17 MED ORDER — TRIAMCINOLONE ACETONIDE 40 MG/ML IJ SUSP
20.0000 mg | Freq: Once | INTRAMUSCULAR | Status: AC
  Administered 2023-11-17 (×2): 20 mg

## 2023-11-17 MED ORDER — NEOMYCIN-POLYMYXIN-HC 1 % OT SOLN: OTIC | 1 refills | Status: AC

## 2023-11-17 NOTE — Patient Instructions (Signed)

## 2023-11-18 NOTE — Progress Notes (Signed)
 She presents today states that I just get pain all over this foot that she is referring to the left foot she continues her gabapentin 300 mg 3 times daily.  She is also complaining of a painful ingrown toenail to the hallux right.  She states has been there for some time and troublesome.  Objective: Vital signs are stable alert oriented x 3.  Pulses are palpable.  Significant osteoarthritis build up and tenderness across the dorsal aspect of the foot at the tarsometatarsal joints left foot.  Dorsal spurring is noted.  Pain particular around the deep peroneal nerve is noted.  Hallux right demonstrates sharp incurvated nail margins tibial-fibular border with erythema and edema.  No purulence no malodor.  Assessment: Osteoarthritis synovitis with neuritis left foot.  Neuropathy.  Right foot demonstrates ingrown toenail hallux right.  Plan: Discussed etiology pathology conservative surgical therapies at this point chemical matricectomy was performed to the tibial and fibular border of the hallux right.  She tolerated this procedure well after local anesthetic was administered.  After prepping the nail was split from distal to proximal along the borders removing the margin of the nail exposing the matrix.  Also expose the matrix to 3 applications of phenol 30 seconds each neutralized with isopropyl alcohol Silvadene cream Telfa pad and dressed a compressive dressing was applied.  She was given both oral written home-going instructions the care and soaking of the toe as well as a prescription for Cortisporin Otic to be applied twice daily after soaking.  She received an injection 10 mg of Kenalog 5 mg Marcaine across the dorsal aspect of the left foot to help with the dorsal osteoarthritic pain.

## 2023-11-23 LAB — GENECONNECT MOLECULAR SCREEN: Genetic Analysis Overall Interpretation: NEGATIVE

## 2023-12-01 ENCOUNTER — Ambulatory Visit: Admitting: Podiatry

## 2023-12-01 DIAGNOSIS — D1801 Hemangioma of skin and subcutaneous tissue: Secondary | ICD-10-CM | POA: Diagnosis not present

## 2023-12-01 DIAGNOSIS — I781 Nevus, non-neoplastic: Secondary | ICD-10-CM | POA: Diagnosis not present

## 2023-12-01 DIAGNOSIS — L821 Other seborrheic keratosis: Secondary | ICD-10-CM | POA: Diagnosis not present

## 2023-12-09 ENCOUNTER — Other Ambulatory Visit: Payer: Self-pay | Admitting: Podiatry

## 2023-12-17 ENCOUNTER — Other Ambulatory Visit (HOSPITAL_COMMUNITY): Payer: Self-pay | Admitting: Neurological Surgery

## 2023-12-17 ENCOUNTER — Ambulatory Visit: Admitting: Podiatry

## 2023-12-17 ENCOUNTER — Encounter: Payer: Self-pay | Admitting: Podiatry

## 2023-12-17 ENCOUNTER — Ambulatory Visit (INDEPENDENT_AMBULATORY_CARE_PROVIDER_SITE_OTHER): Admitting: Podiatry

## 2023-12-17 DIAGNOSIS — G5762 Lesion of plantar nerve, left lower limb: Secondary | ICD-10-CM | POA: Diagnosis not present

## 2023-12-17 DIAGNOSIS — D329 Benign neoplasm of meninges, unspecified: Secondary | ICD-10-CM

## 2023-12-17 DIAGNOSIS — G5782 Other specified mononeuropathies of left lower limb: Secondary | ICD-10-CM

## 2023-12-17 MED ORDER — TRIAMCINOLONE ACETONIDE 40 MG/ML IJ SUSP
20.0000 mg | Freq: Once | INTRAMUSCULAR | Status: AC
  Administered 2023-12-17: 20 mg

## 2023-12-17 NOTE — Progress Notes (Signed)
 She presents today for follow-up of a matrixectomy to the hallux right.  She states that is doing just fine no problems whatsoever.  She states that she is ready for her pedicure.  She does relate that she is having pain to the dorsal lateral aspect of the left foot in the third interdigital space with pain radiating into the toes.  Objective: Vital signs are stable alert oriented x 3.  Pulses are palpable.  She has swollen lipomas anterior lateral ankles bilaterally.  Her right great toe matricectomy appears to have healed uneventfully with no erythema cellulitis drainage or odor.  Left foot does still demonstrate a palpable Mulder's click to the third interdigital space.  Assessment: Neuroma third interdigital space left foot.  Well-healing surgical toe hallux right.  Plan: Injected the third interspace today with 10 mg Kenalog  5 mg Marcaine  to the point of maximal tenderness.  Tolerated procedure well without complications.  Would like to follow-up with her on an as-needed basis.

## 2023-12-25 ENCOUNTER — Ambulatory Visit (HOSPITAL_COMMUNITY)
Admission: RE | Admit: 2023-12-25 | Discharge: 2023-12-25 | Disposition: A | Discharge status: Home / Self Care | Source: Ambulatory Visit | Attending: Neurological Surgery | Admitting: Neurological Surgery

## 2023-12-25 DIAGNOSIS — D329 Benign neoplasm of meninges, unspecified: Secondary | ICD-10-CM | POA: Diagnosis not present

## 2023-12-25 MED ORDER — GADOBUTROL 1 MMOL/ML IV SOLN
7.5000 mL | Freq: Once | INTRAVENOUS | Status: AC | PRN
  Administered 2023-12-25: 7.5 mL via INTRAVENOUS

## 2024-01-07 ENCOUNTER — Other Ambulatory Visit: Payer: Self-pay | Admitting: Podiatry

## 2024-02-02 ENCOUNTER — Other Ambulatory Visit: Payer: Self-pay | Admitting: Podiatry

## 2024-02-09 IMAGING — MG MM DIGITAL SCREENING BILAT W/ TOMO AND CAD
8 series · 8 of 24 positions shown · non-contrast
Comparison: Previous exam(s).

CLINICAL DATA: Screening.

EXAM:
DIGITAL SCREENING BILATERAL MAMMOGRAM WITH TOMOSYNTHESIS AND CAD
TECHNIQUE: Bilateral screening digital craniocaudal and mediolateral oblique
mammograms were obtained. Bilateral screening digital breast
tomosynthesis was performed. The images were evaluated with
computer-aided detection.

[R CC synth-2D]
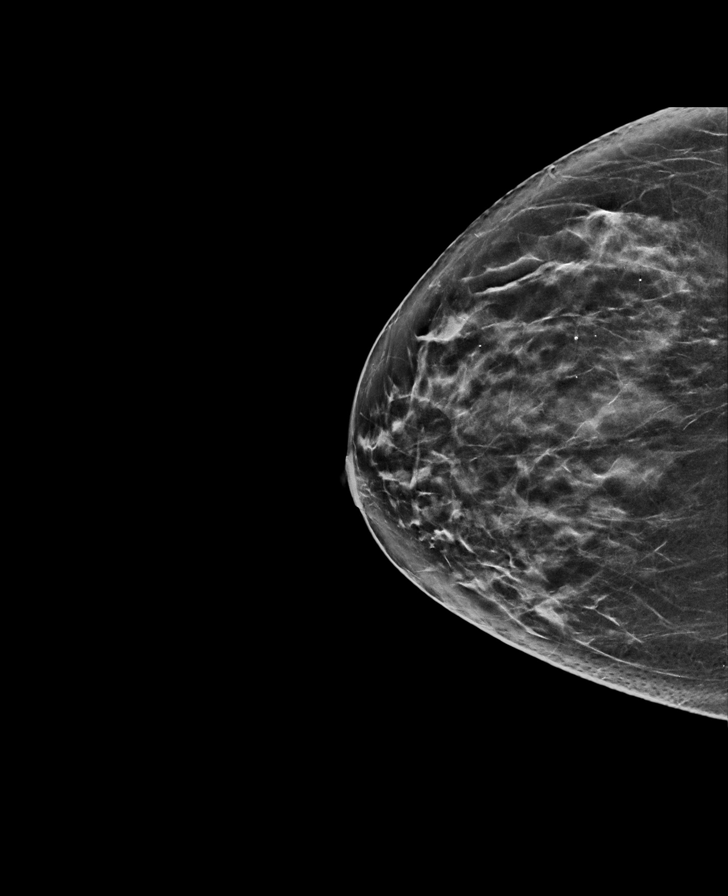

[R MLO synth-2D]
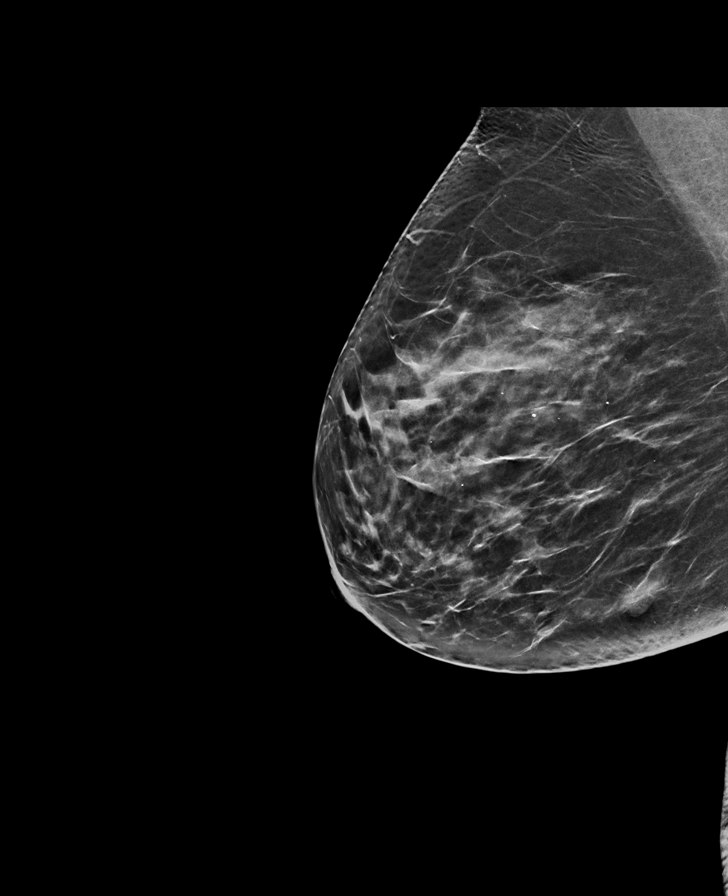

[L MLO synth-2D]
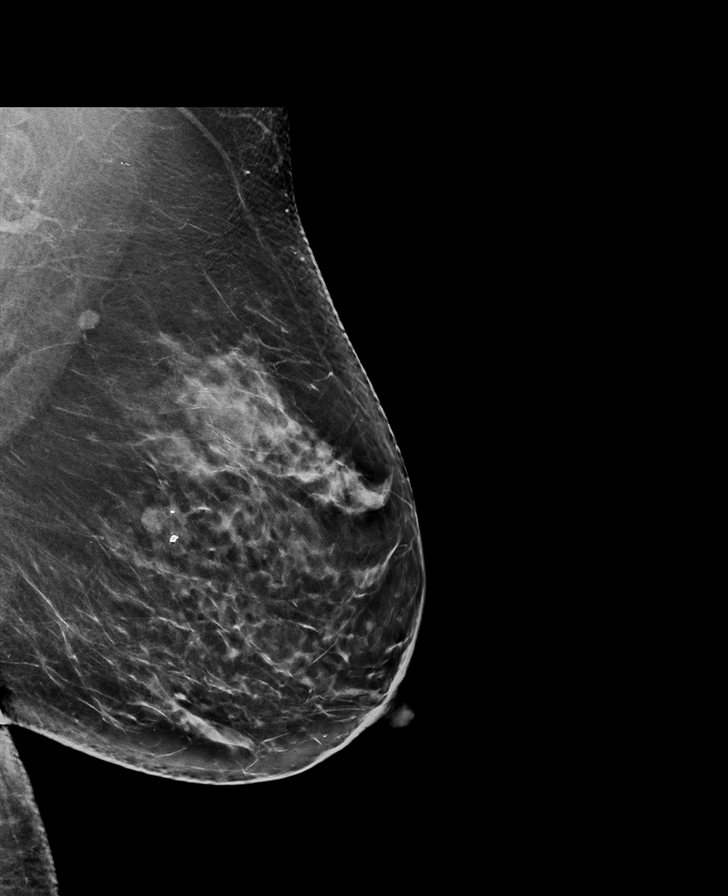

[L CC synth-2D]
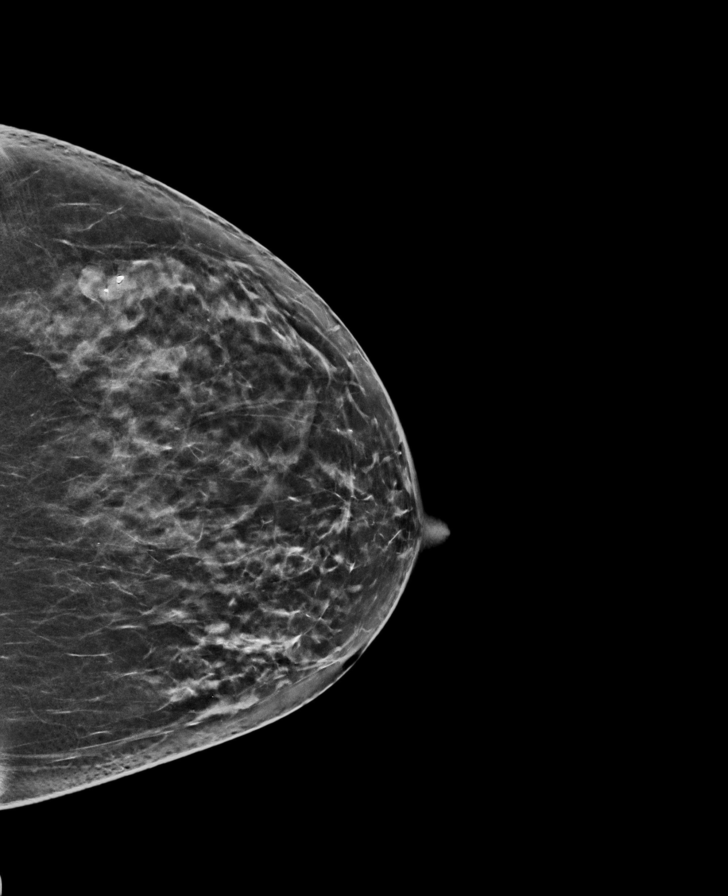

[R CC tomo · tomo slice 33/65.0]
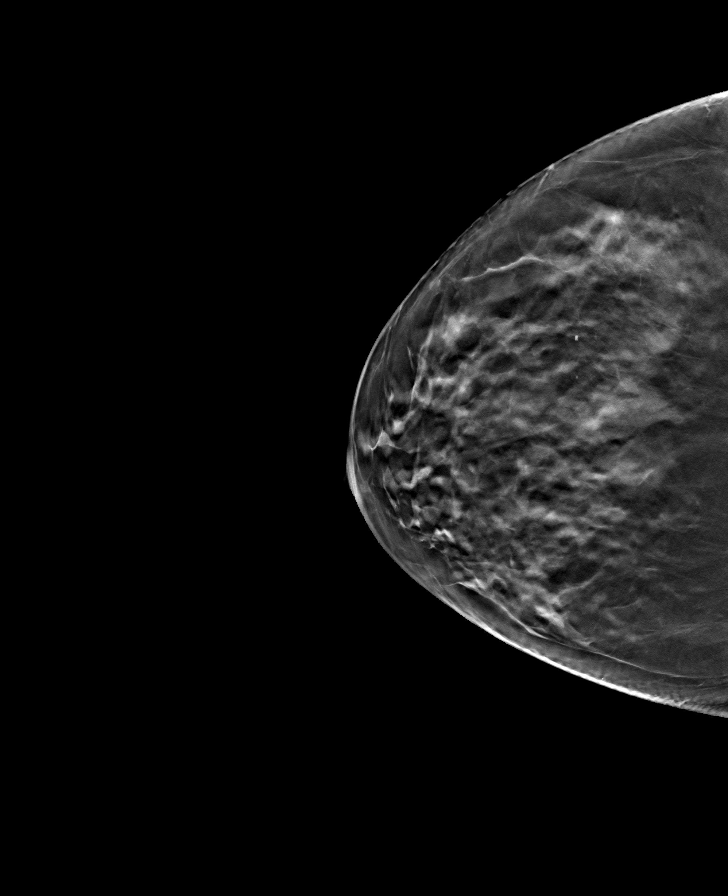

[L CC tomo · tomo slice 32/63.0]
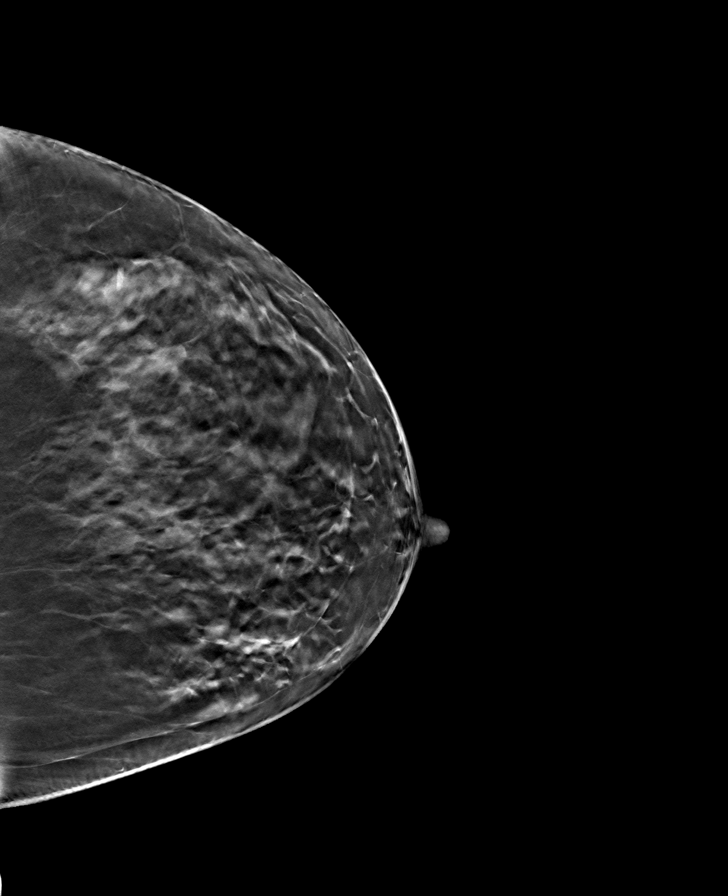

[R MLO tomo · tomo slice 35/69.0]
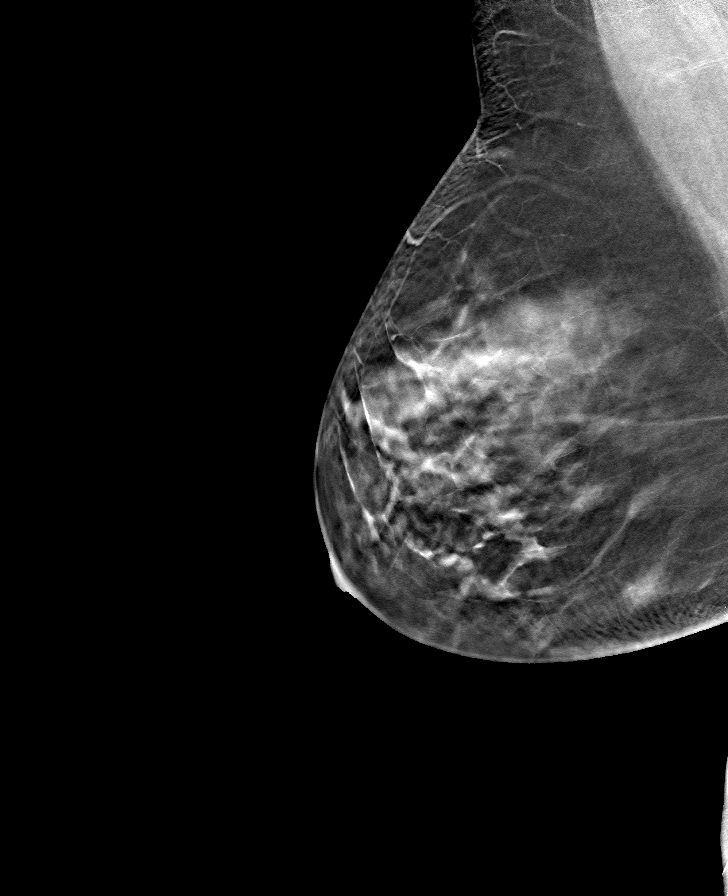

[L MLO tomo · tomo slice 39/78.0]
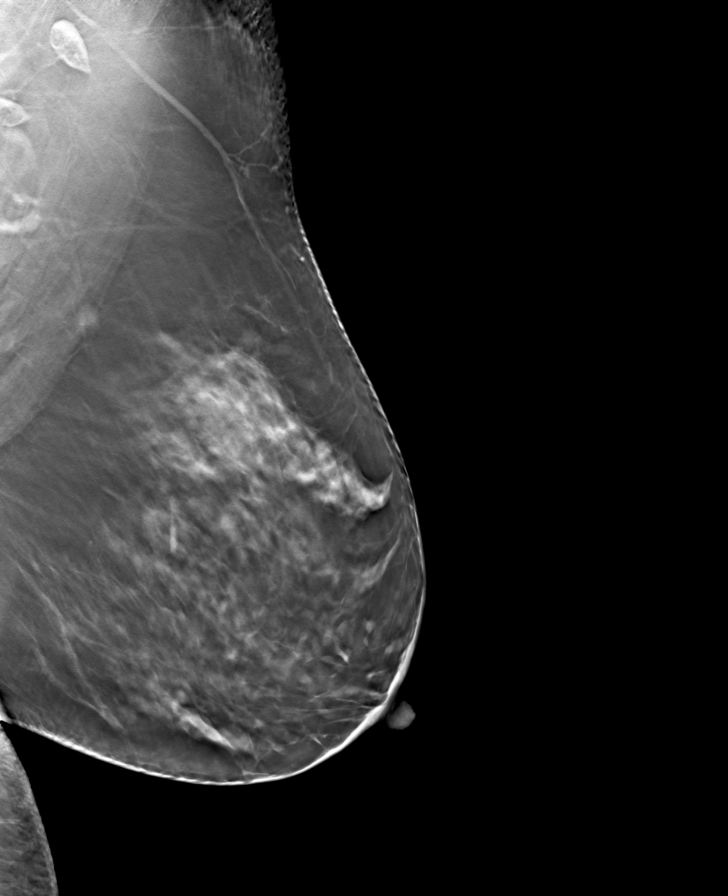

[8 of 24 positions shown; findings below may reference images not displayed]

ACR Breast Density Category d: The breast tissue is extremely dense,
which lowers the sensitivity of mammography
FINDINGS: There are no findings suspicious for malignancy.
IMPRESSION: No mammographic evidence of malignancy. A result letter of this
screening mammogram will be mailed directly to the patient.

RECOMMENDATION:
Screening mammogram in one year. (Code:TA-V-WV9)

BI-RADS CATEGORY  1: Negative.

## 2024-03-14 ENCOUNTER — Ambulatory Visit: Admitting: Endocrinology

## 2024-05-05 ENCOUNTER — Ambulatory Visit: Admitting: Endocrinology

## 2024-05-05 ENCOUNTER — Ambulatory Visit: Payer: Self-pay | Admitting: Endocrinology

## 2024-05-05 ENCOUNTER — Other Ambulatory Visit

## 2024-05-05 ENCOUNTER — Encounter: Payer: Self-pay | Admitting: Endocrinology

## 2024-05-05 VITALS — BP 132/72 | HR 89 | Ht 63.0 in | Wt 155.6 lb

## 2024-05-05 DIAGNOSIS — E119 Type 2 diabetes mellitus without complications: Secondary | ICD-10-CM

## 2024-05-05 DIAGNOSIS — E042 Nontoxic multinodular goiter: Secondary | ICD-10-CM

## 2024-05-05 DIAGNOSIS — E663 Overweight: Secondary | ICD-10-CM

## 2024-05-05 LAB — POCT GLYCOSYLATED HEMOGLOBIN (HGB A1C): Hemoglobin A1C: 5.4 % (ref 4.0–5.6)

## 2024-05-05 LAB — MICROALBUMIN / CREATININE URINE RATIO
Creatinine, Urine: 119 mg/dL (ref 20–275)
Microalb Creat Ratio: 137 mg/g{creat} — ABNORMAL HIGH
Microalb, Ur: 16.3 mg/dL

## 2024-05-05 NOTE — Progress Notes (Signed)
 "  Outpatient Endocrinology Note Dereon Corkery, MD  05/05/24  Patient's Name: Mindy Ellis    DOB: [birth year]    MRN: [id number]                                                    REASON OF VISIT: Follow-up of type 2 diabetes mellitus  REFERRING PROVIDER:  Marvine Rush, MD  PCP: Marvine Rush, MD  HISTORY OF PRESENT ILLNESS:   Mindy Ellis is a 74 y.o. old female with past medical history listed below, is here for follow-up of type 2 diabetes mellitus.   Pertinent Diabetes History: Patient was diagnosed with type 2 diabetes mellitus around 2010. She takes oral and injectable steroid frequently for back pain/sciatic pain, worsening her diabetes control.  She has no personal history of pancreatitis, no family history of medullary thyroid  cancer and MEM 2 syndrome.  Chronic Diabetes Complications : Retinopathy: no. Last ophthalmology exam was done on annually, reportedly. Nephropathy: CKD Peripheral neuropathy: yes ?, gabapentin . Coronary artery disease: no Stroke: no  Relevant comorbidities and cardiovascular risk factors: Obesity: yes Body mass index is 27.56 kg/m.  Hypertension: yes Hyperlipidemia. Yes, on a statin.  Current / Home Diabetic regimen includes: Mounjaro  10 mg weekly.  Mounjaro  was started in July, 2024.  Prior diabetic medications: Metformin Januvia Glimepiride stopped in December 2024 due to improvement of diabetes control and hypoglycemia.  Glycemic data:   Accu-Chek guide glucometer data, download reviewed, fasting blood sugar acceptable with some of the blood sugar 104, 98, 99.  Hypoglycemia: Patient has no hypoglycemic episodes. Patient has hypoglycemia awareness.  Factors modifying glucose control: 1.  Diabetic diet assessment: 3 meals a day.  2.  Staying active or exercising: Limited physical activity, no formal exercise due to sciatic pain and back pain.  3.  Medication compliance: compliant all of the time.  # Thyroid   nodules: Ultrasound thyroid  in January 2023 assistant with heterogeneous multinodular thyroid  gland with bilateral thyroid  nodules mostly spongiform and complex.  Right spongiform nodule measuring 2.2 cm, isthmus is spongiform nodule measuring 1.5 cm, left complex nodule measuring 2.2 cm and left spongiform nodule measuring 1.4 cm.  Stable compared to ultrasound in July 2021.  # Low TSH Outside lab records reviewed, TSH 0. 415 in 05/2022 (reference range0.450 - 4.500), mildly low TSH at that time, she had normal TSH in the past 0.462.  Patient denies complaints of palpitation, heat intolerance.  No change in bowel habit.  No weight loss.  No neck discomfort or dysphagia.  No redness or watering of the eyes.  Family history of thyroid  disorder in mother.  Repeat thyroid  function test in September 2024 on initial visit was normal as follows.   Latest Reference Range & Units 12/17/22 10:32  TSH 0.35 - 5.50 uIU/mL 0.78  Triiodothyronine,Free,Serum 2.3 - 4.2 pg/mL 3.5  T4,Free(Direct) 0.60 - 1.60 ng/dL 9.20    Interval history Hemoglobin A1c 5.4%.  Glucometer data as reviewed above mostly acceptable blood sugar.  She has been taking Mounjaro  tolerating well, except occasional nausea 1 or 2 days after injection.  She lost about 10 pounds of weight since last visit.  Patient reports she had laboratory workup recently probably including thyroid  function testing, lipase level, CMP were normal.  No results available to review.  No other complaints today.  REVIEW OF SYSTEMS As  per history of present illness.   PAST MEDICAL HISTORY: Past Medical History:  Diagnosis Date   Abdominal pain    Acid reflux    Chronic diarrhea    DDD (degenerative disc disease)    Depression    Diabetes mellitus without complication (HCC)    diet controlled   Dysphagia    Fibromyalgia    GERD (gastroesophageal reflux disease)    Hypertension    Meningioma (HCC)    PONV (postoperative nausea and vomiting)    nausea,  pt must not vomit due to reflux surgery    PAST SURGICAL HISTORY: Past Surgical History:  Procedure Laterality Date   ABDOMINAL HYSTERECTOMY  age 40   ANTERIOR AND POSTERIOR REPAIR N/A 01/18/2013   Procedure: ANTERIOR (CYSTOCELE) ;  Surgeon: Glendia DELENA Elizabeth, MD;  Location: [facility];  Service: Urology;  Laterality: N/A;   APPENDECTOMY  age 53   both ovaries removed   BREAST SURGERY Left 21 yrs ago   benign lump removed   CHOLECYSTECTOMY     COLONOSCOPY  11/11/07   NUR   CYSTOSCOPY N/A 01/18/2013   Procedure: CYSTOSCOPY;  Surgeon: Glendia DELENA Elizabeth, MD;  Location: [facility];  Service: Urology;  Laterality: N/A;   EYE SURGERY     lasix eye surgery Bilateral    PROCTOSCOPY  01/18/2013   Procedure: PROCTOSCOPY;  Surgeon: Glendia DELENA Elizabeth, MD;  Location: [facility];  Service: Urology;;   surgery  for acid reflux  1999   TONSILLECTOMY     TRANSFORAMINAL LUMBAR INTERBODY FUSION (TLIF) WITH PEDICLE SCREW FIXATION 1 LEVEL Left 04/04/2020   Procedure: Left Lumbar four-five Transforaminal lumbar interbody fusion;  Surgeon: Unice Pac, MD;  [facility];  Service: Neurosurgery;  Laterality: Left;   UPPER GASTROINTESTINAL ENDOSCOPY  2006    ALLERGIES: Allergies  Allergen Reactions   Latex    Mirtazapine Nausea Only   Naproxen Sodium     Upsets stomach   Rofecoxib    Statins Other (See Comments)    Muscle aches   Tape    Zolpidem Tartrate Other (See Comments)    Ambien= confusion   Elemental Sulfur Rash   Erythromycin Rash   Penicillins Rash   Sulfa Antibiotics Hives and Rash   Tetracyclines & Related Rash    FAMILY HISTORY:  Family History  Problem Relation Age of Onset   Drug abuse Other    Depression Mother    Asthma Mother    COPD Mother    Anxiety disorder Maternal Grandmother    Depression Maternal Grandmother    COPD Sister     SOCIAL HISTORY: Social History   Socioeconomic History   Marital status: Married    Spouse name: Story Conti   Number of  children: 1   Years of education: 12   Highest education level: 12th grade  Occupational History   Not on file  Tobacco Use   Smoking status: Never    Passive exposure: Never   Smokeless tobacco: Never  Vaping Use   Vaping status: Never Used  Substance and Sexual Activity   Alcohol use: Yes    Comment: occ wine   Drug use: No   Sexual activity: Yes    Partners: Male    Birth control/protection: Surgical  Other Topics Concern   Not on file  Social History Narrative   Not on file   Social Drivers of Health   Tobacco Use: Low Risk (05/05/2024)   Patient History    Smoking Tobacco Use:  Never    Smokeless Tobacco Use: Never    Passive Exposure: Never  Financial Resource Strain: Low Risk (11/14/2021)   Overall Financial Resource Strain (CARDIA)    Difficulty of Paying Living Expenses: Not hard at all  Food Insecurity: No Food Insecurity (11/14/2021)   Hunger Vital Sign    Worried About Running Out of Food in the Last Year: Never true    Ran Out of Food in the Last Year: Never true  Transportation Needs: No Transportation Needs (11/14/2021)   PRAPARE - Administrator, Civil Service (Medical): No    Lack of Transportation (Non-Medical): No  Physical Activity: Insufficiently Active (11/14/2021)   Exercise Vital Sign    Days of Exercise per Week: 3 days    Minutes of Exercise per Session: 30 min  Stress: No Stress Concern Present (11/14/2021)   [employer] of Occupational Health - Occupational Stress Questionnaire    Feeling of Stress : Only a little  Social Connections: Socially Integrated (11/14/2021)   Social Connection and Isolation Panel    Frequency of Communication with Friends and Family: More than three times a week    Frequency of Social Gatherings with Friends and Family: More than three times a week    Attends Religious Services: More than 4 times per year    Active Member of Clubs or Organizations: Yes    Attends Banker Meetings: More  than 4 times per year    Marital Status: Married  Depression (PHQ2-9): Low Risk (11/14/2021)   Depression (PHQ2-9)    PHQ-2 Score: 0  Alcohol Screen: Low Risk (11/14/2021)   Alcohol Screen    Last Alcohol Screening Score (AUDIT): 1  Housing: Low Risk (11/14/2021)   Housing    Last Housing Risk Score: 0  Utilities: Not on file  Health Literacy: Not on file    MEDICATIONS:  Current Outpatient Medications  Medication Sig Dispense Refill   Blood Glucose Monitoring Suppl DEVI 1 each by Does not apply route in the morning, at noon, and at bedtime. May substitute to any manufacturer covered by patient's insurance. 1 each 0   Calcium  Carbonate-Vitamin D  (CALCIUM  + D PO) Take 600 mg by mouth daily.     cholecalciferol  (VITAMIN D3) 25 MCG (1000 UNIT) tablet Take 2,000 Units by mouth daily.     diltiazem  (CARDIZEM  CD) 180 MG 24 hr capsule Take 180 mg by mouth daily.     gabapentin  (NEURONTIN ) 300 MG capsule TAKE 1 CAPSULE BY MOUTH THREE TIMES DAILY 90 capsule 0   MAGNESIUM  PO Take by mouth.     rosuvastatin (CRESTOR) 5 MG tablet Take 5 mg by mouth daily.     tirzepatide  (MOUNJARO ) 10 MG/0.5ML Pen Inject 10 mg into the skin once a week. 6 mL 3   traZODone  (DESYREL ) 100 MG tablet Take 100 mg by mouth at bedtime.     amitriptyline  (ELAVIL ) 50 MG tablet Take 50 mg by mouth at bedtime.     NEOMYCIN -POLYMYXIN-HYDROCORTISONE (CORTISPORIN) 1 % SOLN OTIC solution Apply 1-2 drops to toe BID after soaking 10 mL 1   No current facility-administered medications for this visit.    PHYSICAL EXAM: Vitals:   05/05/24 1305  BP: 132/72  Pulse: 89  SpO2: 94%  Weight: 155 lb 9.6 oz (70.6 kg)  Height: 5' 3 (1.6 m)     Body mass index is 27.56 kg/m.  Wt Readings from Last 3 Encounters:  05/05/24 155 lb 9.6 oz (70.6 kg)  11/10/23  165 lb 9.6 oz (75.1 kg)  06/18/23 168 lb 12.8 oz (76.6 kg)    General: Well developed, well nourished female in no apparent distress.  HEENT: AT/[place], no external lesions.   Eyes: Conjunctiva clear and no icterus. Neck: Neck supple , no thyromegaly, no palpable thyroid  nodule Lungs: Respirations not labored Neurologic: Alert, oriented, normal speech, DTR 2+ Extremities / Skin: Dry.   Psychiatric: Does not appear depressed or anxious  Diabetic Foot Exam - Simple   Simple Foot Form Diabetic Foot exam was performed with the following findings: Yes 05/05/2024  1:36 PM  Visual Inspection No deformities, no ulcerations, no other skin breakdown bilaterally: Yes Sensation Testing Intact to touch and monofilament testing bilaterally: Yes Pulse Check Posterior Tibialis and Dorsalis pulse intact bilaterally: Yes Comments     LABS Reviewed Lab Results  Component Value Date   HGBA1C 5.4 05/05/2024   HGBA1C 5.9 (A) 11/10/2023   HGBA1C 5.7 (A) 06/18/2023   No results found for: FRUCTOSAMINE No results found for: CHOL, HDL, LDLCALC, LDLDIRECT, TRIG, CHOLHDL Lab Results  Component Value Date   MICRALBCREAT 18 03/19/2023   Lab Results  Component Value Date   CREATININE 1.02 (H) 07/12/2022   No results found for: GFR  ASSESSMENT / PLAN  1. Type 2 diabetes mellitus without complication, without long-term current use of insulin  (HCC)   2. Multiple thyroid  nodules   3. Over weight     Diabetes Mellitus type 2, complicated by no known complications. - Diabetic status / severity: Controlled  Lab Results  Component Value Date   HGBA1C 5.4 05/05/2024    - Hemoglobin A1c goal : <6.5%  She has controlled type 2 diabetes mellitus.  Mounjaro  will also help with her weight loss.  - Medications: See below  I) continue Mounjaro  10 mg weekly.   - Home glucose testing: In the morning fasting and occasionally other times of the day.  - Discussed/ Gave Hypoglycemia treatment plan.  # Consult : not required at this time.   # Annual urine for microalbuminuria/ creatinine ratio, no microalbuminuria currently.. Last  Lab Results  Component  Value Date   MICRALBCREAT 18 03/19/2023    # Foot check nightly. ?  Diabetic neuropathy, on gabapentin  by primary care provider, ?  Podiatry.  # Annual dilated diabetic eye exams.   - Diet: Make healthy diabetic food choices - Life style / activity / exercise: Discussed.  2. Blood pressure  -  BP Readings from Last 1 Encounters:  05/05/24 132/72    - Control is in target.  - No change in current plans.  3. Lipid status / Hyperlipidemia - Last No results found for: LDLCALC LDL 111. - Continue Crestor 5 mg daily.  Managed by primary care provider.  # Multiple thyroid  nodules -She has bilateral multiple thyroid  nodules, ultrasound thyroid  in July 2021 and January 2023 showed mostly spongiform and complex thyroid  nodule.  No worrisome features.  No indication of needle biopsy at this time.  These nodules needs monitoring with serial ultrasound.   - Will plan for ultrasound thyroid  after next follow-up visit.  Henrietta was seen today for diabetes.  Diagnoses and all orders for this visit:  Type 2 diabetes mellitus without complication, without long-term current use of insulin  (HCC) -     POCT HgB A1C -     Microalbumin / creatinine urine ratio  Multiple thyroid  nodules  Over weight    DISPOSITION Follow up in clinic in 5 months suggested.  Labs on  the same day of the visit.   All questions answered and patient verbalized understanding of the plan.  Nashaly Dorantes, MD [facility] [address] [place], [address] [place], [place] [address] Phone # [phone]  At least part of this note was generated using voice recognition software. Inadvertent word errors may have occurred, which were not recognized during the proofreading process. "

## 2024-10-03 ENCOUNTER — Ambulatory Visit: Admitting: Endocrinology
